# Patient Record
Sex: Female | Born: 1935
Health system: Southern US, Community
[De-identification: ages and names within clinical notes are randomized; demographics above are authoritative.]

## PROBLEM LIST (undated history)

## (undated) DIAGNOSIS — F419 Anxiety disorder, unspecified: Secondary | ICD-10-CM

## (undated) DIAGNOSIS — I10 Essential (primary) hypertension: Secondary | ICD-10-CM

## (undated) DIAGNOSIS — G8929 Other chronic pain: Secondary | ICD-10-CM

## (undated) DIAGNOSIS — Z8619 Personal history of other infectious and parasitic diseases: Secondary | ICD-10-CM

## (undated) DIAGNOSIS — E785 Hyperlipidemia, unspecified: Secondary | ICD-10-CM

## (undated) DIAGNOSIS — M549 Dorsalgia, unspecified: Secondary | ICD-10-CM

## (undated) DIAGNOSIS — Z9289 Personal history of other medical treatment: Secondary | ICD-10-CM

## (undated) DIAGNOSIS — Z8669 Personal history of other diseases of the nervous system and sense organs: Secondary | ICD-10-CM

## (undated) DIAGNOSIS — R351 Nocturia: Secondary | ICD-10-CM

## (undated) DIAGNOSIS — E039 Hypothyroidism, unspecified: Secondary | ICD-10-CM

## (undated) DIAGNOSIS — M1990 Unspecified osteoarthritis, unspecified site: Secondary | ICD-10-CM

## (undated) DIAGNOSIS — M2550 Pain in unspecified joint: Secondary | ICD-10-CM

## (undated) HISTORY — PX: ABDOMINAL HYSTERECTOMY: SHX81

## (undated) HISTORY — PX: BACK SURGERY: SHX140

## (undated) HISTORY — PX: OTHER SURGICAL HISTORY: SHX169

## (undated) HISTORY — PX: COLONOSCOPY: SHX174

---

## 1999-10-31 ENCOUNTER — Encounter: Payer: Self-pay | Admitting: Internal Medicine

## 1999-10-31 ENCOUNTER — Encounter: Admission: RE | Admit: 1999-10-31 | Discharge: 1999-10-31 | Payer: Self-pay | Admitting: Internal Medicine

## 2000-07-17 ENCOUNTER — Encounter: Admission: RE | Admit: 2000-07-17 | Discharge: 2000-09-26 | Payer: Self-pay | Admitting: Anesthesiology

## 2001-05-25 ENCOUNTER — Encounter: Payer: Self-pay | Admitting: Internal Medicine

## 2001-05-25 ENCOUNTER — Encounter: Admission: RE | Admit: 2001-05-25 | Discharge: 2001-05-25 | Payer: Self-pay | Admitting: Internal Medicine

## 2001-06-01 ENCOUNTER — Encounter: Payer: Self-pay | Admitting: Internal Medicine

## 2001-06-01 ENCOUNTER — Encounter: Admission: RE | Admit: 2001-06-01 | Discharge: 2001-06-01 | Payer: Self-pay | Admitting: Internal Medicine

## 2001-11-08 ENCOUNTER — Ambulatory Visit (HOSPITAL_COMMUNITY): Admission: RE | Admit: 2001-11-08 | Discharge: 2001-11-08 | Payer: Self-pay | Admitting: *Deleted

## 2002-06-23 ENCOUNTER — Encounter: Payer: Self-pay | Admitting: Internal Medicine

## 2002-06-23 ENCOUNTER — Encounter: Admission: RE | Admit: 2002-06-23 | Discharge: 2002-06-23 | Payer: Self-pay | Admitting: Internal Medicine

## 2002-10-07 ENCOUNTER — Encounter: Payer: Self-pay | Admitting: Internal Medicine

## 2002-10-07 ENCOUNTER — Encounter: Admission: RE | Admit: 2002-10-07 | Discharge: 2002-10-07 | Payer: Self-pay | Admitting: Internal Medicine

## 2003-01-09 ENCOUNTER — Encounter
Admission: RE | Admit: 2003-01-09 | Discharge: 2003-04-09 | Payer: Self-pay | Admitting: Physical Medicine & Rehabilitation

## 2003-10-11 ENCOUNTER — Encounter: Admission: RE | Admit: 2003-10-11 | Discharge: 2003-10-11 | Payer: Self-pay | Admitting: Internal Medicine

## 2004-10-07 ENCOUNTER — Encounter: Admission: RE | Admit: 2004-10-07 | Discharge: 2004-10-07 | Payer: Self-pay | Admitting: Internal Medicine

## 2004-10-11 ENCOUNTER — Encounter: Admission: RE | Admit: 2004-10-11 | Discharge: 2004-10-11 | Payer: Self-pay | Admitting: Internal Medicine

## 2004-11-05 ENCOUNTER — Encounter: Admission: RE | Admit: 2004-11-05 | Discharge: 2004-11-05 | Payer: Self-pay | Admitting: Obstetrics and Gynecology

## 2005-11-07 ENCOUNTER — Encounter: Admission: RE | Admit: 2005-11-07 | Discharge: 2005-11-07 | Payer: Self-pay | Admitting: Internal Medicine

## 2006-11-11 ENCOUNTER — Encounter: Admission: RE | Admit: 2006-11-11 | Discharge: 2006-11-11 | Payer: Self-pay | Admitting: Internal Medicine

## 2007-11-12 ENCOUNTER — Encounter: Admission: RE | Admit: 2007-11-12 | Discharge: 2007-11-12 | Payer: Self-pay | Admitting: Internal Medicine

## 2008-11-13 ENCOUNTER — Encounter: Admission: RE | Admit: 2008-11-13 | Discharge: 2008-11-13 | Payer: Self-pay | Admitting: Internal Medicine

## 2009-11-13 ENCOUNTER — Encounter: Admission: RE | Admit: 2009-11-13 | Discharge: 2009-11-13 | Payer: Self-pay | Admitting: Internal Medicine

## 2010-06-14 NOTE — Procedures (Signed)
[facility]  Patient:    Valerie Davenport, Valerie Davenport                        MRN: [id number] Proc. Date: 07/21/00 Adm. Date:  [id number] Attending:  Thyra Breed CC:         Tanya Nones. Jeral Fruit, M.D.  Antony Madura, M.D.   Procedure Report  PROCEDURE:  Lumbar epidural steroid injection.  DIAGNOSIS:  L5 radiculopathy with underlying epidural scarring.  ANESTHESIOLOGIST:  Thyra Breed, M.D.  INTERVAL HISTORY:  The patient was seen earlier this week and at that point was uncertain whether she wanted any interventions performed.  She spoke with her sister who is a former patient of mine, and she felt comfortable in returning for an epidural steroid injection today.  She had no questions with regard to potential risk, having had these reviewed at her last visit.  PHYSICAL EXAMINATION:  VITAL SIGNS:  Blood pressure 163/81, heart rate 87, respiratory rate 18, O2 saturation 96%, pain level 3/10.  NEUROLOGIC:  Exam is unchanged from two days ago.  DESCRIPTION OF PROCEDURE:  After informed consent was obtained, the patient was placed in the sitting position and monitored.  Her back was prepped with Betadine x 3.  A skin wheal was raised at the L3-4 interspace with 1% lidocaine.  A 20-gauge Tuohy needle was introduced in the lumbar epidural space to loss of resistance to preservative free normal saline.  There was no CSF nor blood.  The depth was 4.5 cm.  I injected 40 mg of Medrol and 8 ml preservative free normal saline.  The patient tolerated this well.  POSTPROCEDURE CONDITION:  Stable.  DISCHARGE INSTRUCTIONS: 1. Resume previous diet. 2. Limitation of activities per instruction sheet. 3. Continue on current medications. 4. Follow up with me in one to two weeks for repeat injection. DD:  07/21/00 TD:  07/21/00 Job: [id number]

## 2010-06-14 NOTE — Procedures (Signed)
[facility]  Patient:    Valerie Davenport, Valerie Davenport                        MRN: [id number] Proc. Date: 07/28/00 Adm. Date:  [id number] Attending:  Thyra Breed CC:         Antony Madura, M.D.  Stefani Dama, M.D.   Procedure Report  PROCEDURE:  Lumbar epidural steroid injection.  DIAGNOSIS:  L5 radiculopathy.  INTERVAL HISTORY:  The patient has noted overall improvement after her first epidural steroid injections.  She is pleased that she went ahead and proceeded with this intervention.  She is back for her second today.  She has minimal untoward effects to this.  PHYSICAL EXAMINATION:  Blood pressure 152/76, heart rate 79, respiratory rate 16, O2 saturations 97%.  Pain level is 0/10 this morning.  Her back shows good healing.  DESCRIPTION OF PROCEDURE:  After informed consent was obtained, the patient was placed in the sitting position and monitored.  Her back was prepped with Betadine x 3.  A skin wheal was raised at the L3-4 interspace with 1% lidocaine.  A 20 gauge Tuohy needle was introduced to the lumbar epidural space to loss of resistance to preservative-free normal saline.  There was no CSF nor blood.  The depth was 4.5 cm.  I injected 40 mg of Medrol with 8 mL of preservative-free normal saline.  The needle was flushed with preservative-free normal saline and removed intact.  Postprocedure condition - stable.  DISCHARGE INSTRUCTIONS: 1. Resume previous diet. 2. Limitations on activities per instruction sheet. 3. Continue on current medications. 4. Follow up with me in 1-2 weeks for a repeat injection.  She was given a    work excuse for today and tomorrow. DD:  07/28/00 TD:  07/28/00 Job: [id number]

## 2010-06-14 NOTE — H&P (Signed)
[facility]  Patient:    Valerie Davenport, Valerie Davenport                        MRN: [id number] Adm. Date:  [id number] Attending:  Thyra Breed CC:         Stefani Dama, M.D.  Antony Madura, M.D.   History and Physical  NEW PATIENT EVALUATION:  Valerie Davenport is a very pleasant 77 year old, who is sent to [place] by Dr. Barnett Abu for either epidural steroid injections or selective nerve root block for a L5 radiculopathy.  The patient states that she dates her current complaints from about six months ago when she developed left dorsal foot numbness and left hip discomfort.  She was initially seen by Dr. Priscille Kluver who injected her left heel and then her left hip with no sustained improvements about two to three months ago.  She was sent to Dr. Danielle Dess, who apparently had seen her back in 1994, with similar complaints.  He reassessed her MRI that was performed in April, which showed ongoing scarring around the left SI nerve root and some osteophytosis and chronic disk bulge at L5-S1 with some left foraminal stenosis.  He did not feel like he had any surgical options for her and sent her for medical pain management.  The patient complains of numbness and tingling to the left lower extremity, a foot drop which has been present for quite sometime, but intact bowel and bladder control.  She describes her pain as a sense that something is being stuck into her left hip and just numbness out to her lower extremity.  This is made worse by being up on her feet for long periods of time and improved by Tylenol.  She has been treated with Tylenol and the injection therapy.  In the past, when she was seeing Dr. Darrelyn Hillock, who apparently operated on her in 1987, a AFO was avocated, but she apparently has not been consistent about wearing these.  CURRENT MEDICATIONS:  Premarin, Pravachol, enalapril, Norvasc, Levoxyl, and Tylenol.  ALLERGIES:  No known drug allergies.  FAMILY  HISTORY:  Positive for back problems, hypertension, coronary artery disease, and kidney problems.  ACTIVE MEDICAL PROBLEMS:  Hypertension, hypothyroidism, hypercholesterolemia, and history of migraines.  SOCIAL HISTORY:  The patient is a nonsmoker and nondrinker.  She works in [employer].  PAST SURGICAL HISTORY:  Significant for her back surgery.  REVIEW OF SYSTEMS:  GENERAL: Negative.  HEAD: Significant for migraine headaches, with the last one occurring two months ago.  EYES: Significant for corrective lenses.  NOSE/MOUTH/THROAT: Negative.  EARS: Negative.  PULMONARY: Negative.  CARDIOVASCULAR: Significant for hypertension.  GI: Significant for diarrhea.  GU: Urinary frequency.  MUSCULOSKELETAL: See HPI.  NEUROLOGICAL: No history of seizure or stroke.  HEMATOLOGIC: Negative.  CUTANEOUS: Negative. ENDOCRINE: Positive for hypothyroidism, negative for diabetes mellitus. PSYCHIATRIC: Positive for situational stress, otherwise negative. ALLERGY/IMMUNOLOGIC: Positive for hayfever-like symptoms.  PHYSICAL EXAMINATION:  VITAL SIGNS:  Blood pressure 143/68, heart rate 77, respirations 18, O2 saturation 95%.  Pain level is 3/10.  Temperature 97.8.  GENERAL:  This is a pleasant anxious female in no acute distress.  HEENT:  HEAD: Normocephalic, atraumatic.  EYES: Extraocular movements intact with conjunctivae and sclerae clear.  NOSE: Patent.  Nares without discharge. OROPHARYNX: Free of lesions.  NECK:  Supple, without lymphadenopathy.  Carotids were 2+ and symmetric without bruits.  LUNGS:  Clear.  HEART:  Regular rate and rhythm.  BREASTS/ABDOMEN/PELVIC/RECTAL:  Exams  not performed.  BACK:  Examination revealed a well-healed surgical scar with negative straight leg raise signs.  EXTREMITIES:  No cyanosis, clubbing, or edema with radial pulses and dorsalis pedis pulses 2+ and symmetric.  She had bony enlargement of the PIPs, DIPs, and first carpal and metacarpal joints of the  hands, as well as the mid tarsal joints of the feet.  NEUROLOGICAL:  The patient was oriented to person, place, time, and reason for visit.  Cranial nerves 2-12 are grossly intact.  Deep tendon reflexes were symmetric in the upper extremities, symmetric at the knees, 2+ at the right ankle, absent at the left ankle.  Plantar reflexes were downgoing.  Motor was significant for intact bulk and tone and strength, except for the left EHL which was 1/5.  Sensory was significant for attenuated pinprick perception and vibratory sense over the dorsum of the left foot.  IMPRESSION: 1. Lumbar radiculopathy into the left lower extremity with previous history    of hemilaminectomy and underlying epidural scarring around the left S1    nerve root with multilevel degenerative disk disease. 2. Other medical problems per Dr. Su Hilt, which include hypertension,    hypothyroidism, hypercholesterolemia, history of migraine headaches, and    hayfever.  DISPOSITION:  I discussed the potential risks, benefits, and limitations of a lumbar epidural steroid injection, as well as selective nerve root block.  The patient had a great deal of hesitancy with regard to these.  She is looking for guarantees.  I discussed pain management with her briefly, but her pain level at present is about 3/10 and I am not sure that we can improve greatly on this.  She seems very responsive to Tylenol on as needed basis.  I offered to see her back in followup when she can make up her mind whether she is interested in proceeding with interventional or pain management modalities. She will give [place] a call. DD:  07/20/00 TD:  07/20/00 Job: [id number]

## 2010-06-14 NOTE — Procedures (Signed)
[facility]  Patient:    TWILA, RAPPA                        MRN: [id number] Proc. Date: 08/06/00 Adm. Date:  [id number] Attending:  Thyra Breed CC:         Stefani Dama, M.D.  Antony Madura, M.D.   Procedure Report  PREOPERATIVE DIAGNOSIS:  L5 radiculopathy with underlying lumbar degenerative disk disease.  POSTOPERATIVE DIAGNOSIS:  L5 radiculopathy with underlying lumbar degenerative disk disease.  PROCEDURE:  Lumbar epidural steroid injection.  SURGEON:  Thyra Breed, M.D.  INTERVAL HISTORY:  The patient has marked improvement with her back pain overall.  She continues to have some residual foot discomfort, but is very pleased with her progress.  She rates her pain at 0 out of 10.  MEDICATIONS:  Are unchanged.  PHYSICAL EXAMINATION:  Blood pressure 135/90, heart rate 72, respiratory rate is 18, and O2 saturations 97%.  She shows good healing of her back.  DESCRIPTION OF PROCEDURE:  After informed consent was obtained, the patient was placed in the sitting position and monitored.  Her back was prepped with Betadine x 3.  The skin level was raised at the L3-4 interspace with 1% lidocaine.  A 20-gauge Tuohy needle was introduced into the lumbar epidural space with loss of resistance to preservative-free normal saline.  There was no CSF nor blood.  Forty milligrams of Medrol and 8 ml of preservative-free normal saline was gently injected.  The needle was flushed with preservative-free normal saline and removed intact.  POSTPROCEDURE CONDITION:  Stable.  DISCHARGE INSTRUCTIONS: 1. Resume previous diet. 2. Limitation and activities per instruction sheet. 3. Continue on current medications. 4. I plan to see the patient back in follow up in three months for a follow up    evaluation. DD:  08/06/00 TD:  08/06/00 Job: [id number]

## 2010-10-09 ENCOUNTER — Other Ambulatory Visit: Payer: Self-pay | Admitting: Internal Medicine

## 2010-10-09 DIAGNOSIS — Z1231 Encounter for screening mammogram for malignant neoplasm of breast: Secondary | ICD-10-CM

## 2010-11-18 ENCOUNTER — Ambulatory Visit
Admission: RE | Admit: 2010-11-18 | Discharge: 2010-11-18 | Disposition: A | Discharge status: Home / Self Care | Payer: Medicare Other | Source: Ambulatory Visit | Attending: Internal Medicine | Admitting: Internal Medicine

## 2010-11-18 DIAGNOSIS — Z1231 Encounter for screening mammogram for malignant neoplasm of breast: Secondary | ICD-10-CM

## 2010-11-20 ENCOUNTER — Other Ambulatory Visit: Payer: Self-pay | Admitting: Internal Medicine

## 2010-11-20 DIAGNOSIS — R928 Other abnormal and inconclusive findings on diagnostic imaging of breast: Secondary | ICD-10-CM

## 2010-12-06 ENCOUNTER — Ambulatory Visit
Admission: RE | Admit: 2010-12-06 | Discharge: 2010-12-06 | Disposition: A | Discharge status: Home / Self Care | Payer: Medicare Other | Source: Ambulatory Visit | Attending: Internal Medicine | Admitting: Internal Medicine

## 2010-12-06 DIAGNOSIS — R928 Other abnormal and inconclusive findings on diagnostic imaging of breast: Secondary | ICD-10-CM

## 2011-05-02 ENCOUNTER — Other Ambulatory Visit: Payer: Self-pay | Admitting: Internal Medicine

## 2011-05-02 DIAGNOSIS — N6009 Solitary cyst of unspecified breast: Secondary | ICD-10-CM

## 2011-05-30 ENCOUNTER — Other Ambulatory Visit: Payer: Medicare Other

## 2011-06-27 ENCOUNTER — Ambulatory Visit
Admission: RE | Admit: 2011-06-27 | Discharge: 2011-06-27 | Disposition: A | Discharge status: Home / Self Care | Payer: Medicare Other | Source: Ambulatory Visit | Attending: Internal Medicine | Admitting: Internal Medicine

## 2011-06-27 DIAGNOSIS — N6009 Solitary cyst of unspecified breast: Secondary | ICD-10-CM

## 2011-10-20 ENCOUNTER — Other Ambulatory Visit: Payer: Self-pay | Admitting: Internal Medicine

## 2011-10-20 DIAGNOSIS — N6009 Solitary cyst of unspecified breast: Secondary | ICD-10-CM

## 2011-12-03 ENCOUNTER — Other Ambulatory Visit: Payer: Self-pay | Admitting: Internal Medicine

## 2011-12-03 ENCOUNTER — Ambulatory Visit
Admission: RE | Admit: 2011-12-03 | Discharge: 2011-12-03 | Disposition: A | Discharge status: Home / Self Care | Payer: Medicare Other | Source: Ambulatory Visit | Attending: Internal Medicine | Admitting: Internal Medicine

## 2011-12-03 DIAGNOSIS — N6009 Solitary cyst of unspecified breast: Secondary | ICD-10-CM

## 2012-02-11 ENCOUNTER — Other Ambulatory Visit (HOSPITAL_COMMUNITY): Payer: Self-pay | Admitting: Orthopaedic Surgery

## 2012-02-11 NOTE — H&P (Signed)
[facility]   A Division of [facility]   [address], [place], [place] [address] Telephone: [phone]  Fax: [phone]     PATIENT: Valerie Davenport, Valerie Davenport   MR#: [id number]  DOB: [birth year]        CHIEF COMPLAINT:  Back and right leg pain with increasing right leg weakness x4 months.   HISTORY OF PRESENT ILLNESS:   A 79 year old female who has been followed by Dr. Prince Rome for problems with ongoing back pain.  She had previous surgery at the L5-S1 level greater than 18 years ago.  She used to work in [employer], retired at around age 89.  She has had back pain, right leg pain with progressive increased weakness with giving way and falling.  She has fallen 3 times in the last month.  She has been using a cane.  She was treated conservatively.  Initially was on Ultram, then started taking Vicodin and now is using Percocet in order to control her pain.  Epidural steroids have been offered but the patient is deathly afraid and states that she is concerned that the epidural will not help her leg weakness.  She is normally followed by Dr. Burton Apley.    CURRENT MEDICATIONS:  Include Xanax 0.25 mg 1 a day, chlorthalidone 25 mg 1 a day, enalapril 20 mg daily, Percocet 3 tablets daily and p.r.n., levothyroxine 0.075 mg 1 p.o. daily.  She previously was on a Medrol Dosepak but is off that now.  Simvastatin 20 mg daily, 1 Bayer aspirin a day.     PAST MEDICAL/SURGICAL HISTORY:  Previous surgeries include her back surgery, hysterectomy in the distant past.    SOCIAL HISTORY:  The patient is married.  She is here with her husband and also her son.  She does not smoke, does not drink.     FAMILY HISTORY:  Positive for hypertension.   REVIEW OF SYSTEMS:  Fourteen-point review of systems positive for arthritis, hypertension, thyroid condition.    PHYSICAL EXAMINATION:  The patient is alert and oriented.  She is 5 feet, 8 inches, 140 pounds.  Extraocular movements  intact.  She is somewhat hard of hearing.  Pulse is 72 and regular.  Lungs are clear to auscultation.  Heart regular rate and rhythm without murmur or gallop.  Abdomen soft, nontender.  She has mild weakness in her quads.  Slow stance gait.  Adductor is strong.  The EHL, anterior tib, are normal on the right side.  She has residual weakness of EHL and anterior tib, 1/2 to 1 grade on the left, which has been present now for 18+ years, since her previous surgery.  Gastroc soleus is strong.  No weakness of the hip flexor.  The patient has a midline incision at 5-1.  No rash over her lumbar region.  Pelvis is level.  Good range of motion of her hips and knees without any effusion.     RADIOGRAPHS/TESTS:  MRI scan is reviewed, which shows HNP, L4-5, with cephalad migration and dural compression, which is on the right side.  She has multifactorial moderate stenosis at this level with a moderate to large sized HNP extruded fragment cephalad.     PLAN:  Due to her progressive weakness, failure of treatment, using cane, increasing narcotic pain medication, which is causing some side effects, failure to respond to Medrol prednisone and with increasing leg weakness and falling, she states she would like to proceed with operative intervention.  The plan would  be a right L4 hemilaminectomy and micro-diskectomy and removal of the free fragment.  She would be in the hospital overnight.  We discussed risks of surgery including spinal fluid leakage, dural tear, recurrent disk rupture.  Procedure discussed, all questions answered.  If the opposite left side appeared tighter, then we could do a complete laminectomy if needed.  All questions answered.     For additional information please see handwritten notes, reports, orders and prescriptions in this chart.      Mark C. Ophelia Charter, M.D.    Auto-Authenticated by Veverly Fells. Ophelia Charter, M.D.

## 2012-02-16 ENCOUNTER — Other Ambulatory Visit (HOSPITAL_COMMUNITY): Payer: Self-pay | Admitting: Orthopaedic Surgery

## 2012-02-18 ENCOUNTER — Encounter (HOSPITAL_COMMUNITY)
Admission: RE | Admit: 2012-02-18 | Discharge: 2012-02-18 | Disposition: A | Discharge status: Home / Self Care | Payer: Medicare Other | Source: Ambulatory Visit | Attending: Orthopaedic Surgery | Admitting: Orthopaedic Surgery

## 2012-02-18 ENCOUNTER — Encounter (HOSPITAL_COMMUNITY): Payer: Self-pay

## 2012-02-18 ENCOUNTER — Encounter (HOSPITAL_COMMUNITY): Payer: Self-pay | Admitting: Pharmacy Technician

## 2012-02-18 HISTORY — DX: Anxiety disorder, unspecified: F41.9

## 2012-02-18 HISTORY — DX: Essential (primary) hypertension: I10

## 2012-02-18 HISTORY — DX: Personal history of other diseases of the nervous system and sense organs: Z86.69

## 2012-02-18 HISTORY — DX: Personal history of other medical treatment: Z92.89

## 2012-02-18 HISTORY — DX: Pain in unspecified joint: M25.50

## 2012-02-18 HISTORY — DX: Nocturia: R35.1

## 2012-02-18 HISTORY — DX: Other chronic pain: G89.29

## 2012-02-18 HISTORY — DX: Hypothyroidism, unspecified: E03.9

## 2012-02-18 HISTORY — DX: Personal history of other infectious and parasitic diseases: Z86.19

## 2012-02-18 HISTORY — DX: Hyperlipidemia, unspecified: E78.5

## 2012-02-18 HISTORY — DX: Unspecified osteoarthritis, unspecified site: M19.90

## 2012-02-18 HISTORY — DX: Dorsalgia, unspecified: M54.9

## 2012-02-18 LAB — CBC
MCV: 91.2 fL (ref 78.0–100.0)
Platelets: 251 10*3/uL (ref 150–400)
RBC: 4.78 MIL/uL (ref 3.87–5.11)
WBC: 9.9 10*3/uL (ref 4.0–10.5)

## 2012-02-18 LAB — URINE MICROSCOPIC-ADD ON

## 2012-02-18 LAB — URINALYSIS, ROUTINE W REFLEX MICROSCOPIC
Glucose, UA: NEGATIVE mg/dL
Ketones, ur: NEGATIVE mg/dL
pH: 5.5 (ref 5.0–8.0)

## 2012-02-18 LAB — COMPREHENSIVE METABOLIC PANEL
ALT: 17 U/L (ref 0–35)
AST: 24 U/L (ref 0–37)
CO2: 27 mEq/L (ref 19–32)
Chloride: 98 mEq/L (ref 96–112)
GFR calc non Af Amer: 46 mL/min — ABNORMAL LOW (ref >90)
Sodium: 138 mEq/L (ref 135–145)
Total Bilirubin: 0.3 mg/dL (ref 0.3–1.2)

## 2012-02-18 NOTE — Pre-Procedure Instructions (Signed)
Valerie Davenport  02/18/2012   Your procedure is scheduled on:  Fri, Jan 24 @ 7:30 AM  Report to [facility] at 5:30 AM.  Call this number if you have problems the morning of surgery: [phone]   Remember:   Do not eat food or drink liquids after midnight.   Take these medicines the morning of surgery with A SIP OF WATER: Synthroid(Levothyroxine) and Pain Pill(if needed)  Do not wear jewelry, make-up or nail polish.  Do not wear lotions, powders, or perfumes. You may wear deodorant.  Do not shave 48 hours prior to surgery.   Do not bring valuables to the hospital.  Contacts, dentures or bridgework may not be worn into surgery.  Leave suitcase in the car. After surgery it may be brought to your room.  For patients admitted to the hospital, checkout time is 11:00 AM the day of  discharge.   Patients discharged the day of surgery will not be allowed to drive  home.    Special Instructions: Shower using CHG 2 nights before surgery and the night before surgery.  If you shower the day of surgery use CHG.  Use special wash - you have one bottle of CHG for all showers.  You should use approximately 1/3 of the bottle for each shower.   Please read over the following fact sheets that you were given: Pain Booklet, Coughing and Deep Breathing, MRSA Information and Surgical Site Infection Prevention

## 2012-02-18 NOTE — Progress Notes (Signed)
Pt doesn't have a cardiologist  Stress test done 2-66yrs ago at Dr.Ronald Roberts-to request report  Denies ever having an echo or heart cath  Dr.Ronald Su Hilt is Medical MD   Denies EKG or CXR being done within past yr

## 2012-02-19 MED ORDER — CEFAZOLIN SODIUM-DEXTROSE 2-3 GM-% IV SOLR
2.0000 g | INTRAVENOUS | Status: AC
  Administered 2012-02-20: 2 g via INTRAVENOUS
  Filled 2012-02-19: qty 50

## 2012-02-19 NOTE — Progress Notes (Signed)
0840   PCP  Office is faxing stress test & last office note to [phone] !!!   DA

## 2012-02-20 ENCOUNTER — Encounter (HOSPITAL_COMMUNITY): Payer: Self-pay | Admitting: General Practice

## 2012-02-20 ENCOUNTER — Observation Stay (HOSPITAL_COMMUNITY)
Admission: RE | Admit: 2012-02-20 | Discharge: 2012-02-21 | Disposition: A | Discharge status: Home / Self Care | Payer: Medicare Other | Source: Ambulatory Visit | Attending: Orthopaedic Surgery | Admitting: Orthopaedic Surgery

## 2012-02-20 ENCOUNTER — Encounter (HOSPITAL_COMMUNITY): Payer: Self-pay | Admitting: Anesthesiology

## 2012-02-20 ENCOUNTER — Inpatient Hospital Stay (HOSPITAL_COMMUNITY): Payer: Medicare Other | Admitting: Anesthesiology

## 2012-02-20 ENCOUNTER — Encounter (HOSPITAL_COMMUNITY)
Admission: RE | Disposition: A | Discharge status: Home / Self Care | Payer: Self-pay | Source: Ambulatory Visit | Attending: Orthopaedic Surgery

## 2012-02-20 ENCOUNTER — Encounter (HOSPITAL_COMMUNITY): Payer: Self-pay

## 2012-02-20 ENCOUNTER — Inpatient Hospital Stay (HOSPITAL_COMMUNITY): Payer: Medicare Other

## 2012-02-20 DIAGNOSIS — K219 Gastro-esophageal reflux disease without esophagitis: Secondary | ICD-10-CM | POA: Insufficient documentation

## 2012-02-20 DIAGNOSIS — F411 Generalized anxiety disorder: Secondary | ICD-10-CM | POA: Insufficient documentation

## 2012-02-20 DIAGNOSIS — Z01818 Encounter for other preprocedural examination: Secondary | ICD-10-CM | POA: Insufficient documentation

## 2012-02-20 DIAGNOSIS — Z01812 Encounter for preprocedural laboratory examination: Secondary | ICD-10-CM | POA: Insufficient documentation

## 2012-02-20 DIAGNOSIS — Z01810 Encounter for preprocedural cardiovascular examination: Secondary | ICD-10-CM | POA: Insufficient documentation

## 2012-02-20 DIAGNOSIS — I10 Essential (primary) hypertension: Secondary | ICD-10-CM | POA: Diagnosis not present

## 2012-02-20 DIAGNOSIS — M5126 Other intervertebral disc displacement, lumbar region: Secondary | ICD-10-CM | POA: Diagnosis not present

## 2012-02-20 DIAGNOSIS — E039 Hypothyroidism, unspecified: Secondary | ICD-10-CM | POA: Insufficient documentation

## 2012-02-20 HISTORY — PX: LUMBAR LAMINECTOMY: SHX95

## 2012-02-20 LAB — URINE CULTURE

## 2012-02-20 SURGERY — MICRODISCECTOMY LUMBAR LAMINECTOMY
Anesthesia: General | Site: Back | Wound class: Clean

## 2012-02-20 MED ORDER — FLEET ENEMA 7-19 GM/118ML RE ENEM: 1.0000 | ENEMA | Freq: Once | RECTAL | Status: AC | PRN

## 2012-02-20 MED ORDER — 0.9 % SODIUM CHLORIDE (POUR BTL) OPTIME
TOPICAL | Status: DC | PRN
  Administered 2012-02-20: 1000 mL

## 2012-02-20 MED ORDER — HYDROCODONE-ACETAMINOPHEN 5-325 MG PO TABS: 1.0000 | ORAL_TABLET | ORAL | Status: DC | PRN

## 2012-02-20 MED ORDER — SODIUM CHLORIDE 0.9 % IJ SOLN: 3.0000 mL | INTRAMUSCULAR | Status: DC | PRN

## 2012-02-20 MED ORDER — SODIUM CHLORIDE 0.9 % IV SOLN: 250.0000 mL | INTRAVENOUS | Status: DC

## 2012-02-20 MED ORDER — ACETAMINOPHEN 325 MG PO TABS: 650.0000 mg | ORAL_TABLET | ORAL | Status: DC | PRN

## 2012-02-20 MED ORDER — FENTANYL CITRATE 0.05 MG/ML IJ SOLN
INTRAMUSCULAR | Status: DC | PRN
  Administered 2012-02-20: 50 ug via INTRAVENOUS
  Administered 2012-02-20: 100 ug via INTRAVENOUS

## 2012-02-20 MED ORDER — ROCURONIUM BROMIDE 100 MG/10ML IV SOLN
INTRAVENOUS | Status: DC | PRN
  Administered 2012-02-20: 40 mg via INTRAVENOUS

## 2012-02-20 MED ORDER — ACETAMINOPHEN 650 MG RE SUPP: 650.0000 mg | RECTAL | Status: DC | PRN

## 2012-02-20 MED ORDER — LACTATED RINGERS IV SOLN
INTRAVENOUS | Status: DC | PRN
  Administered 2012-02-20 (×2): via INTRAVENOUS

## 2012-02-20 MED ORDER — PANTOPRAZOLE SODIUM 40 MG IV SOLR
40.0000 mg | Freq: Every day | INTRAVENOUS | Status: DC
  Filled 2012-02-20: qty 40

## 2012-02-20 MED ORDER — METHOCARBAMOL 100 MG/ML IJ SOLN
500.0000 mg | Freq: Four times a day (QID) | INTRAVENOUS | Status: DC | PRN
  Filled 2012-02-20: qty 5

## 2012-02-20 MED ORDER — BISACODYL 10 MG RE SUPP: 10.0000 mg | Freq: Every day | RECTAL | Status: DC | PRN

## 2012-02-20 MED ORDER — OXYCODONE-ACETAMINOPHEN 5-325 MG PO TABS
ORAL_TABLET | ORAL | Status: AC
  Filled 2012-02-20: qty 2

## 2012-02-20 MED ORDER — LEVOTHYROXINE SODIUM 75 MCG PO TABS
75.0000 ug | ORAL_TABLET | Freq: Every day | ORAL | Status: DC
  Administered 2012-02-21: 75 ug via ORAL
  Filled 2012-02-20 (×2): qty 1

## 2012-02-20 MED ORDER — ONDANSETRON HCL 4 MG/2ML IJ SOLN: 4.0000 mg | INTRAMUSCULAR | Status: DC | PRN

## 2012-02-20 MED ORDER — ONDANSETRON HCL 4 MG/2ML IJ SOLN
INTRAMUSCULAR | Status: DC | PRN
  Administered 2012-02-20: 4 mg via INTRAVENOUS

## 2012-02-20 MED ORDER — METHOCARBAMOL 500 MG PO TABS
ORAL_TABLET | ORAL | Status: AC
  Filled 2012-02-20: qty 1

## 2012-02-20 MED ORDER — CHLORTHALIDONE 25 MG PO TABS
25.0000 mg | ORAL_TABLET | Freq: Every day | ORAL | Status: DC
  Administered 2012-02-21: 25 mg via ORAL
  Filled 2012-02-20 (×2): qty 1

## 2012-02-20 MED ORDER — CEFAZOLIN SODIUM 1-5 GM-% IV SOLN
1.0000 g | Freq: Three times a day (TID) | INTRAVENOUS | Status: AC
  Administered 2012-02-20 (×2): 1 g via INTRAVENOUS
  Filled 2012-02-20 (×2): qty 50

## 2012-02-20 MED ORDER — DOCUSATE SODIUM 100 MG PO CAPS
100.0000 mg | ORAL_CAPSULE | Freq: Two times a day (BID) | ORAL | Status: DC
  Administered 2012-02-20 – 2012-02-21 (×2): 100 mg via ORAL
  Filled 2012-02-20 (×3): qty 1

## 2012-02-20 MED ORDER — KCL IN DEXTROSE-NACL 20-5-0.45 MEQ/L-%-% IV SOLN
INTRAVENOUS | Status: DC
  Administered 2012-02-20: 11:00:00 via INTRAVENOUS
  Filled 2012-02-20 (×3): qty 1000

## 2012-02-20 MED ORDER — PHENYLEPHRINE HCL 10 MG/ML IJ SOLN
INTRAMUSCULAR | Status: DC | PRN
  Administered 2012-02-20 (×4): 80 ug via INTRAVENOUS

## 2012-02-20 MED ORDER — LIDOCAINE HCL (CARDIAC) 20 MG/ML IV SOLN
INTRAVENOUS | Status: DC | PRN
  Administered 2012-02-20: 70 mg via INTRAVENOUS

## 2012-02-20 MED ORDER — HYDROCODONE-ACETAMINOPHEN 5-325 MG PO TABS
1.0000 | ORAL_TABLET | ORAL | Status: DC | PRN
  Administered 2012-02-20: 1 via ORAL
  Filled 2012-02-20: qty 1

## 2012-02-20 MED ORDER — SODIUM CHLORIDE 0.9 % IJ SOLN
3.0000 mL | Freq: Two times a day (BID) | INTRAMUSCULAR | Status: DC
  Administered 2012-02-21: 3 mL via INTRAVENOUS

## 2012-02-20 MED ORDER — ASPIRIN EC 81 MG PO TBEC
81.0000 mg | DELAYED_RELEASE_TABLET | Freq: Every day | ORAL | Status: DC
  Administered 2012-02-21: 81 mg via ORAL
  Filled 2012-02-20: qty 1

## 2012-02-20 MED ORDER — ENALAPRIL MALEATE 20 MG PO TABS
20.0000 mg | ORAL_TABLET | Freq: Every day | ORAL | Status: DC
  Administered 2012-02-21: 20 mg via ORAL
  Filled 2012-02-20 (×2): qty 1

## 2012-02-20 MED ORDER — OXYCODONE-ACETAMINOPHEN 5-325 MG PO TABS
1.0000 | ORAL_TABLET | ORAL | Status: DC | PRN
Start: 2012-02-20 — End: 2012-02-21
  Administered 2012-02-20 (×2): 2 via ORAL
  Administered 2012-02-21: 1 via ORAL
  Filled 2012-02-20: qty 2
  Filled 2012-02-20: qty 1

## 2012-02-20 MED ORDER — MORPHINE SULFATE 2 MG/ML IJ SOLN
INTRAMUSCULAR | Status: AC
  Filled 2012-02-20: qty 1

## 2012-02-20 MED ORDER — METHOCARBAMOL 500 MG PO TABS
500.0000 mg | ORAL_TABLET | Freq: Four times a day (QID) | ORAL | Status: DC | PRN
  Administered 2012-02-20 – 2012-02-21 (×2): 500 mg via ORAL
  Filled 2012-02-20: qty 1

## 2012-02-20 MED ORDER — MIDAZOLAM HCL 5 MG/5ML IJ SOLN
INTRAMUSCULAR | Status: DC | PRN
  Administered 2012-02-20: 1 mg via INTRAVENOUS

## 2012-02-20 MED ORDER — SIMVASTATIN 20 MG PO TABS
20.0000 mg | ORAL_TABLET | Freq: Every evening | ORAL | Status: DC
  Administered 2012-02-20: 20 mg via ORAL
  Filled 2012-02-20 (×2): qty 1

## 2012-02-20 MED ORDER — KETOROLAC TROMETHAMINE 30 MG/ML IJ SOLN: 30.0000 mg | Freq: Once | INTRAMUSCULAR | Status: DC

## 2012-02-20 MED ORDER — HYDROMORPHONE HCL PF 1 MG/ML IJ SOLN: 0.2500 mg | INTRAMUSCULAR | Status: DC | PRN

## 2012-02-20 MED ORDER — PANTOPRAZOLE SODIUM 40 MG PO TBEC
40.0000 mg | DELAYED_RELEASE_TABLET | Freq: Every day | ORAL | Status: DC
  Administered 2012-02-20 – 2012-02-21 (×2): 40 mg via ORAL
  Filled 2012-02-20 (×2): qty 1

## 2012-02-20 MED ORDER — PHENOL 1.4 % MT LIQD: 1.0000 | OROMUCOSAL | Status: DC | PRN

## 2012-02-20 MED ORDER — MORPHINE SULFATE 2 MG/ML IJ SOLN: 1.0000 mg | INTRAMUSCULAR | Status: DC | PRN

## 2012-02-20 MED ORDER — MENTHOL 3 MG MT LOZG: 1.0000 | LOZENGE | OROMUCOSAL | Status: DC | PRN

## 2012-02-20 MED ORDER — NEOSTIGMINE METHYLSULFATE 1 MG/ML IJ SOLN
INTRAMUSCULAR | Status: DC | PRN
  Administered 2012-02-20: 3 mg via INTRAVENOUS

## 2012-02-20 MED ORDER — PROPOFOL 10 MG/ML IV BOLUS
INTRAVENOUS | Status: DC | PRN
  Administered 2012-02-20: 170 mg via INTRAVENOUS

## 2012-02-20 MED ORDER — GLYCOPYRROLATE 0.2 MG/ML IJ SOLN
INTRAMUSCULAR | Status: DC | PRN
  Administered 2012-02-20: 0.4 mg via INTRAVENOUS

## 2012-02-20 MED ORDER — SENNOSIDES-DOCUSATE SODIUM 8.6-50 MG PO TABS: 1.0000 | ORAL_TABLET | Freq: Every evening | ORAL | Status: DC | PRN

## 2012-02-20 SURGICAL SUPPLY — 46 items
BENZOIN TINCTURE PRP APPL 2/3 (GAUZE/BANDAGES/DRESSINGS) ×2 IMPLANT
BUR ROUND FLUTED 4 SOFT TCH (BURR) IMPLANT
CLOTH BEACON ORANGE TIMEOUT ST (SAFETY) ×2 IMPLANT
CORDS BIPOLAR (ELECTRODE) ×2 IMPLANT
COVER SURGICAL LIGHT HANDLE (MISCELLANEOUS) ×2 IMPLANT
DERMABOND ADHESIVE PROPEN (GAUZE/BANDAGES/DRESSINGS) ×1
DERMABOND ADVANCED .7 DNX6 (GAUZE/BANDAGES/DRESSINGS) ×1 IMPLANT
DRAPE MICROSCOPE LEICA (MISCELLANEOUS) ×2 IMPLANT
DRAPE PROXIMA HALF (DRAPES) ×4 IMPLANT
DRSG EMULSION OIL 3X3 NADH (GAUZE/BANDAGES/DRESSINGS) ×2 IMPLANT
DRSG MEPILEX BORDER 4X8 (GAUZE/BANDAGES/DRESSINGS) ×2 IMPLANT
DURAPREP 26ML APPLICATOR (WOUND CARE) ×2 IMPLANT
ELECT REM PT RETURN 9FT ADLT (ELECTROSURGICAL) ×2
ELECTRODE REM PT RTRN 9FT ADLT (ELECTROSURGICAL) ×1 IMPLANT
GLOVE BIOGEL PI IND STRL 7.5 (GLOVE) ×1 IMPLANT
GLOVE BIOGEL PI IND STRL 8 (GLOVE) ×1 IMPLANT
GLOVE BIOGEL PI INDICATOR 7.5 (GLOVE) ×1
GLOVE BIOGEL PI INDICATOR 8 (GLOVE) ×1
GLOVE ECLIPSE 7.0 STRL STRAW (GLOVE) ×2 IMPLANT
GLOVE ORTHO TXT STRL SZ7.5 (GLOVE) ×2 IMPLANT
GOWN PREVENTION PLUS LG XLONG (DISPOSABLE) IMPLANT
GOWN STRL NON-REIN LRG LVL3 (GOWN DISPOSABLE) ×6 IMPLANT
KIT BASIN OR (CUSTOM PROCEDURE TRAY) ×2 IMPLANT
KIT ROOM TURNOVER OR (KITS) ×2 IMPLANT
MANIFOLD NEPTUNE II (INSTRUMENTS) ×2 IMPLANT
NDL SUT .5 MAYO 1.404X.05X (NEEDLE) ×1 IMPLANT
NEEDLE HYPO 25GX1X1/2 BEV (NEEDLE) ×2 IMPLANT
NEEDLE MAYO TAPER (NEEDLE) ×1
NEEDLE SPNL 18GX3.5 QUINCKE PK (NEEDLE) ×2 IMPLANT
NS IRRIG 1000ML POUR BTL (IV SOLUTION) ×2 IMPLANT
PACK LAMINECTOMY ORTHO (CUSTOM PROCEDURE TRAY) ×2 IMPLANT
PAD ARMBOARD 7.5X6 YLW CONV (MISCELLANEOUS) ×4 IMPLANT
PATTIES SURGICAL .5 X.5 (GAUZE/BANDAGES/DRESSINGS) ×2 IMPLANT
PATTIES SURGICAL .75X.75 (GAUZE/BANDAGES/DRESSINGS) IMPLANT
SPONGE GAUZE 4X4 12PLY (GAUZE/BANDAGES/DRESSINGS) ×2 IMPLANT
STRIP CLOSURE SKIN 1/2X4 (GAUZE/BANDAGES/DRESSINGS) IMPLANT
SUT VIC AB 2-0 CT1 27 (SUTURE) ×1
SUT VIC AB 2-0 CT1 TAPERPNT 27 (SUTURE) ×1 IMPLANT
SUT VICRYL 0 TIES 12 18 (SUTURE) ×2 IMPLANT
SUT VICRYL 4-0 PS2 18IN ABS (SUTURE) IMPLANT
SUT VICRYL AB 2 0 TIES (SUTURE) ×2 IMPLANT
SYR 20ML ECCENTRIC (SYRINGE) IMPLANT
SYR CONTROL 10ML LL (SYRINGE) ×2 IMPLANT
TOWEL OR 17X24 6PK STRL BLUE (TOWEL DISPOSABLE) ×2 IMPLANT
TOWEL OR 17X26 10 PK STRL BLUE (TOWEL DISPOSABLE) ×2 IMPLANT
WATER STERILE IRR 1000ML POUR (IV SOLUTION) ×2 IMPLANT

## 2012-02-20 NOTE — Interval H&P Note (Signed)
History and Physical Interval Note:  02/20/2012 7:11 AM  Valerie Davenport  has presented today for surgery, with the diagnosis of Right L4-5 HNP with free fragment  The various methods of treatment have been discussed with the patient and family. After consideration of risks, benefits and other options for treatment, the patient has consented to  Procedure(s) (LRB) with comments: MICRODISCECTOMY LUMBAR LAMINECTOMY (N/A) - Right L4 hemilaminectomy, microdiscectomy, removal of free fragment as a surgical intervention .  The patient's history has been reviewed, patient examined, no change in status, stable for surgery.  I have reviewed the patient's chart and labs.  Questions were answered to the patient's satisfaction.     Domique Clapper C

## 2012-02-20 NOTE — Transfer of Care (Signed)
Immediate Anesthesia Transfer of Care Note  Patient: Valerie Davenport  Procedure(s) Performed: Procedure(s) (LRB) with comments: MICRODISCECTOMY LUMBAR LAMINECTOMY (N/A) - Right L4 hemilaminectomy, microdiscectomy, removal of free fragment  Patient Location: PACU  Anesthesia Type:General  Level of Consciousness: awake, alert  and patient cooperative  Airway & Oxygen Therapy: Patient Spontanous Breathing and Patient connected to nasal cannula oxygen  Post-op Assessment: Report given to PACU RN, Post -op Vital signs reviewed and stable and Patient moving all extremities X 4  Post vital signs: Reviewed and stable  Complications: No apparent anesthesia complications

## 2012-02-20 NOTE — Anesthesia Preprocedure Evaluation (Addendum)
Anesthesia Evaluation  Patient identified by MRN, date of birth, ID band Patient awake    Reviewed: Allergy & Precautions, H&P , NPO status , Patient's Chart, lab work & pertinent test results  History of Anesthesia Complications Negative for: history of anesthetic complications  Airway Mallampati: II      Dental   Pulmonary  breath sounds clear to auscultation        Cardiovascular hypertension, Pt. on medications Rhythm:Regular Rate:Normal     Neuro/Psych Anxiety    GI/Hepatic GERD-  Controlled,  Endo/Other  Hypothyroidism   Renal/GU      Musculoskeletal   Abdominal   Peds  Hematology   Anesthesia Other Findings   Reproductive/Obstetrics                         Anesthesia Physical Anesthesia Plan  ASA: III  Anesthesia Plan: General   Post-op Pain Management:    Induction: Intravenous  Airway Management Planned: Oral ETT  Additional Equipment:   Intra-op Plan:   Post-operative Plan: Extubation in OR  Informed Consent: I have reviewed the patients History and Physical, chart, labs and discussed the procedure including the risks, benefits and alternatives for the proposed anesthesia with the patient or authorized representative who has indicated his/her understanding and acceptance.   Dental advisory given  Plan Discussed with: CRNA, Surgeon and Anesthesiologist  Anesthesia Plan Comments:       Anesthesia Quick Evaluation

## 2012-02-20 NOTE — Op Note (Signed)
NAMEAIKA, Valerie Davenport                 ACCOUNT NO.:  [id number]  MEDICAL RECORD NO.:  [id number]  LOCATION:  5N04C                        FACILITY:  [facility]  PHYSICIAN:  Aleesia Henney C. Ophelia Charter, M.D.    DATE OF BIRTH:  06/14/1931  DATE OF PROCEDURE:  02/20/2012 DATE OF DISCHARGE:                              OPERATIVE REPORT   PREOPERATIVE DIAGNOSIS:  Right L4-5 herniated nucleus pulposus with cephalad migration and free fragment.  POSTOPERATIVE DIAGNOSIS:  Right L4-5 herniated nucleus pulposus with cephalad migration and free fragment.  PROCEDURE:  Right L4 hemilaminectomy, right L4-5 microdiskectomy, and removal of free fragment.  SURGEON:  Yacoub Diltz C. Ophelia Charter, MD  ASSISTANT:  [name], PA-C, medically necessary and present for the entire procedure.  ANESTHESIA:  GOT plus Marcaine local.  COMPLICATIONS:  None.  INDICATIONS:  This patient has had persistent problems with back pain, failed conservative treatment.  MRI scan shows a large fragment that had ruptured and migrated cephalad into the lateral recess underneath the nerve root causing compression against the pedicle at L4.  The patient had quad weakness and giving way with the falling.  PROCEDURE IN DETAIL:  After induction of general anesthesia, the patient was placed prone, 2 g Ancef was given prophylactically.  Back was prepped with DuraPrep.  Careful padding to the ulnar nerve, shoulder region.  10-15 drape was placed over the sacrum transversely and back was prepped with DuraPrep, they were escorted with towels, sterile skin marker was used on her old incision at L5-S1 and Betadine Steri-Drape applied.  Laminectomy sheet was applied.  Needles were placed at the planned level or decompression, trying to place them just below the L4-5 disk space and deepened to the top portion of the pedicle of L4.  X-ray was taken.  Needles were adjusted.  Second x-ray was taken.  Skin was marked, and after time-out procedure, incision was  made.  Subperiosteal dissection out laterally with Taylor retractor placed and then Kocher clamps placed, adjusted, repeat x-ray taken.  Inferior Kocher clamp was just barely above the disk space at 4-5, that was lowered slightly more caudally and then a second x-ray was taken.  Lamina was __________ bur. Once the x-ray confirmed appropriate level for decompression.  Starting at the inferior aspect of L4 lamina, hemilaminectomy was performed using the bur to thin the lamina and then removing it with 2 and 3 mm Kerrison with operative microscope, draped and brought in.  Thick chunks of ligament were removed.  4-5 disk space was noted, annulus was incised. There was some ligament elevated cephalad and medially above this was a large free fragment causing significant compression.  About half of it was removed initially, being teased out with a ball-tipped nerve hook and removed with the micropituitary.  Additional chunks were removed until there was full decompression.  The posterior aspect of L4 vertebrae body was visualized, lateral wall pedicle was visualized, nerve root coming underneath the pedicle was visualized, and there was no compression anterior to the nerve root after removal of the fragment. Passes were made with micropituitary up and down.  There was minimal disk material left in the disk space that was loose.  Passes anterior  to the dura were made with the hockey stick, dural separator and there were no areas of compression either laterally in the lateral recess up to foramina.  After irrigation with saline solution, deep fascia was closed with #1 Vicryl, 2-0 Vicryl subcutaneous tissue, subcuticular skin closure, postop dressing, and then transferred to recovery room.  The patient tolerated the procedure well.  Wound was irrigated copiously at the end of the case prior to closure.     Marilea Gwynne C. Ophelia Charter, M.D.     MCY/MEDQ  D:  02/20/2012  T:  02/20/2012  Job:  [id number]

## 2012-02-20 NOTE — Progress Notes (Signed)
Pharmacy tech calling pharmacy to complete med rec.

## 2012-02-20 NOTE — Brief Op Note (Cosign Needed)
02/20/2012  8:57 AM  PATIENT:  Valerie Davenport  79 y.o. female  PRE-OPERATIVE DIAGNOSIS:  Right L4-5 HNP with free fragment  POST-OPERATIVE DIAGNOSIS:  Right L4-5 HNP with free fragment  PROCEDURE:  Procedure(s) (LRB) with comments: MICRODISCECTOMY LUMBAR LAMINECTOMY (N/A) - Right L4 hemilaminectomy, microdiscectomy, removal of free fragment  SURGEON:  Surgeon(s) and Role:    * Eldred Manges, MD - Primary  PHYSICIAN ASSISTANT: [name] PAC  ASSISTANTS: none   ANESTHESIA:   general  EBL:  Total I/O In: 1000 [I.V.:1000] Out: -   BLOOD ADMINISTERED:none  DRAINS: none   LOCAL MEDICATIONS USED:  NONE  SPECIMEN:  No Specimen  DISPOSITION OF SPECIMEN:  N/A  COUNTS:  YES  TOURNIQUET:  * No tourniquets in log *  DICTATION: .Note written in EPIC  PLAN OF CARE: Admit for overnight observation  PATIENT DISPOSITION:  PACU - hemodynamically stable.   Delay start of Pharmacological VTE agent (>24hrs) due to surgical blood loss or risk of bleeding: yes

## 2012-02-20 NOTE — Anesthesia Postprocedure Evaluation (Signed)
  Anesthesia Post-op Note  Patient: Valerie Davenport  Procedure(s) Performed: Procedure(s) (LRB) with comments: MICRODISCECTOMY LUMBAR LAMINECTOMY (N/A) - Right L4 hemilaminectomy, microdiscectomy, removal of free fragment  Patient Location: PACU  Anesthesia Type:General  Level of Consciousness: awake  Airway and Oxygen Therapy: Patient Spontanous Breathing  Post-op Pain: mild  Post-op Assessment: Post-op Vital signs reviewed  Post-op Vital Signs: Reviewed  Complications: No apparent anesthesia complications

## 2012-02-21 MED ORDER — WHITE PETROLATUM GEL
Status: AC
  Administered 2012-02-21: 09:00:00
  Filled 2012-02-21: qty 5

## 2012-02-21 NOTE — Discharge Summary (Signed)
Physician Discharge Summary  Patient ID: Valerie Davenport MRN: [id number] DOB/AGE: Sep 09, 1931 79 y.o.  Admit date: 02/20/2012 Discharge date: 02/21/2012  Admission Diagnoses:L4-5 HNP with free fragment and radiculopathy  Discharge Diagnoses: same Principal Problem:  *HNP (herniated nucleus pulposus), lumbar   Discharged Condition: good  Hospital Course: underwent right L4 hemilaminectomy , microdiscectomy and removal of HNP free fragment with complete relief of leg pain and weakness.   Consults: None  Significant Diagnostic Studies:   Treatments: surgery: above  Discharge Exam: Blood pressure 148/72, pulse 80, temperature 97.1 F (36.2 C), temperature source Oral, resp. rate 20, SpO2 100.00%. Incision/Wound: clean and dry  Disposition: Final discharge disposition not confirmed     Medication List     As of 02/21/2012 11:32 AM    TAKE these medications         aspirin EC 81 MG tablet   Take 81 mg by mouth daily.      chlorthalidone 25 MG tablet   Commonly known as: HYGROTON   Take 25 mg by mouth daily.      enalapril 20 MG tablet   Commonly known as: VASOTEC   Take 20 mg by mouth daily.      HYDROcodone-acetaminophen 5-325 MG per tablet   Commonly known as: NORCO/VICODIN   Take 1-2 tablets by mouth every 4 (four) hours as needed.      levothyroxine 75 MCG tablet   Commonly known as: SYNTHROID, LEVOTHROID   Take 75 mcg by mouth daily.      simvastatin 20 MG tablet   Commonly known as: ZOCOR   Take 20 mg by mouth every evening.           Follow-up Information    Follow up with Eldred Manges, MD. Schedule an appointment as soon as possible for a visit in 2 weeks.   Contact information:   [address] [place] [address] [phone]          Signed: Eldred Manges 02/21/2012, 11:32 AM

## 2012-02-21 NOTE — Care Management Utilization Note (Signed)
UR completed 

## 2012-02-21 NOTE — Progress Notes (Signed)
NCM spoke to pt and explained DME will be delivered to her room prior to discharge. States no other needs at this time. No HH at this time. Isidoro Donning RN CCM Case Mgmt phone [phone]

## 2012-02-23 ENCOUNTER — Encounter (HOSPITAL_COMMUNITY): Payer: Self-pay | Admitting: Orthopaedic Surgery

## 2012-12-06 ENCOUNTER — Other Ambulatory Visit: Payer: Self-pay

## 2012-12-06 DIAGNOSIS — Z1231 Encounter for screening mammogram for malignant neoplasm of breast: Secondary | ICD-10-CM

## 2013-01-06 ENCOUNTER — Ambulatory Visit: Payer: Medicare Other

## 2013-01-17 ENCOUNTER — Ambulatory Visit
Admission: RE | Admit: 2013-01-17 | Discharge: 2013-01-17 | Disposition: A | Discharge status: Home / Self Care | Payer: Medicare Other | Source: Ambulatory Visit

## 2013-01-17 DIAGNOSIS — Z1231 Encounter for screening mammogram for malignant neoplasm of breast: Secondary | ICD-10-CM

## 2013-10-19 ENCOUNTER — Other Ambulatory Visit: Payer: Self-pay | Admitting: Internal Medicine

## 2013-10-19 DIAGNOSIS — E2839 Other primary ovarian failure: Secondary | ICD-10-CM

## 2013-10-19 DIAGNOSIS — M810 Age-related osteoporosis without current pathological fracture: Secondary | ICD-10-CM

## 2013-11-03 ENCOUNTER — Ambulatory Visit
Admission: RE | Admit: 2013-11-03 | Discharge: 2013-11-03 | Disposition: A | Discharge status: Home / Self Care | Payer: Medicare HMO | Source: Ambulatory Visit | Attending: Internal Medicine | Admitting: Internal Medicine

## 2013-11-03 DIAGNOSIS — E2839 Other primary ovarian failure: Secondary | ICD-10-CM

## 2013-11-03 DIAGNOSIS — M810 Age-related osteoporosis without current pathological fracture: Secondary | ICD-10-CM

## 2013-12-07 ENCOUNTER — Emergency Department (HOSPITAL_COMMUNITY): Payer: Medicare HMO

## 2013-12-07 ENCOUNTER — Emergency Department (HOSPITAL_COMMUNITY)
Admission: EM | Admit: 2013-12-07 | Discharge: 2013-12-07 | Disposition: A | Discharge status: Home / Self Care | Payer: Medicare HMO | Attending: Emergency Medicine | Admitting: Emergency Medicine

## 2013-12-07 ENCOUNTER — Encounter (HOSPITAL_COMMUNITY): Payer: Self-pay | Admitting: Emergency Medicine

## 2013-12-07 DIAGNOSIS — Z8659 Personal history of other mental and behavioral disorders: Secondary | ICD-10-CM | POA: Insufficient documentation

## 2013-12-07 DIAGNOSIS — Y9289 Other specified places as the place of occurrence of the external cause: Secondary | ICD-10-CM | POA: Insufficient documentation

## 2013-12-07 DIAGNOSIS — G8929 Other chronic pain: Secondary | ICD-10-CM | POA: Insufficient documentation

## 2013-12-07 DIAGNOSIS — Z7982 Long term (current) use of aspirin: Secondary | ICD-10-CM | POA: Insufficient documentation

## 2013-12-07 DIAGNOSIS — Z8679 Personal history of other diseases of the circulatory system: Secondary | ICD-10-CM | POA: Insufficient documentation

## 2013-12-07 DIAGNOSIS — Y9389 Activity, other specified: Secondary | ICD-10-CM | POA: Diagnosis not present

## 2013-12-07 DIAGNOSIS — E039 Hypothyroidism, unspecified: Secondary | ICD-10-CM | POA: Insufficient documentation

## 2013-12-07 DIAGNOSIS — S0990XA Unspecified injury of head, initial encounter: Secondary | ICD-10-CM | POA: Diagnosis present

## 2013-12-07 DIAGNOSIS — I10 Essential (primary) hypertension: Secondary | ICD-10-CM | POA: Insufficient documentation

## 2013-12-07 DIAGNOSIS — W19XXXA Unspecified fall, initial encounter: Secondary | ICD-10-CM

## 2013-12-07 DIAGNOSIS — W01198A Fall on same level from slipping, tripping and stumbling with subsequent striking against other object, initial encounter: Secondary | ICD-10-CM | POA: Insufficient documentation

## 2013-12-07 DIAGNOSIS — Y998 Other external cause status: Secondary | ICD-10-CM | POA: Insufficient documentation

## 2013-12-07 DIAGNOSIS — S01111A Laceration without foreign body of right eyelid and periocular area, initial encounter: Secondary | ICD-10-CM | POA: Diagnosis not present

## 2013-12-07 DIAGNOSIS — IMO0002 Reserved for concepts with insufficient information to code with codable children: Secondary | ICD-10-CM

## 2013-12-07 DIAGNOSIS — Z23 Encounter for immunization: Secondary | ICD-10-CM | POA: Diagnosis not present

## 2013-12-07 DIAGNOSIS — E785 Hyperlipidemia, unspecified: Secondary | ICD-10-CM | POA: Diagnosis not present

## 2013-12-07 DIAGNOSIS — Z8619 Personal history of other infectious and parasitic diseases: Secondary | ICD-10-CM | POA: Diagnosis not present

## 2013-12-07 DIAGNOSIS — S6991XA Unspecified injury of right wrist, hand and finger(s), initial encounter: Secondary | ICD-10-CM | POA: Diagnosis not present

## 2013-12-07 DIAGNOSIS — Z79899 Other long term (current) drug therapy: Secondary | ICD-10-CM | POA: Diagnosis not present

## 2013-12-07 DIAGNOSIS — M1990 Unspecified osteoarthritis, unspecified site: Secondary | ICD-10-CM | POA: Insufficient documentation

## 2013-12-07 MED ORDER — HYDROCODONE-ACETAMINOPHEN 5-325 MG PO TABS
1.0000 | ORAL_TABLET | Freq: Once | ORAL | Status: AC
Start: 2013-12-07 — End: 2013-12-07
  Administered 2013-12-07: 1 via ORAL

## 2013-12-07 MED ORDER — LIDOCAINE HCL (PF) 2 % IJ SOLN
INTRAMUSCULAR | Status: DC
Start: 2013-12-07 — End: 2013-12-07
  Filled 2013-12-07: qty 10

## 2013-12-07 MED ORDER — HYDROCODONE-ACETAMINOPHEN 5-325 MG PO TABS
ORAL_TABLET | ORAL | Status: AC
  Filled 2013-12-07: qty 1

## 2013-12-07 MED ORDER — TETANUS-DIPHTH-ACELL PERTUSSIS 5-2.5-18.5 LF-MCG/0.5 IM SUSP
0.5000 mL | Freq: Once | INTRAMUSCULAR | Status: AC
  Administered 2013-12-07: 0.5 mL via INTRAMUSCULAR
  Filled 2013-12-07: qty 0.5

## 2013-12-07 NOTE — Discharge Instructions (Signed)
Have sutures removed in one week.   Follow up as needed

## 2013-12-07 NOTE — ED Notes (Signed)
Pt c/o R wrist pain. Ice pack placed. Awaiting EDP.

## 2013-12-07 NOTE — ED Provider Notes (Addendum)
CSN: [id number]     Arrival date & time 12/07/13  1738 History  This chart was scribe for Benny LennertJoseph L Jamita Mckelvin, MD by Angelene GiovanniEmmanuella Mensah, ED Scribe. The patient was seen in room [facility]2/[facility] and the patient's care was started at 5:55 PM.    Chief Complaint  Patient presents with  . Head Injury   Patient is a 80 y.o. female presenting with head injury. The history is provided by the patient (Pt status post head injury). No language interpreter was used.  Head Injury Location:  Frontal Mechanism of injury: fall   Chronicity:  New Relieved by:  None tried Worsened by:  Nothing tried Ineffective treatments:  None tried Associated symptoms: no headaches and no seizures   Risk factors: aspirin    HPI Comments: Valerie Dickereggy A Mckinnie is a 80 y.o. female who presents to the Emergency Department complaining of a laceration on her head and right wrist pain. She states that she fell on the sidewalk. She reports that she is currently taking aspirin. She denies taking any blood thinner. She denies pain when moving her knee.   Past Medical History  Diagnosis Date  . Hypertension     takes Enalapril and Chlorthalidone daily  . Hyperlipidemia     takes Simvastatin daily  . History of migraine     last one about 5761yrs ago  . Joint pain   . Arthritis   . Chronic back pain     HNP-takes Tramadol and Pain Pill(at night)  . History of shingles   . Nocturia   . History of blood transfusion     no abnormal reaction noted  . Hypothyroidism     takes Levothyroxine daily  . Anxiety     but doesn't require meds   Past Surgical History  Procedure Laterality Date  . Abdominal hysterectomy    . Back surgery    . Bilateral cataract surgery    . Colonoscopy    . Lumbar laminectomy  02/20/2012  . Lumbar laminectomy  02/20/2012    Procedure: MICRODISCECTOMY LUMBAR LAMINECTOMY;  Surgeon: Eldred MangesMark C Yates, MD;  Location: [facility] OR;  Service: Orthopedics;  Laterality: N/A;  Right L4 hemilaminectomy, microdiscectomy, removal of  free fragment   Family History  Problem Relation Age of Onset  . Hypertension Other    History  Substance Use Topics  . Smoking status: Never Smoker   . Smokeless tobacco: Never Used  . Alcohol Use: No   OB History    Gravida Para Term Preterm AB TAB SAB Ectopic Multiple Living   6 5 5  1  1         Review of Systems  Constitutional: Negative for appetite change and fatigue.  HENT: Negative for congestion, ear discharge and sinus pressure.   Eyes: Negative for discharge.  Respiratory: Negative for cough.   Cardiovascular: Negative for chest pain.  Gastrointestinal: Negative for abdominal pain and diarrhea.  Genitourinary: Negative for frequency and hematuria.  Musculoskeletal: Negative for back pain.  Skin: Negative for rash.  Neurological: Negative for seizures and headaches.  Psychiatric/Behavioral: Negative for hallucinations.      Allergies  Review of patient's allergies indicates no known allergies.  Home Medications   Prior to Admission medications   Medication Sig Start Date End Date Taking? Authorizing Provider  aspirin EC 81 MG tablet Take 81 mg by mouth daily.    Historical Provider, MD  chlorthalidone (HYGROTON) 25 MG tablet Take 25 mg by mouth daily.    Historical Provider, MD  enalapril (VASOTEC) 20 MG tablet Take 20 mg by mouth daily.    Historical Provider, MD  HYDROcodone-acetaminophen (NORCO/VICODIN) 5-325 MG per tablet Take 1-2 tablets by mouth every 4 (four) hours as needed. 02/20/12   Wende NeighborsSheila M Vernon, PA-C  levothyroxine (SYNTHROID, LEVOTHROID) 75 MCG tablet Take 75 mcg by mouth daily.    Historical Provider, MD  simvastatin (ZOCOR) 20 MG tablet Take 20 mg by mouth every evening.    Historical Provider, MD   BP 145/87 mmHg  Pulse 103  Temp(Src) 97.8 F (36.6 C) (Oral)  Resp 20  Ht 5\' 3"  (1.6 m)  Wt 145 lb (65.772 kg)  BMI 25.69 kg/m2  SpO2 100% Physical Exam  Constitutional: She is oriented to person, place, and time. She appears  well-developed.  HENT:  Head: Normocephalic.  Eyes: Conjunctivae and EOM are normal. No scleral icterus.  Neck: Neck supple. No thyromegaly present.  Cardiovascular: Normal rate and regular rhythm.  Exam reveals no gallop and no friction rub.   No murmur heard. Pulmonary/Chest: No stridor. She has no wheezes. She has no rales. She exhibits no tenderness.  Abdominal: She exhibits no distension. There is no tenderness. There is no rebound.  Musculoskeletal: Normal range of motion. She exhibits tenderness. She exhibits no edema.  Mild tenderness to right hand and wrist  Lymphadenopathy:    She has no cervical adenopathy.  Neurological: She is oriented to person, place, and time. She exhibits normal muscle tone. Coordination normal.  Skin: No rash noted. There is erythema.  Large hematoma to the right eyebrow with a 2cm laceration  Psychiatric: She has a normal mood and affect. Her behavior is normal.  Nursing note and vitals reviewed.   ED Course  LACERATION REPAIR Date/Time: 12/07/2013 9:54 PM Performed by: Torrence Hammack L Authorized by: Bethann BerkshireZAMMIT, Jaquin Coy L Comments: 3 cm lac right eyebrow.   Cleaned with betadine.  No lidocaine.   3 staples used to close lac.  Pt tolerated procedure well   (including critical care time) DIAGNOSTIC STUDIES: Oxygen Saturation is 100% on RA, normal by my interpretation.    COORDINATION OF CARE: 5:57 PM- Pt advised of plan for treatment and pt agrees.  Labs Review Labs Reviewed - No data to display  Imaging Review No results found.   EKG Interpretation None      MDM   Final diagnoses:  None      I personally performed the services described in this documentation, which was scribed in my presence. The recorded information has been reviewed and is accurate.    Benny LennertJoseph L Abdelaziz Westenberger, MD 12/07/13 [id number]  Benny LennertJoseph L Isebella Upshur, MD 12/07/13 [phone]

## 2013-12-07 NOTE — ED Notes (Signed)
Pt fell and struck her R eye on a sidewalk. Laceration to R forehead and eyebrow.

## 2013-12-07 NOTE — ED Notes (Signed)
Suture cart to bedside. 

## 2013-12-09 ENCOUNTER — Encounter (HOSPITAL_COMMUNITY): Payer: Self-pay | Admitting: Emergency Medicine

## 2013-12-09 ENCOUNTER — Emergency Department (HOSPITAL_COMMUNITY)
Admission: EM | Admit: 2013-12-09 | Discharge: 2013-12-09 | Disposition: A | Discharge status: Home / Self Care | Payer: Medicare HMO | Attending: Emergency Medicine | Admitting: Emergency Medicine

## 2013-12-09 ENCOUNTER — Emergency Department (HOSPITAL_COMMUNITY): Payer: Medicare HMO

## 2013-12-09 DIAGNOSIS — Z79899 Other long term (current) drug therapy: Secondary | ICD-10-CM | POA: Insufficient documentation

## 2013-12-09 DIAGNOSIS — Z7982 Long term (current) use of aspirin: Secondary | ICD-10-CM | POA: Diagnosis not present

## 2013-12-09 DIAGNOSIS — I10 Essential (primary) hypertension: Secondary | ICD-10-CM | POA: Diagnosis not present

## 2013-12-09 DIAGNOSIS — Z8619 Personal history of other infectious and parasitic diseases: Secondary | ICD-10-CM | POA: Diagnosis not present

## 2013-12-09 DIAGNOSIS — E039 Hypothyroidism, unspecified: Secondary | ICD-10-CM | POA: Insufficient documentation

## 2013-12-09 DIAGNOSIS — R0789 Other chest pain: Secondary | ICD-10-CM | POA: Insufficient documentation

## 2013-12-09 DIAGNOSIS — F419 Anxiety disorder, unspecified: Secondary | ICD-10-CM | POA: Diagnosis not present

## 2013-12-09 DIAGNOSIS — G8929 Other chronic pain: Secondary | ICD-10-CM | POA: Insufficient documentation

## 2013-12-09 DIAGNOSIS — W19XXXA Unspecified fall, initial encounter: Secondary | ICD-10-CM

## 2013-12-09 DIAGNOSIS — R110 Nausea: Secondary | ICD-10-CM | POA: Insufficient documentation

## 2013-12-09 DIAGNOSIS — M1990 Unspecified osteoarthritis, unspecified site: Secondary | ICD-10-CM | POA: Diagnosis not present

## 2013-12-09 DIAGNOSIS — E785 Hyperlipidemia, unspecified: Secondary | ICD-10-CM | POA: Insufficient documentation

## 2013-12-09 DIAGNOSIS — R42 Dizziness and giddiness: Secondary | ICD-10-CM | POA: Diagnosis present

## 2013-12-09 MED ORDER — SODIUM CHLORIDE 0.9 % IV BOLUS (SEPSIS)
1000.0000 mL | Freq: Once | INTRAVENOUS | Status: AC
  Administered 2013-12-09: 1000 mL via INTRAVENOUS

## 2013-12-09 MED ORDER — ONDANSETRON 8 MG PO TBDP: 8.0000 mg | ORAL_TABLET | Freq: Three times a day (TID) | ORAL | Status: DC | PRN

## 2013-12-09 MED ORDER — BACITRACIN-POLYMYXIN B 500-10000 UNIT/GM OP OINT: 1.0000 "application " | TOPICAL_OINTMENT | Freq: Two times a day (BID) | OPHTHALMIC | Status: DC

## 2013-12-09 MED ORDER — ONDANSETRON HCL 4 MG/2ML IJ SOLN
4.0000 mg | Freq: Once | INTRAMUSCULAR | Status: AC
  Administered 2013-12-09: 4 mg via INTRAVENOUS
  Filled 2013-12-09: qty 2

## 2013-12-09 NOTE — ED Notes (Signed)
MD at bedside. 

## 2013-12-09 NOTE — Discharge Instructions (Signed)

## 2013-12-09 NOTE — ED Notes (Signed)
Per Family member in waiting room. Pt was taken back by another RN. Pt not in waiting room when called for triage.

## 2013-12-09 NOTE — ED Notes (Signed)
Pt reports dizziness which has made her unable to walk. Pt states she has a severe headache. Pt seen here 11/11 for fall. Pt unable to sleep last night. C/O nausea but no emesis.

## 2013-12-09 NOTE — ED Provider Notes (Signed)
CSN: [id number]     Arrival date & time 12/09/13  1303 History  This chart was scribed for Lyanne CoKevin M Miqueas Whilden, MD by Bronson CurbJacqueline Melvin, ED Scribe. This patient was seen in room APA05/APA05 and the patient's care was started at 3:17 PM.   Chief Complaint  Patient presents with  . Dizziness    The history is provided by the patient and the spouse. No language interpreter was used.     HPI Comments: Valerie Dickereggy A Davenport is a 80 y.o. female who presents to the Emergency Department complaining of dizziness with onset PTA. Patie patient was seen here 2 days ago after a fall which resulted in facial bruising and right wrist pain. Today, patient reports feeling light-headed with associated HA and nausea. She denies vomiting. Husband reports patient has not had anything to eat or drink today. Patient is not on any anticoagulants.   Past Medical History  Diagnosis Date  . Hypertension     takes Enalapril and Chlorthalidone daily  . Hyperlipidemia     takes Simvastatin daily  . History of migraine     last one about 4259yrs ago  . Joint pain   . Arthritis   . Chronic back pain     HNP-takes Tramadol and Pain Pill(at night)  . History of shingles   . Nocturia   . History of blood transfusion     no abnormal reaction noted  . Hypothyroidism     takes Levothyroxine daily  . Anxiety     but doesn't require meds   Past Surgical History  Procedure Laterality Date  . Abdominal hysterectomy    . Back surgery    . Bilateral cataract surgery    . Colonoscopy    . Lumbar laminectomy  02/20/2012  . Lumbar laminectomy  02/20/2012    Procedure: MICRODISCECTOMY LUMBAR LAMINECTOMY;  Surgeon: Eldred MangesMark C Yates, MD;  [facility];  Service: Orthopedics;  Laterality: N/A;  Right L4 hemilaminectomy, microdiscectomy, removal of free fragment   Family History  Problem Relation Age of Onset  . Hypertension Other    History  Substance Use Topics  . Smoking status: Never Smoker   . Smokeless tobacco: Never Used  .  Alcohol Use: No   OB History    Gravida Para Term Preterm AB TAB SAB Ectopic Multiple Living   6 5 5  1  1         Review of Systems A complete 10 system review of systems was obtained and all systems are negative except as noted in the HPI and PMH.     Allergies  Review of patient's allergies indicates no known allergies.  Home Medications   Prior to Admission medications   Medication Sig Start Date End Date Taking? Authorizing Provider  ALPRAZolam Prudy Feeler(XANAX) 0.25 MG tablet Take 1 tablet by mouth daily as needed for anxiety or sleep.  11/18/13   Historical Provider, MD  aspirin EC 81 MG tablet Take 81 mg by mouth daily.    Historical Provider, MD  Calcium Carbonate-Vitamin D (CALCIUM + D PO) Take 1 tablet by mouth 2 (two) times daily.    Historical Provider, MD  chlorthalidone (HYGROTON) 25 MG tablet Take 25 mg by mouth daily.    Historical Provider, MD  enalapril (VASOTEC) 20 MG tablet Take 20 mg by mouth daily.    Historical Provider, MD  HYDROcodone-acetaminophen (NORCO/VICODIN) 5-325 MG per tablet Take 1-2 tablets by mouth every 4 (four) hours as needed. 02/20/12   Wende NeighborsSheila M Vernon,  PA-C  levothyroxine (SYNTHROID, LEVOTHROID) 50 MCG tablet Take 50 mcg by mouth daily.  10/27/13   Historical Provider, MD  levothyroxine (SYNTHROID, LEVOTHROID) 75 MCG tablet Take 75 mcg by mouth daily.    Historical Provider, MD  simvastatin (ZOCOR) 20 MG tablet Take 20 mg by mouth every evening.    Historical Provider, MD   Triage Vitals: BP 134/81 mmHg  Pulse 80  Temp(Src) 97.6 F (36.4 C) (Oral)  Resp 20  Ht 5\' 7"  (1.702 m)  Wt 145 lb (65.772 kg)  BMI 22.71 kg/m2  SpO2 99%  Physical Exam  Constitutional: She is oriented to person, place, and time. She appears well-developed and well-nourished. No distress.  HENT:  Head: Normocephalic.  Bilateral periorbital ecchymosis; right greater than left. Staples in right eyebrow intact without secondary infection.   Eyes: EOM are normal. Pupils are  equal, round, and reactive to light.  Right subconjunctival hemorrhage. Mild green discharge from right conjunctiva  Neck: Neck supple. No tracheal deviation present.  C-spine nontender.  Cardiovascular: Normal rate and regular rhythm.   Pulmonary/Chest: Effort normal. No respiratory distress. She exhibits tenderness.  Mild right lateral chest wall tenderness.  Abdominal: She exhibits no distension.  Musculoskeletal: Normal range of motion. She exhibits tenderness.  Mild pain with ROM in right wrist. Normal right radial pulse.  Neurological: She is alert and oriented to person, place, and time.  Skin: Skin is warm and dry.  Psychiatric: She has a normal mood and affect. Her behavior is normal.  Nursing note and vitals reviewed.   ED Course  Procedures (including critical care time)  DIAGNOSTIC STUDIES: Oxygen Saturation is 98% on room air, normal by my interpretation.    COORDINATION OF CARE: At 1528 Discussed treatment plan with patient which includes IV fluids, antiemetic, and CXR. Patient agrees.   Labs Review Labs Reviewed - No data to display  Imaging Review Dg Wrist Complete Right  12/07/2013   CLINICAL DATA:  Generalized wrist and hand pain. Fall, landing on right hand. Initial encounter.  EXAM: RIGHT WRIST - COMPLETE 3+ VIEW  COMPARISON:  None.  FINDINGS: Degenerative changes in the right wrist and at the first carpometacarpal joint. No acute bony abnormality. No fracture, subluxation or dislocation. Chondrocalcinosis noted in the triangular fibrocartilage.  IMPRESSION: Degenerative changes within the right wrist. No acute bony abnormality.   Electronically Signed   By: Charlett Nose M.D.   On: 12/07/2013 18:36   Ct Head Wo Contrast  12/07/2013   CLINICAL DATA:  Fall with right head trauma.  EXAM: CT HEAD WITHOUT CONTRAST  CT MAXILLOFACIAL WITHOUT CONTRAST  CT CERVICAL SPINE WITHOUT CONTRAST  TECHNIQUE: Multidetector CT imaging of the head, cervical spine, and maxillofacial  structures were performed using the standard protocol without intravenous contrast. Multiplanar CT image reconstructions of the cervical spine and maxillofacial structures were also generated.  COMPARISON:  None currently available  FINDINGS: CT HEAD FINDINGS  Skull and Sinuses:Large hematoma above the right eye. No calvarial fracture.  Orbits: No traumatic findings.  Bilateral cataract resection.  Brain: No evidence of acute abnormality, such as acute infarction, hemorrhage, hydrocephalus, or mass lesion/mass effect. There is patchy low-attenuation the bilateral deep cerebral white matter, likely chronic small vessel disease. Generalized cerebral volume loss, typical for age.  CT MAXILLOFACIAL FINDINGS  There is a large hematoma above the right eye. No evidence of globe injury or postseptal hematoma. No periorbital fracture. There is minimal irregularity of the left nasal arch. This is likely chronic given lack  of overlying soft tissue swelling and acute appearing fracture line. Edentulous. There is chondrocalcinosis at the temporomandibular joints. No sinus effusion.  CT CERVICAL SPINE FINDINGS  No evidence of acute fracture or traumatic malalignment. No gross cervical canal hematoma or prevertebral edema.  There is extensive disc and ligamentous ossification. Associated erosive changes to the anterior dens favors calcium pyrophosphate deposition disease. There is also diffuse facet osteoarthritis and degenerative disc disease, with foraminal narrowing most notable on the left at C4-5 and C5-6 secondary to uncovertebral disease. Facet ankylosis has occurred at C2-3 on the right.  IMPRESSION: 1. No acute intracranial or cervical spine findings. 2. Large right periorbital hematoma without postseptal hemorrhage or fracture. 3. Chronic findings are noted above.   Electronically Signed   By: Tiburcio PeaJonathan  Watts M.D.   On: 12/07/2013 18:33   Ct Cervical Spine Wo Contrast  12/07/2013   CLINICAL DATA:  Fall with right  head trauma.  EXAM: CT HEAD WITHOUT CONTRAST  CT MAXILLOFACIAL WITHOUT CONTRAST  CT CERVICAL SPINE WITHOUT CONTRAST  TECHNIQUE: Multidetector CT imaging of the head, cervical spine, and maxillofacial structures were performed using the standard protocol without intravenous contrast. Multiplanar CT image reconstructions of the cervical spine and maxillofacial structures were also generated.  COMPARISON:  None currently available  FINDINGS: CT HEAD FINDINGS  Skull and Sinuses:Large hematoma above the right eye. No calvarial fracture.  Orbits: No traumatic findings.  Bilateral cataract resection.  Brain: No evidence of acute abnormality, such as acute infarction, hemorrhage, hydrocephalus, or mass lesion/mass effect. There is patchy low-attenuation the bilateral deep cerebral white matter, likely chronic small vessel disease. Generalized cerebral volume loss, typical for age.  CT MAXILLOFACIAL FINDINGS  There is a large hematoma above the right eye. No evidence of globe injury or postseptal hematoma. No periorbital fracture. There is minimal irregularity of the left nasal arch. This is likely chronic given lack of overlying soft tissue swelling and acute appearing fracture line. Edentulous. There is chondrocalcinosis at the temporomandibular joints. No sinus effusion.  CT CERVICAL SPINE FINDINGS  No evidence of acute fracture or traumatic malalignment. No gross cervical canal hematoma or prevertebral edema.  There is extensive disc and ligamentous ossification. Associated erosive changes to the anterior dens favors calcium pyrophosphate deposition disease. There is also diffuse facet osteoarthritis and degenerative disc disease, with foraminal narrowing most notable on the left at C4-5 and C5-6 secondary to uncovertebral disease. Facet ankylosis has occurred at C2-3 on the right.  IMPRESSION: 1. No acute intracranial or cervical spine findings. 2. Large right periorbital hematoma without postseptal hemorrhage or  fracture. 3. Chronic findings are noted above.   Electronically Signed   By: Tiburcio PeaJonathan  Watts M.D.   On: 12/07/2013 18:33   Dg Hand Complete Right  12/07/2013   CLINICAL DATA:  Generalized right wrist and hand pain. Fall today, landing on outstretched hand.  EXAM: RIGHT HAND - COMPLETE 3+ VIEW  COMPARISON:  Right wrist performed today.  FINDINGS: Chondrocalcinosis in the triangular fibrocartilage. Degenerative changes in the wrist, most pronounced at the first carpometacarpal joint. Degenerative changes in the IP joints. No acute bony abnormality. Specifically, no fracture, subluxation, or dislocation. Soft tissues are intact.  IMPRESSION: No acute bony abnormality.   Electronically Signed   By: Charlett NoseKevin  Dover M.D.   On: 12/07/2013 18:50   Ct Maxillofacial Wo Cm  12/07/2013   CLINICAL DATA:  Fall with right head trauma.  EXAM: CT HEAD WITHOUT CONTRAST  CT MAXILLOFACIAL WITHOUT CONTRAST  CT CERVICAL SPINE WITHOUT  CONTRAST  TECHNIQUE: Multidetector CT imaging of the head, cervical spine, and maxillofacial structures were performed using the standard protocol without intravenous contrast. Multiplanar CT image reconstructions of the cervical spine and maxillofacial structures were also generated.  COMPARISON:  None currently available  FINDINGS: CT HEAD FINDINGS  Skull and Sinuses:Large hematoma above the right eye. No calvarial fracture.  Orbits: No traumatic findings.  Bilateral cataract resection.  Brain: No evidence of acute abnormality, such as acute infarction, hemorrhage, hydrocephalus, or mass lesion/mass effect. There is patchy low-attenuation the bilateral deep cerebral white matter, likely chronic small vessel disease. Generalized cerebral volume loss, typical for age.  CT MAXILLOFACIAL FINDINGS  There is a large hematoma above the right eye. No evidence of globe injury or postseptal hematoma. No periorbital fracture. There is minimal irregularity of the left nasal arch. This is likely chronic given lack  of overlying soft tissue swelling and acute appearing fracture line. Edentulous. There is chondrocalcinosis at the temporomandibular joints. No sinus effusion.  CT CERVICAL SPINE FINDINGS  No evidence of acute fracture or traumatic malalignment. No gross cervical canal hematoma or prevertebral edema.  There is extensive disc and ligamentous ossification. Associated erosive changes to the anterior dens favors calcium pyrophosphate deposition disease. There is also diffuse facet osteoarthritis and degenerative disc disease, with foraminal narrowing most notable on the left at C4-5 and C5-6 secondary to uncovertebral disease. Facet ankylosis has occurred at C2-3 on the right.  IMPRESSION: 1. No acute intracranial or cervical spine findings. 2. Large right periorbital hematoma without postseptal hemorrhage or fracture. 3. Chronic findings are noted above.   Electronically Signed   By: Tiburcio Pea M.D.   On: 12/07/2013 18:33  I personally reviewed the imaging tests through PACS system I reviewed available ER/hospitalization records through the EMR    EKG Interpretation None      MDM   Final diagnoses:  None    Patient with ongoing pain of her right wrist likely secondary to arthritis.  She is in a cockup wrist splint.  She will continue this.  No indication for repeat imaging.  Mild right lateral chest tenderness likely chest wall contusion.  No ecchymosis.  X-ray negative for rib fracture pneumothorax or pulmonary contusion.  Vital signs normal.  Questionable green discharge coming from her right eye likely representing mild conjunctivitis.  Patient be placed on short course of ophthalmic antibiotics.  Overall vision in that right eye is normal.  Mild nausea.  Decreased oral intake today.  No vomiting.  Nonfocal neuro exam.  Head CT reviewed from yesterday and without bleed.  Not on anticoagulants except for an aspirin.  No indication for repeat CT imaging of her head today.  She feels much better  after IV fluids.family understands to return the patient to the ER for new or worsening symptoms.  Patient ambulatory in the ER.  I personally performed the services described in this documentation, which was scribed in my presence. The recorded information has been reviewed and is accurate.      Lyanne Co, MD 12/09/13 [phone]

## 2013-12-09 NOTE — ED Notes (Signed)
PT c/o continuously right wrist pain from fall on 12/09/13 and some dizziness at times since her fall. PT alert and oriented with bilateral eye bruseing and swelling noted. Right wrist swollen and painful with rom.

## 2014-04-07 ENCOUNTER — Other Ambulatory Visit: Payer: Self-pay

## 2014-04-07 DIAGNOSIS — Z1231 Encounter for screening mammogram for malignant neoplasm of breast: Secondary | ICD-10-CM

## 2014-11-23 ENCOUNTER — Other Ambulatory Visit: Payer: Self-pay

## 2014-11-23 DIAGNOSIS — Z1231 Encounter for screening mammogram for malignant neoplasm of breast: Secondary | ICD-10-CM

## 2014-12-13 ENCOUNTER — Ambulatory Visit: Payer: Medicare HMO

## 2015-08-16 ENCOUNTER — Ambulatory Visit (HOSPITAL_COMMUNITY): Payer: Medicare HMO | Attending: Orthopaedic Surgery | Admitting: Physical Therapy

## 2015-08-16 DIAGNOSIS — M5412 Radiculopathy, cervical region: Secondary | ICD-10-CM | POA: Insufficient documentation

## 2015-08-16 DIAGNOSIS — R293 Abnormal posture: Secondary | ICD-10-CM | POA: Diagnosis present

## 2015-08-16 DIAGNOSIS — M62838 Other muscle spasm: Secondary | ICD-10-CM | POA: Diagnosis present

## 2015-08-16 NOTE — Patient Instructions (Signed)
Scapular Retraction (Standing)    With arms at sides, pinch shoulder blades together. Repeat _10___ times per set. Do __1__ sets per session. Do __4__ sessions per day.  http://orth.exer.us/944   Copyright  VHI. All rights reserved.
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
## 2015-08-21 ENCOUNTER — Ambulatory Visit (HOSPITAL_COMMUNITY): Payer: Medicare HMO | Admitting: Physical Therapy

## 2015-08-21 DIAGNOSIS — M62838 Other muscle spasm: Secondary | ICD-10-CM

## 2015-08-21 DIAGNOSIS — R293 Abnormal posture: Secondary | ICD-10-CM

## 2015-08-21 DIAGNOSIS — M5412 Radiculopathy, cervical region: Secondary | ICD-10-CM

## 2015-08-21 NOTE — Therapy (Signed)
[facility] [address] [place], [place], [address] Phone: [phone]   Fax:  [phone]  Physical Therapy Treatment  Patient Details  Name: Valerie Davenport MRN: [id number] Date of Birth: 1931/10/17 Referring Provider: Annell Greening  Encounter Date: 08/21/2015      PT End of Session - 08/21/15 1521    Visit Number 2   Number of Visits 8   Date for PT Re-Evaluation 09/15/15   Authorization Type [employer]   Authorization - Visit Number 2   Authorization - Number of Visits 8   PT Start Time 1346   PT Stop Time 1429   PT Time Calculation (min) 43 min   Activity Tolerance Patient tolerated treatment well   Behavior During Therapy [facility] for tasks assessed/performed      Past Medical History:  Diagnosis Date  . Anxiety    but doesn't require meds  . Arthritis   . Chronic back pain    HNP-takes Tramadol and Pain Pill(at night)  . History of blood transfusion    no abnormal reaction noted  . History of migraine    last one about 52yrs ago  . History of shingles   . Hyperlipidemia    takes Simvastatin daily  . Hypertension    takes Enalapril and Chlorthalidone daily  . Hypothyroidism    takes Levothyroxine daily  . Joint pain   . Nocturia     Past Surgical History:  Procedure Laterality Date  . ABDOMINAL HYSTERECTOMY    . BACK SURGERY    . bilateral cataract surgery    . COLONOSCOPY    . LUMBAR LAMINECTOMY  02/20/2012  . LUMBAR LAMINECTOMY  02/20/2012   Procedure: MICRODISCECTOMY LUMBAR LAMINECTOMY;  Surgeon: Eldred Manges, MD;  Location: [facility] OR;  Service: Orthopedics;  Laterality: N/A;  Right L4 hemilaminectomy, microdiscectomy, removal of free fragment    There were no vitals filed for this visit.      Subjective Assessment - 08/21/15 1347    Subjective Pt reports she is doing good. She has been doing her exercises, and thinks things are going a little better.   Pertinent History back surgery, HTN,    Patient  Stated Goals less pain    Currently in Pain? Yes   Pain Location Neck   Pain Orientation Right   Pain Descriptors / Indicators Aching   Pain Type Chronic pain   Pain Onset More than a month ago   Pain Frequency Intermittent   Aggravating Factors  cervical rotation to the Rt and cervical flexion            [facility] PT Assessment - 08/21/15 0001      Special Tests    Special Tests Cervical   Cervical Tests other     other    Findings Positive   Comment B (+) ULTT 1 at ~30 deg elbow ext                      [facility] Adult PT Treatment/Exercise - 08/21/15 0001      Neck Exercises: Seated   Neck Retraction 5 reps   Neck Retraction Limitations unable to sequence    Other Seated Exercise scapular retraction x15 reps      Neck Exercises: Supine   Neck Retraction 10 reps   Neck Retraction Limitations max verbal/tactile cues for correct technique     Manual Therapy   Manual Therapy Myofascial release;Passive ROM   Manual therapy comments  separate from all other interventions    Myofascial Release Sub occipital release; STM/TrP release Rt upper trap/levator/cervical paraspinals   Passive ROM cervical rotation/lateral flexion x5 reps each      Neck Exercises: Stretches   Upper Trapezius Stretch 3 reps;20 seconds  increased verbal cues for technique   Chest Stretch 2 reps;60 seconds  supine over towel roll                PT Education - 08/21/15 1521    Education provided Yes   Education Details technique with therex; discussed HEP   Person(s) Educated Patient   Methods Explanation;Demonstration   Comprehension Verbalized understanding;Verbal cues required;Tactile cues required;Need further instruction          PT Short Term Goals - 08/16/15 1443      PT SHORT TERM GOAL #1   Title Pt to be independent in HEP to allow pain to be no greater than a 4/10 to allow pt to vacuum without increased pain    Time 2   Period Weeks   Status New     PT SHORT  TERM GOAL #2   Title Pt grip strength in B UE to be increased by 10# to allow pt open bottles with increased ease.    Time 2   Period Weeks   Status New     PT SHORT TERM GOAL #3   Title Pt to have at least 55 degrees of cervical rotation bilaterally to allow pt to drive safer by being able to see in her blind spot.    Time 2   Period Weeks   Status New           PT Long Term Goals - 08/16/15 1447      PT LONG TERM GOAL #1   Title Pt to be able to pick up her pots and pans when cooking instead of relying on her husband   Time 4   Period Weeks   Status New     PT LONG TERM GOAL #2   Title Pt to be able to carry her groceries into the house withot difficulty   Time 4   Period Weeks   Status New     PT LONG TERM GOAL #3   Title Pt to neck and hand pain to be no greater than a 2/10 to allow her to be free of having to take pain medication due to her neck or her hand    Time 4   Period Weeks   Status New               Plan - 08/21/15 1522    Clinical Impression Statement Today's session focused on therex and manual techniques to improve cervical ROM and strength. Pt with limited cervical AROM>PROM and tenderness to palpation along her sub occipitals, Rt cervical paraspinals and levator scap. Instructed in stretches and cervical retraction for her to perform at home, however she needs extensive cues for correct technique. This also made it difficult to perform passive ROM and stretches throughout the manual treatment portion of the session. I reviewed HEP with pt and adjusted technique as needed. Pt reporting some improvement in stiffness by the end of today's session.   Rehab Potential Good   PT Frequency 2x / week   PT Duration 4 weeks   PT Treatment/Interventions ADLs/Self Care Home Management;Electrical Stimulation;Moist Heat;Traction;Ultrasound;Therapeutic activities;Therapeutic exercise;Manual techniques;Passive range of motion   PT Next Visit Plan cervical  retraction in supine; thoracic mobility,  postural stretches/strengthening; manual techniques to decrease spasm; give pt putty to increase both Rt and Lt hand grip.    PT Home Exercise Plan no updates this visit    Consulted and Agree with Plan of Care Patient      Patient will benefit from skilled therapeutic intervention in order to improve the following deficits and impairments:  Decreased range of motion, Decreased strength, Increased muscle spasms, Postural dysfunction, Pain  Visit Diagnosis: Radiculopathy, cervical region  Abnormal posture  Other muscle spasm     Problem List Patient Active Problem List   Diagnosis Date Noted  . HNP (herniated nucleus pulposus), lumbar 02/20/2012    Class: Diagnosis of   3:38 PM,08/21/15 Marylyn Ishihara PT, DPT [facility] [phone]  [facility] [address] [place], [place], [address] Phone: [phone]   Fax:  [phone]  Name: Valerie Davenport MRN: [id number] Date of Birth: [birth year]

## 2015-08-28 ENCOUNTER — Ambulatory Visit (HOSPITAL_COMMUNITY): Payer: Medicare HMO | Attending: Orthopaedic Surgery

## 2015-08-28 DIAGNOSIS — R293 Abnormal posture: Secondary | ICD-10-CM | POA: Insufficient documentation

## 2015-08-28 DIAGNOSIS — M5412 Radiculopathy, cervical region: Secondary | ICD-10-CM | POA: Insufficient documentation

## 2015-08-28 DIAGNOSIS — M62838 Other muscle spasm: Secondary | ICD-10-CM | POA: Insufficient documentation

## 2015-08-28 NOTE — Patient Instructions (Signed)
Home Exercises Program Theraputty Exercises  Do the following exercises 2 times a day using your affected hand.  1. Roll putty into a ball.  2. Make into a pancake.  3. Roll putty into a roll.  4. Pinch along log with first finger and thumb.   5. Make into a ball.  6. Roll it back into a log.   7. Pinch using thumb and side of first finger.  8. Roll into a ball, then flatten into a pancake.  9. Using your fingers, make putty into a mountain.   

## 2015-08-28 NOTE — Therapy (Signed)
[facility] [address] [place], [place], [address] Phone: [phone]   Fax:  [phone]  Physical Therapy Treatment  Patient Details  Name: Valerie Davenport MRN: [id number] Date of Birth: 1931/08/16 Referring Provider: Annell Greening  Encounter Date: 08/28/2015      PT End of Session - 08/28/15 1448    Visit Number 3   Number of Visits 8   Date for PT Re-Evaluation 09/15/15   Authorization Type [employer]   Authorization - Visit Number 3   Authorization - Number of Visits 8   PT Start Time 1435   PT Stop Time 1517   PT Time Calculation (min) 42 min   Activity Tolerance Patient tolerated treatment well   Behavior During Therapy [facility] for tasks assessed/performed      Past Medical History:  Diagnosis Date  . Anxiety    but doesn't require meds  . Arthritis   . Chronic back pain    HNP-takes Tramadol and Pain Pill(at night)  . History of blood transfusion    no abnormal reaction noted  . History of migraine    last one about 67yrs ago  . History of shingles   . Hyperlipidemia    takes Simvastatin daily  . Hypertension    takes Enalapril and Chlorthalidone daily  . Hypothyroidism    takes Levothyroxine daily  . Joint pain   . Nocturia     Past Surgical History:  Procedure Laterality Date  . ABDOMINAL HYSTERECTOMY    . BACK SURGERY    . bilateral cataract surgery    . COLONOSCOPY    . LUMBAR LAMINECTOMY  02/20/2012  . LUMBAR LAMINECTOMY  02/20/2012   Procedure: MICRODISCECTOMY LUMBAR LAMINECTOMY;  Surgeon: Eldred Manges, MD;  Location: [facility] OR;  Service: Orthopedics;  Laterality: N/A;  Right L4 hemilaminectomy, microdiscectomy, removal of free fragment    There were no vitals filed for this visit.      Subjective Assessment - 08/28/15 1440    Subjective Pt stated she is feeling good today, no reports of neck pain today.  Reports compliance with HEP daily.  Pt c/o kidney/ baladder pain constant burning pain began  around 3 days ago   Pertinent History back surgery, HTN,    Patient Stated Goals less pain    Currently in Pain? No/denies   Pain Score --  0/10 cervical pain, Unable to rate kidney pain   Pain Location Bladder   Pain Orientation Right;Left   Pain Descriptors / Indicators Burning  "Hurts really bad"   Pain Type Chronic pain   Pain Onset More than a month ago   Pain Frequency Constant                         [facility] Adult PT Treatment/Exercise - 08/28/15 0001      Neck Exercises: Seated   Neck Retraction Limitations unable to sequence    Cervical Rotation 5 reps   Cervical Rotation Limitations 3D cervical excursion cueing for posture   Lateral Flexion 5 reps   Lateral Flexion Limitations 3D cervical excursion cueing for posture   W Back Limitations 10x with tactile and verbal cueing   Other Seated Exercise scapular retraction x15 reps    Other Seated Exercise hand putty exercises; given yellow putty and HEP printout     Neck Exercises: Supine   Neck Retraction 10 reps   Neck Retraction Limitations max verbal/tactile cues for correct technique  Manual Therapy   Manual Therapy Myofascial release;Passive ROM   Manual therapy comments separate from all other interventions    Myofascial Release Sub occipital release; STM/TrP release Rt upper trap/levator/cervical paraspinals   Passive ROM cervical rotation/lateral flexion x5 reps each                   PT Short Term Goals - 08/16/15 1443      PT SHORT TERM GOAL #1   Title Pt to be independent in HEP to allow pain to be no greater than a 4/10 to allow pt to vacuum without increased pain    Time 2   Period Weeks   Status New     PT SHORT TERM GOAL #2   Title Pt grip strength in B UE to be increased by 10# to allow pt open bottles with increased ease.    Time 2   Period Weeks   Status New     PT SHORT TERM GOAL #3   Title Pt to have at least 55 degrees of cervical rotation bilaterally to allow  pt to drive safer by being able to see in her blind spot.    Time 2   Period Weeks   Status New           PT Long Term Goals - 08/16/15 1447      PT LONG TERM GOAL #1   Title Pt to be able to pick up her pots and pans when cooking instead of relying on her husband   Time 4   Period Weeks   Status New     PT LONG TERM GOAL #2   Title Pt to be able to carry her groceries into the house withot difficulty   Time 4   Period Weeks   Status New     PT LONG TERM GOAL #3   Title Pt to neck and hand pain to be no greater than a 2/10 to allow her to be free of having to take pain medication due to her neck or her hand    Time 4   Period Weeks   Status New               Plan - 08/28/15 1618    Clinical Impression Statement Session focus on improving cervical ROM, posture education, awareness and strengthening of postural musculature and began hand grip strengthening exercises.  Pt required max verbal, tacile and demonstration cueing to improve form with cervical movements to improve posture and reduce compensation.  Manual techniques complete to reduce tightness cervical region and improve ROM.  Pt instructed technqiues to assist with grip strengthening Bil hands, pt given printout and yellow putty to add to HEP.  No reports of pain through session.     Rehab Potential Good   PT Frequency 2x / week   PT Duration 4 weeks   PT Treatment/Interventions ADLs/Self Care Home Management;Electrical Stimulation;Moist Heat;Traction;Ultrasound;Therapeutic activities;Therapeutic exercise;Manual techniques;Passive range of motion   PT Next Visit Plan cervical retraction in supine; thoracic mobility, postural stretches/strengthening; manual techniques to decrease spasm; give pt putty to increase both Rt and Lt hand grip.    PT Home Exercise Plan Given yellow putty and printout for grip strengthening.        Patient will benefit from skilled therapeutic intervention in order to improve the  following deficits and impairments:  Decreased range of motion, Decreased strength, Increased muscle spasms, Postural dysfunction, Pain  Visit Diagnosis: Radiculopathy, cervical region  Abnormal  posture  Other muscle spasm     Problem List Patient Active Problem List   Diagnosis Date Noted  . HNP (herniated nucleus pulposus), lumbar 02/20/2012    Class: Diagnosis of   Becky Sax, LPTA; CBIS [id number]  Juel Burrow 08/28/2015, 4:24 PM  [facility] [address] [place], [place], [address] Phone: [phone]   Fax:  [phone]  Name: STEPHANA HADAD MRN: [id number] Date of Birth: Feb 23, 1931

## 2015-08-30 ENCOUNTER — Encounter (HOSPITAL_COMMUNITY): Payer: Medicare HMO | Admitting: Physical Therapy

## 2015-08-30 ENCOUNTER — Ambulatory Visit (HOSPITAL_COMMUNITY): Payer: Medicare HMO

## 2015-08-30 DIAGNOSIS — M5412 Radiculopathy, cervical region: Secondary | ICD-10-CM | POA: Diagnosis not present

## 2015-08-30 DIAGNOSIS — R293 Abnormal posture: Secondary | ICD-10-CM

## 2015-08-30 DIAGNOSIS — M62838 Other muscle spasm: Secondary | ICD-10-CM

## 2015-08-30 NOTE — Therapy (Addendum)
[place], [place], [address] Phone: [phone]   Fax:  [phone]  Physical Therapy Treatment  Patient Details  Name: Valerie Davenport MRN: [id number] Date of Birth: 09-Aug-1931 Referring Provider: Rodell Perna  Encounter Date: 08/30/2015      PT End of Session - 08/30/15 0853    Visit Number 4   Number of Visits 8   Date for PT Re-Evaluation 09/15/15   Authorization Type [employer]   Authorization - Visit Number 4   Authorization - Number of Visits 8   PT Start Time [phone]   PT Stop Time 0854   PT Time Calculation (min) 38 min   Activity Tolerance Patient tolerated treatment well   Behavior During Therapy [facility] for tasks assessed/performed      Past Medical History:  Diagnosis Date  . Anxiety    but doesn't require meds  . Arthritis   . Chronic back pain    HNP-takes Tramadol and Pain Pill(at night)  . History of blood transfusion    no abnormal reaction noted  . History of migraine    last one about 61yr ago  . History of shingles   . Hyperlipidemia    takes Simvastatin daily  . Hypertension    takes Enalapril and Chlorthalidone daily  . Hypothyroidism    takes Levothyroxine daily  . Joint pain   . Nocturia     Past Surgical History:  Procedure Laterality Date  . ABDOMINAL HYSTERECTOMY    . BACK SURGERY    . bilateral cataract surgery    . COLONOSCOPY    . LUMBAR LAMINECTOMY  02/20/2012  . LUMBAR LAMINECTOMY  02/20/2012   Procedure: MICRODISCECTOMY LUMBAR LAMINECTOMY;  Surgeon: MMarybelle Killings MD;  [facility]  Service: Orthopedics;  Laterality: N/A;  Right L4 hemilaminectomy, microdiscectomy, removal of free fragment    There were no vitals filed for this visit.      Subjective Assessment - 08/30/15 0815    Subjective Pt stated she went to doctor with reports of medication for bladder pain.  Pt stated she is pain free currently, feels like she slept wrong last night.  Reports  compliance with putty and HEP daily.   Pertinent History back surgery, HTN,    Patient Stated Goals less pain    Currently in Pain? No/denies            [facility] - [facility] 0001      Neck Exercises: Theraband   Shoulder Extension 10 reps;Red   Rows 15 reps;Red     Neck Exercises: Seated   Cervical Rotation 10 reps   Cervical Rotation Limitations 3D cervical excursion cueing for posture   Lateral Flexion 10 reps   Lateral Flexion Limitations 3D cervical excursion cueing for posture   X to V 10 reps   X to V Weights (lbs) demonstration and verbal cueing   W Back Limitations 10x with demonstration, tactile and verbal cueing   Other Seated Exercise scapular retraction x15 reps      Neck Exercises: Supine   Neck Retraction 10 reps   Neck Retraction Limitations max verbal/tactile cues for correct technique     Manual Therapy   Manual Therapy Myofascial release;Passive ROM   Manual therapy comments separate from all other interventions    Myofascial Release Sub occipital release; STM/TrP release Rt upper trap/levator/cervical paraspinals   Passive ROM cervical rotation/lateral flexion x5 reps each  PT Short Term Goals - 08/16/15 1443      PT SHORT TERM GOAL #1   Title Pt to be independent in HEP to allow pain to be no greater than a 4/10 to allow pt to vacuum without increased pain    Time 2   Period Weeks   Status met     PT SHORT TERM GOAL #2   Title Pt grip strength in B UE to be increased by 10# to allow pt open bottles with increased ease.    Time 2   Period Weeks   Status Ongoing      PT SHORT TERM GOAL #3   Title Pt to have at least 55 degrees of cervical rotation bilaterally to allow pt to drive safer by being able to see in her blind spot.    Time 2   Period Weeks   Status ongoing           PT Long Term Goals - 08/16/15 1447      PT LONG TERM GOAL #1   Title Pt to be able to pick up her pots and  pans when cooking instead of relying on her husband   Time 4   Period Weeks   Status ongoing     PT LONG TERM GOAL #2   Title Pt to be able to carry her groceries into the house withot difficulty   Time 4   Period Weeks   Status ongoing     PT LONG TERM GOAL #3   Title Pt to neck and hand pain to be no greater than a 2/10 to allow her to be free of having to take pain medication due to her neck or her hand    Time 4   Period Weeks   Status Ongoing                Plan - 08/30/15 0853    Clinical Impression Statement Session focus on improving cervical ROM and progressed postural strengthening.  Therapist facilitation through session to improve form with all exercises and cueing to reduce UT activation during cervical movements.  Ended session with manual technqiues to reduce tightness cervical region and improve ROM.  No reports of pain through session.   Rehab Potential Good   PT Frequency 2x / week   PT Duration 4 weeks   PT Treatment/Interventions ADLs/Self Care Home Management;Electrical Stimulation;Moist Heat;Traction;Ultrasound;Therapeutic activities;Therapeutic exercise;Manual techniques;Passive range of motion   PT Next Visit Plan cervical retraction in supine; thoracic mobility, postural stretches/strengthening; manual techniques to decrease spasm; give pt putty to increase both Rt and Lt hand grip.       Patient will benefit from skilled therapeutic intervention in order to improve the following deficits and impairments:  Decreased range of motion, Decreased strength, Increased muscle spasms, Postural dysfunction, Pain  Visit Diagnosis: Radiculopathy, cervical region  Abnormal posture  Other muscle spasm     Problem List Patient Active Problem List   Diagnosis Date Noted  . HNP (herniated nucleus pulposus), lumbar 02/20/2012    Class: Diagnosis of   Ihor Austin, LPTA; [place]  Rayetta Humphrey, PT CLT [phone] 08/30/2015, 8:58  AM  [place], [place], [address] Phone: [phone]   Fax:  [phone]  Name: Valerie Davenport MRN: [id number] Date of Birth: Jun 08, 1931  PHYSICAL THERAPY DISCHARGE SUMMARY  Visits from Start of Care: 4  Current functional level related to goals / functional outcomes: PT still  has some pain and decreased ROM but has an acute bladder infection and requests to be Discharge to HEP.   Remaining deficits: See above    Education / Equipment: HEP Plan: Patient agrees to discharge.  Patient goals were partially met. Patient is being discharged due to a change in medical status.  ?????       Rayetta Humphrey, [place] CLT [phone]

## 2015-08-31 ENCOUNTER — Telehealth (HOSPITAL_COMMUNITY): Payer: Self-pay | Admitting: Physical Therapy

## 2015-08-31 NOTE — Telephone Encounter (Signed)
MD ordered MRI and she has a bladder infection she requested to be discharged and will get another referral when she gets better.

## 2015-09-04 ENCOUNTER — Encounter (HOSPITAL_COMMUNITY): Payer: Medicare HMO

## 2015-09-06 ENCOUNTER — Encounter (HOSPITAL_COMMUNITY): Payer: Medicare HMO | Admitting: Physical Therapy

## 2015-09-10 ENCOUNTER — Encounter (HOSPITAL_COMMUNITY): Payer: Medicare HMO

## 2015-09-13 ENCOUNTER — Encounter (HOSPITAL_COMMUNITY): Payer: Medicare HMO | Admitting: Physical Therapy

## 2016-10-26 ENCOUNTER — Emergency Department (HOSPITAL_COMMUNITY): Payer: Medicare HMO

## 2016-10-26 ENCOUNTER — Emergency Department (HOSPITAL_COMMUNITY)
Admission: EM | Admit: 2016-10-26 | Discharge: 2016-10-26 | Disposition: A | Discharge status: Home / Self Care | Payer: Medicare HMO | Attending: Emergency Medicine | Admitting: Emergency Medicine

## 2016-10-26 ENCOUNTER — Encounter (HOSPITAL_COMMUNITY): Payer: Self-pay | Admitting: *Deleted

## 2016-10-26 DIAGNOSIS — E039 Hypothyroidism, unspecified: Secondary | ICD-10-CM | POA: Diagnosis not present

## 2016-10-26 DIAGNOSIS — I10 Essential (primary) hypertension: Secondary | ICD-10-CM | POA: Diagnosis not present

## 2016-10-26 DIAGNOSIS — Z7982 Long term (current) use of aspirin: Secondary | ICD-10-CM | POA: Insufficient documentation

## 2016-10-26 DIAGNOSIS — R51 Headache: Secondary | ICD-10-CM | POA: Diagnosis present

## 2016-10-26 DIAGNOSIS — Z79899 Other long term (current) drug therapy: Secondary | ICD-10-CM | POA: Diagnosis not present

## 2016-10-26 LAB — CBC WITH DIFFERENTIAL/PLATELET
Basophils Absolute: 0 10*3/uL (ref 0.0–0.1)
Basophils Relative: 0 %
Eosinophils Absolute: 0.2 10*3/uL (ref 0.0–0.7)
Eosinophils Relative: 2 %
HEMATOCRIT: 43.4 % (ref 36.0–46.0)
HEMOGLOBIN: 14.1 g/dL (ref 12.0–15.0)
LYMPHS PCT: 24 %
Lymphs Abs: 2 10*3/uL (ref 0.7–4.0)
MCH: 29.8 pg (ref 26.0–34.0)
MCHC: 32.5 g/dL (ref 30.0–36.0)
MCV: 91.8 fL (ref 78.0–100.0)
Monocytes Absolute: 0.6 10*3/uL (ref 0.1–1.0)
Monocytes Relative: 7 %
NEUTROS ABS: 5.4 10*3/uL (ref 1.7–7.7)
NEUTROS PCT: 67 %
Platelets: 216 10*3/uL (ref 150–400)
RBC: 4.73 MIL/uL (ref 3.87–5.11)
RDW: 13.4 % (ref 11.5–15.5)
WBC: 8.2 10*3/uL (ref 4.0–10.5)

## 2016-10-26 LAB — BASIC METABOLIC PANEL
Anion gap: 10 (ref 5–15)
BUN: 29 mg/dL — AB (ref 6–20)
CHLORIDE: 108 mmol/L (ref 101–111)
CO2: 24 mmol/L (ref 22–32)
CREATININE: 0.97 mg/dL (ref 0.44–1.00)
Calcium: 9.6 mg/dL (ref 8.9–10.3)
GFR calc non Af Amer: 53 mL/min — ABNORMAL LOW (ref >60)
GLUCOSE: 117 mg/dL — AB (ref 65–99)
Potassium: 3.6 mmol/L (ref 3.5–5.1)
Sodium: 142 mmol/L (ref 135–145)

## 2016-10-26 LAB — URINALYSIS, ROUTINE W REFLEX MICROSCOPIC
Bilirubin Urine: NEGATIVE
Glucose, UA: NEGATIVE mg/dL
Hgb urine dipstick: NEGATIVE
Ketones, ur: NEGATIVE mg/dL
Leukocytes, UA: NEGATIVE
Nitrite: NEGATIVE
PH: 6 (ref 5.0–8.0)
Protein, ur: NEGATIVE mg/dL
SPECIFIC GRAVITY, URINE: 1.009 (ref 1.005–1.030)

## 2016-10-26 LAB — TROPONIN I
Troponin I: <0.03 ng/mL (ref <0.03)
Troponin I: <0.03 ng/mL (ref <0.03)

## 2016-10-26 MED ORDER — CHLORTHALIDONE 25 MG PO TABS
25.0000 mg | ORAL_TABLET | Freq: Every day | ORAL | Status: DC
  Administered 2016-10-26: 25 mg via ORAL
  Filled 2016-10-26 (×3): qty 1

## 2016-10-26 MED ORDER — AMLODIPINE BESYLATE 5 MG PO TABS
5.0000 mg | ORAL_TABLET | Freq: Once | ORAL | Status: AC
  Administered 2016-10-26: 5 mg via ORAL
  Filled 2016-10-26: qty 1

## 2016-10-26 MED ORDER — ENALAPRIL MALEATE 20 MG PO TABS
20.0000 mg | ORAL_TABLET | Freq: Every day | ORAL | Status: DC
  Administered 2016-10-26: 20 mg via ORAL
  Filled 2016-10-26: qty 4
  Filled 2016-10-26 (×2): qty 1

## 2016-10-26 MED ORDER — AMLODIPINE BESYLATE 5 MG PO TABS: 5.0000 mg | ORAL_TABLET | Freq: Every day | ORAL | 0 refills | Status: DC

## 2016-10-26 MED ORDER — CLONIDINE HCL 0.1 MG PO TABS
0.1000 mg | ORAL_TABLET | Freq: Once | ORAL | Status: AC
  Administered 2016-10-26: 0.1 mg via ORAL
  Filled 2016-10-26: qty 1

## 2016-10-26 NOTE — ED Provider Notes (Signed)
Patient remains well. She is asymptomatic and headache has resolved. She was observed in the ED for a little bit longer than originally planned because her blood pressure had dropped to about 118 systolic. However she was asymptomatic from this. Likely from multiple meds being given at once. However now she is slowly creeping back up. She appears stable for discharge to follow-up with her primary care physician.   Pricilla Loveless, MD 10/26/16 [phone]

## 2016-10-26 NOTE — ED Notes (Signed)
EKG given to Dr. Goldston  

## 2016-10-26 NOTE — ED Provider Notes (Signed)
[facility] DEPT Provider Note   CSN: [id number] Arrival date & time: 10/26/16  0334     History   Chief Complaint Chief Complaint  Patient presents with  . Hypertension    HPI Valerie Davenport is a 83 y.o. female.  Level V caveat for hearing impairment. Patient brought in by family with headache that provided her from going to sleep tonight. States she's had intermittent headaches for the past week associated with elevated blood pressure. Patient states she was recently restarted on her blood pressure medications 2 weeks ago after having been discontinued for unclear reasons. BLood pressure tonight was 200/100 per family.  Patient denies any focal weakness, numbness or tingling. No vision changes. No chest pain or shortness of breath. Patient has not followed up with her PCP. She was seen at urgent care on September 9 and was given clonidine but did not follow up with her PCP after this.She is unclear what medications she is taking.chart review shows that she was last prescribed enalapril chlorthalidone. She is a mild headache currently. Denies any neck pain or back pain.   The history is provided by the patient and a relative.  Hypertension  Associated symptoms include headaches. Pertinent negatives include no chest pain, no abdominal pain and no shortness of breath.    Past Medical History:  Diagnosis Date  . Anxiety    but doesn't require meds  . Arthritis   . Chronic back pain    HNP-takes Tramadol and Pain Pill(at night)  . History of blood transfusion    no abnormal reaction noted  . History of migraine    last one about 48yrs ago  . History of shingles   . Hyperlipidemia    takes Simvastatin daily  . Hypertension    takes Enalapril and Chlorthalidone daily  . Hypothyroidism    takes Levothyroxine daily  . Joint pain   . Nocturia     Patient Active Problem List   Diagnosis Date Noted  . HNP (herniated nucleus pulposus), lumbar 02/20/2012    Class: Diagnosis of     Past Surgical History:  Procedure Laterality Date  . ABDOMINAL HYSTERECTOMY    . BACK SURGERY    . bilateral cataract surgery    . COLONOSCOPY    . LUMBAR LAMINECTOMY  02/20/2012  . LUMBAR LAMINECTOMY  02/20/2012   Procedure: MICRODISCECTOMY LUMBAR LAMINECTOMY;  Surgeon: Eldred Manges, MD;  Location: [facility] OR;  Service: Orthopedics;  Laterality: N/A;  Right L4 hemilaminectomy, microdiscectomy, removal of free fragment    OB History    Gravida Para Term Preterm AB Living   SAB TAB Ectopic Multiple Live Births   1               Home Medications    Prior to Admission medications   Medication Sig Start Date End Date Taking? Authorizing Provider  ALPRAZolam Prudy Feeler) 0.25 MG tablet Take 1 tablet by mouth daily as needed for anxiety or sleep. Reported on 08/16/2015 11/18/13   [provider]  aspirin EC 81 MG tablet Take 81 mg by mouth daily.    [provider]  bacitracin-polymyxin b (POLYSPORIN) ophthalmic ointment Place 1 application into the right eye 2 (two) times daily. apply to eye every 12 hours while awake x 5 days Patient not taking: Reported on 08/16/2015 12/09/13   [name], MD  Calcium Carbonate-Vitamin D (CALCIUM + D PO) Take 1 tablet by mouth 2 (  two) times daily. Reported on 08/16/2015    [provider]  chlorthalidone (HYGROTON) 25 MG tablet Take 25 mg by mouth daily.    [provider]  enalapril (VASOTEC) 20 MG tablet Take 20 mg by mouth daily.    [provider]  HYDROcodone-acetaminophen (NORCO/VICODIN) 5-325 MG per tablet Take 1-2 tablets by mouth every 4 (four) hours as needed. Patient not taking: Reported on 08/16/2015 02/20/12   [name], PA-C  levothyroxine (SYNTHROID, LEVOTHROID) 50 MCG tablet Take 50 mcg by mouth daily.  10/27/13   [provider]  levothyroxine (SYNTHROID, LEVOTHROID) 75 MCG tablet Take 75 mcg by mouth daily.    [provider]  ondansetron (ZOFRAN ODT) 8 MG  disintegrating tablet Take 1 tablet (8 mg total) by mouth every 8 (eight) hours as needed for nausea or vomiting. 12/09/13   [name], MD  simvastatin (ZOCOR) 20 MG tablet Take 20 mg by mouth every evening.    [provider]    Family History Family History  Problem Relation Age of Onset  . Hypertension Other     Social History Social History  Substance Use Topics  . Smoking status: Never Smoker  . Smokeless tobacco: Never Used  . Alcohol use No     Allergies   Patient has no known allergies.   Review of Systems Review of Systems  Constitutional: Negative for activity change, appetite change and fever.  HENT: Negative for congestion and rhinorrhea.   Eyes: Negative for photophobia and visual disturbance.  Respiratory: Negative for cough, chest tightness and shortness of breath.   Cardiovascular: Negative for chest pain.  Gastrointestinal: Negative for abdominal pain, nausea and vomiting.  Genitourinary: Negative for vaginal bleeding and vaginal discharge.  Musculoskeletal: Negative for arthralgias and myalgias.  Skin: Negative for rash and wound.  Neurological: Positive for dizziness and headaches. Negative for speech difficulty.   all other systems are negative except as noted in the HPI and PMH.     Physical Exam Updated Vital Signs BP (!) 176/103   Pulse 81   Temp 98 F (36.7 C) (Oral)   Resp 14   Ht  (1.626 m)   Wt 61.3 kg (135 lb 2 oz)   SpO2 98%   BMI 23.19 kg/m   Physical Exam  Constitutional: She is oriented to person, place, and time. She appears well-developed and well-nourished. No distress.  HENT:  Head: Normocephalic and atraumatic.  Mouth/Throat: Oropharynx is clear and moist. No oropharyngeal exudate.  Eyes: Pupils are equal, round, and reactive to light. Conjunctivae and EOM are normal.  Neck: Normal range of motion. Neck supple.  No meningismus.  Cardiovascular: Normal rate, regular rhythm, normal heart sounds and intact  distal pulses.   No murmur heard. Pulmonary/Chest: Effort normal and breath sounds normal. No respiratory distress.  Abdominal: Soft. There is no tenderness. There is no rebound and no guarding.  Musculoskeletal: Normal range of motion. She exhibits no edema or tenderness.  Neurological: She is alert and oriented to person, place, and time. No cranial nerve deficit. She exhibits normal muscle tone. Coordination normal.  CN 2-12 intact, no ataxia on finger to nose, no nystagmus, 5/5 strength throughout, no pronator drift, Romberg negative, normal gait.   Skin: Skin is warm.  Psychiatric: She has a normal mood and affect. Her behavior is normal.  Nursing note and vitals reviewed.    ED Treatments / Results  Labs (all labs ordered are listed, but only abnormal results are displayed) Labs  Reviewed  BASIC METABOLIC PANEL - Abnormal; Notable for the following:       Result Value   Glucose, Bld 117 (*)    BUN 29 (*)    GFR calc non Af Amer 53 (*)    All other components within normal limits  URINALYSIS, ROUTINE W REFLEX MICROSCOPIC - Abnormal; Notable for the following:    Color, Urine STRAW (*)    All other components within normal limits  CBC WITH DIFFERENTIAL/PLATELET  TROPONIN I  TROPONIN I    EKG  EKG Interpretation  Date/Time:  Sunday October 26 2016 03:46:20 EDT Ventricular Rate:  86 PR Interval:    QRS Duration: 87 QT Interval:  384 QTC Calculation: 460 R Axis:   84 Text Interpretation:  Sinus rhythm Atrial premature complex Borderline right axis deviation Low voltage, precordial leads slight inferior ST depression Confirmed by Glynn Octave [phone]) on 10/26/2016 3:51:25 AM       Radiology Ct Head Wo Contrast  Result Date: 10/26/2016 CLINICAL DATA:  Headache. EXAM: CT HEAD WITHOUT CONTRAST TECHNIQUE: Contiguous axial images were obtained from the base of the skull through the vertex without intravenous contrast. COMPARISON:  Head CT 12/07/2013 FINDINGS: Brain:  Stable degree of atrophy from prior exam. Stable chronic small vessel ischemia with remote ischemia in the anterior limb of the right internal capsule. No intracranial hemorrhage, mass effect, or midline shift. No hydrocephalus. The basilar cisterns are patent. Basal gangliar calcifications are unchanged. No evidence of territorial infarct or acute ischemia. No extra-axial or intracranial fluid collection. Vascular: Atherosclerosis of skullbase vasculature without hyperdense vessel or abnormal calcification. Skull: No fracture or focal lesion. Sinuses/Orbits: Paranasal sinuses and mastoid air cells are clear. The visualized orbits are unremarkable. Bilateral cataract resection. Other: None. IMPRESSION: Atrophy and chronic small vessel ischemia without acute intracranial abnormality. Electronically Signed   By: Rubye Oaks M.D.   On: 10/26/2016 05:28    Procedures Procedures (including critical care time)  Medications Ordered in ED Medications - No data to display   Initial Impression / Assessment and Plan / ED Course  I have reviewed the triage vital signs and the nursing notes.  Pertinent labs & imaging results that were available during my care of the patient were reviewed by me and considered in my medical decision making (see chart for details).     Patient with 1 week of intermittent headaches and elevated blood pressure. Pressure 176/103 and arrival. Her neurological exam is nonfocal.  Hypertension without evidence of end organ damage.  Labs reassuring.  CT head negative.  Home BP meds given.  Remains hypertensive. Will add amlodipine.  No CP or SOB.  Headache improved without treatment.  Low suspicion for SAH, meningitis, temporal arteritis.   Second troponin pending. Denies chest pain. Advised to keep BP log and follow up with PCP.  Return to the ED with chest pain, shortness of breath or any other concerns.   Final Clinical Impressions(s) / ED Diagnoses   Final diagnoses:   Essential hypertension    New Prescriptions New Prescriptions   No medications on file     Glynn Octave, MD 10/26/16 0900

## 2016-10-26 NOTE — Discharge Instructions (Signed)
Take your blood pressure medications as prescribed. Add amlodipine. Keep a record of your blood pressure taken at the same time every day. Follow up with your doctor for further medication adjustments. Return to the ED if you develop chest pain, shortness of breath, or any other concerns.

## 2016-10-26 NOTE — ED Triage Notes (Signed)
Pt states that she was just recently placed back on some of her medications after she was taken off of some of them due to urinary retention, was seen at urgent care on 10/05/2016 with bp of 190/110 and was given 0.1 mg clonidine in the office for her bp, pt states that she has continued to have a headache since then, has not followed up with pcp,

## 2016-10-26 NOTE — ED Notes (Signed)
Assisted pt the bathroom.

## 2017-06-02 ENCOUNTER — Ambulatory Visit: Payer: Medicare HMO | Admitting: Urology

## 2017-06-02 DIAGNOSIS — N8110 Cystocele, unspecified: Secondary | ICD-10-CM

## 2017-07-21 ENCOUNTER — Ambulatory Visit: Payer: Medicare HMO | Admitting: Urology

## 2017-07-21 DIAGNOSIS — N8111 Cystocele, midline: Secondary | ICD-10-CM

## 2017-11-13 ENCOUNTER — Ambulatory Visit (INDEPENDENT_AMBULATORY_CARE_PROVIDER_SITE_OTHER): Payer: Medicare HMO | Admitting: Family Medicine

## 2017-11-13 ENCOUNTER — Encounter (INDEPENDENT_AMBULATORY_CARE_PROVIDER_SITE_OTHER): Payer: Self-pay | Admitting: Family Medicine

## 2017-11-13 VITALS — Wt 135.0 lb

## 2017-11-13 DIAGNOSIS — M79642 Pain in left hand: Secondary | ICD-10-CM

## 2017-11-13 DIAGNOSIS — M79641 Pain in right hand: Secondary | ICD-10-CM

## 2017-11-13 MED ORDER — PREDNISONE 10 MG PO TABS: ORAL_TABLET | ORAL | 0 refills | Status: DC

## 2017-11-13 NOTE — Progress Notes (Signed)
   Office Visit Note   Patient: Valerie Davenport           Date of Birth: [birth year]           MRN: [id number] Visit Date: 11/13/2017 Requested by: Burton Apley, MD [address] [place], [place] [address] PCP: Burton Apley, MD  Subjective: Chief Complaint  Patient presents with  . Right Hand - Numbness    R>L, numbness, pain, wakes her up at night, finger pops with opening things.  Right middle finger is bent. Onset x 1 month and has gotten worse  . Left Hand - Numbness    HPI: She is here with bilateral hand pain.  Symptoms started several months ago.  Numbness and tingling in her fingers with pain from the volar wrist into the fingers.  She has a history of trigger finger release in the past by Dr. Metro Kung.              ROS: Otherwise noncontributory  Objective: Vital Signs: Wt 135 lb (61.2 kg)   BMI 23.17 kg/m   Physical Exam:  Hands: Positive Tinel's at the carpal tunnel of both wrists.  Positive Phalen's test bilaterally.  No thenar atrophy.  She has what appears to be Dupuytren's contractures of the left hand.  Flexion contracture of her right third finger PIP joint.  Imaging: None today.  Assessment & Plan: 1.  Bilateral hand pain and numbness, suspicious for carpal tunnel syndrome -Night splints for the next 6 to 8 weeks.  Prednisone taper.  Return in 3 to 4 weeks if not improving, possibly x-rays and injection at that point.   Follow-Up Instructions: No follow-ups on file.       Procedures: None today.   PMFS History: Patient Active Problem List   Diagnosis Date Noted  . HNP (herniated nucleus pulposus), lumbar 02/20/2012    Class: Diagnosis of   Past Medical History:  Diagnosis Date  . Anxiety    but doesn't require meds  . Arthritis   . Chronic back pain    HNP-takes Tramadol and Pain Pill(at night)  . History of blood transfusion    no abnormal reaction noted  . History of migraine    last one about 73yrs ago  . History of shingles     . Hyperlipidemia    takes Simvastatin daily  . Hypertension    takes Enalapril and Chlorthalidone daily  . Hypothyroidism    takes Levothyroxine daily  . Joint pain   . Nocturia     Family History  Problem Relation Age of Onset  . Hypertension Other     Past Surgical History:  Procedure Laterality Date  . ABDOMINAL HYSTERECTOMY    . BACK SURGERY    . bilateral cataract surgery    . COLONOSCOPY    . LUMBAR LAMINECTOMY  02/20/2012  . LUMBAR LAMINECTOMY  02/20/2012   Procedure: MICRODISCECTOMY LUMBAR LAMINECTOMY;  Surgeon: Eldred Manges, MD;  Location: [facility] OR;  Service: Orthopedics;  Laterality: N/A;  Right L4 hemilaminectomy, microdiscectomy, removal of free fragment   Social History   Occupational History  . Not on file  Tobacco Use  . Smoking status: Never Smoker  . Smokeless tobacco: Never Used  Substance and Sexual Activity  . Alcohol use: No  . Drug use: No  . Sexual activity: Not Currently    Birth control/protection: Surgical

## 2017-12-08 ENCOUNTER — Ambulatory Visit: Payer: Medicare HMO | Admitting: Urology

## 2017-12-08 DIAGNOSIS — N8111 Cystocele, midline: Secondary | ICD-10-CM

## 2017-12-14 ENCOUNTER — Ambulatory Visit (INDEPENDENT_AMBULATORY_CARE_PROVIDER_SITE_OTHER): Payer: Medicare HMO | Admitting: Family Medicine

## 2017-12-14 ENCOUNTER — Encounter (INDEPENDENT_AMBULATORY_CARE_PROVIDER_SITE_OTHER): Payer: Self-pay | Admitting: Family Medicine

## 2017-12-14 VITALS — BP 98/59 | HR 90 | Temp 97.9°F | Resp 18 | Ht 64.0 in | Wt 128.2 lb

## 2017-12-14 DIAGNOSIS — R0981 Nasal congestion: Secondary | ICD-10-CM

## 2017-12-14 MED ORDER — AZITHROMYCIN 250 MG PO TABS: ORAL_TABLET | ORAL | 0 refills | Status: DC

## 2017-12-14 NOTE — Progress Notes (Signed)
Office Visit Note   Patient: Valerie Davenport           Date of Birth: May 18, 1931           MRN: [id number] Visit Date: 12/14/2017 Requested by: Burton Apley, MD [address] [place], [place] [address] PCP: Burton Apley, MD  Subjective: Chief Complaint  Patient presents with  . Sore Throat  . Cough  . right ear pain    HPI: She is an 84 year old with head congestion.  Symptoms started 3 or 4 days ago.  Head congestion with dizziness when standing.  Subjective fever, minimal cough.  Her husband recently got over a pneumonia.  Denies any vomiting or diarrhea.              ROS: Otherwise noncontributory  Objective: Vital Signs: BP (!) 98/59 (BP Location: Left Arm, Patient Position: Sitting, Cuff Size: Normal)   Pulse 90   Temp 97.9 F (36.6 C)   Resp 18   Ht 5\' 4"  (1.626 m)   Wt 128 lb 4 oz (58.2 kg)   SpO2 98%   BMI 22.01 kg/m   Physical Exam:  HEENT:  [place]/AT, PERRLA, EOM Full, no nystagmus.  Funduscopic examination within normal limits.  No conjunctival erythema.  Tympanic membranes are pearly gray with normal landmarks.  External ear canals are normal.  Nasal passages are congested.  Oropharynx is clear.  No significant lymphadenopathy.  No thyromegaly or nodules.  2+ carotid pulses without bruits. Lungs: Good air movement throughout with some inspiratory crackles in both lung bases.  Imaging: None today.  Assessment & Plan: 1.  Head congestion, suspect sinusitis.  Cannot rule out early pneumonia. -Drink plenty of fluids to maintain hydration.  Zithromax.  Chest x-ray if symptoms worsen.   Follow-Up Instructions: No follow-ups on file.      Procedures: No procedures performed  No notes on file    PMFS History: Patient Active Problem List   Diagnosis Date Noted  . HNP (herniated nucleus pulposus), lumbar 02/20/2012    Class: Diagnosis of   Past Medical History:  Diagnosis Date  . Anxiety    but doesn't require meds  . Arthritis   . Chronic back  pain    HNP-takes Tramadol and Pain Pill(at night)  . History of blood transfusion    no abnormal reaction noted  . History of migraine    last one about 25yrs ago  . History of shingles   . Hyperlipidemia    takes Simvastatin daily  . Hypertension    takes Enalapril and Chlorthalidone daily  . Hypothyroidism    takes Levothyroxine daily  . Joint pain   . Nocturia     Family History  Problem Relation Age of Onset  . Hypertension Other     Past Surgical History:  Procedure Laterality Date  . ABDOMINAL HYSTERECTOMY    . BACK SURGERY    . bilateral cataract surgery    . COLONOSCOPY    . LUMBAR LAMINECTOMY  02/20/2012  . LUMBAR LAMINECTOMY  02/20/2012   Procedure: MICRODISCECTOMY LUMBAR LAMINECTOMY;  Surgeon: Eldred Manges, MD;  Location: [facility] OR;  Service: Orthopedics;  Laterality: N/A;  Right L4 hemilaminectomy, microdiscectomy, removal of free fragment   Social History   Occupational History  . Not on file  Tobacco Use  . Smoking status: Never Smoker  . Smokeless tobacco: Never Used  Substance and Sexual Activity  . Alcohol use: No  . Drug use: No  . Sexual activity:  Not Currently    Birth control/protection: Surgical

## 2018-01-25 ENCOUNTER — Other Ambulatory Visit: Payer: Self-pay | Admitting: Internal Medicine

## 2018-01-25 DIAGNOSIS — Z1231 Encounter for screening mammogram for malignant neoplasm of breast: Secondary | ICD-10-CM

## 2018-02-02 ENCOUNTER — Ambulatory Visit: Payer: Medicare HMO | Admitting: Urology

## 2018-02-02 DIAGNOSIS — N8111 Cystocele, midline: Secondary | ICD-10-CM

## 2018-02-12 ENCOUNTER — Ambulatory Visit: Payer: Medicare HMO | Admitting: Obstetrics & Gynecology

## 2018-02-12 ENCOUNTER — Encounter: Payer: Self-pay | Admitting: Obstetrics & Gynecology

## 2018-02-12 VITALS — BP 117/70 | HR 84 | Ht 64.0 in | Wt 129.0 lb

## 2018-02-12 DIAGNOSIS — N819 Female genital prolapse, unspecified: Secondary | ICD-10-CM | POA: Diagnosis not present

## 2018-02-21 ENCOUNTER — Encounter: Payer: Self-pay | Admitting: Obstetrics & Gynecology

## 2018-02-21 NOTE — Progress Notes (Signed)
Chief Complaint  Patient presents with  . Bladder Prolapse    pessary falling out      85 y.o. [id number] No LMP recorded. Patient has had a hysterectomy. The current method of family planning is status post hysterectomy.  Outpatient Encounter Medications as of 02/12/2018  Medication Sig Note  . ALPRAZolam (XANAX) 0.25 MG tablet Take 1 tablet by mouth daily as needed for anxiety or sleep. Reported on 08/16/2015 12/07/2013: Received from: External Pharmacy Received Sig:   . amLODipine (NORVASC) 5 MG tablet Take 1 tablet (5 mg total) by mouth daily.   . enalapril (VASOTEC) 20 MG tablet Take 20 mg by mouth daily.   Marland Kitchen. HYDROcodone-acetaminophen (NORCO/VICODIN) 5-325 MG per tablet Take 1-2 tablets by mouth every 4 (four) hours as needed.   Marland Kitchen. levothyroxine (SYNTHROID, LEVOTHROID) 50 MCG tablet Take 50 mcg by mouth daily.  12/07/2013: Received from: External Pharmacy  . simvastatin (ZOCOR) 20 MG tablet Take 20 mg by mouth every evening.   . [DISCONTINUED] aspirin EC 81 MG tablet Take 81 mg by mouth daily.   . [DISCONTINUED] azithromycin (ZITHROMAX Z-PAK) 250 MG tablet Take as directed.   . [DISCONTINUED] bacitracin-polymyxin b (POLYSPORIN) ophthalmic ointment Place 1 application into the right eye 2 (two) times daily. apply to eye every 12 hours while awake x 5 days (Patient not taking: Reported on 08/16/2015)   . [DISCONTINUED] Calcium Carbonate-Vitamin D (CALCIUM + D PO) Take 1 tablet by mouth 2 (two) times daily. Reported on 08/16/2015   . [DISCONTINUED] chlorthalidone (HYGROTON) 25 MG tablet Take 25 mg by mouth daily.   . [DISCONTINUED] levothyroxine (SYNTHROID, LEVOTHROID) 75 MCG tablet Take 75 mcg by mouth daily.   . [DISCONTINUED] ondansetron (ZOFRAN ODT) 8 MG disintegrating tablet Take 1 tablet (8 mg total) by mouth every 8 (eight) hours as needed for nausea or vomiting. (Patient not taking: Reported on 11/13/2017)   . [DISCONTINUED] predniSONE (DELTASONE) 10 MG tablet Take as directed  for 12 days.  Daily dose 6,6,5,5,4,4,3,3,2,2,1,1. (Patient not taking: Reported on 12/14/2017)    No facility-administered encounter medications on file as of 02/12/2018.     Subjective Pt is seen as referral from [facility] for POP, specifically cystocoele Notes reviewed Past Medical History:  Diagnosis Date  . Anxiety    but doesn't require meds  . Arthritis   . Chronic back pain    HNP-takes Tramadol and Pain Pill(at night)  . History of blood transfusion    no abnormal reaction noted  . History of migraine    last one about 5724yrs ago  . History of shingles   . Hyperlipidemia    takes Simvastatin daily  . Hypertension    takes Enalapril and Chlorthalidone daily  . Hypothyroidism    takes Levothyroxine daily  . Joint pain   . Nocturia     Past Surgical History:  Procedure Laterality Date  . ABDOMINAL HYSTERECTOMY    . BACK SURGERY    . bilateral cataract surgery    . COLONOSCOPY    . LUMBAR LAMINECTOMY  02/20/2012  . LUMBAR LAMINECTOMY  02/20/2012   Procedure: MICRODISCECTOMY LUMBAR LAMINECTOMY;  Surgeon: Eldred MangesMark C Yates, MD;  [facility];  Service: Orthopedics;  Laterality: N/A;  Right L4 hemilaminectomy, microdiscectomy, removal of free fragment    OB History    Gravida  6   Para  5   Term  5   Preterm      AB  1   Living  SAB  1   TAB      Ectopic      Multiple      Live Births              No Known Allergies  Social History   Socioeconomic History  . Marital status: Married    Spouse name: Not on file  . Number of children: Not on file  . Years of education: Not on file  . Highest education level: Not on file  Occupational History  . Not on file  Social Needs  . Financial resource strain: Not on file  . Food insecurity:    Worry: Not on file    Inability: Not on file  . Transportation needs:    Medical: Not on file    Non-medical: Not on file  Tobacco Use  . Smoking status: Never Smoker  . Smokeless tobacco:  Never Used  Substance and Sexual Activity  . Alcohol use: No  . Drug use: No  . Sexual activity: Not Currently    Birth control/protection: Surgical    Comment: hyst  Lifestyle  . Physical activity:    Days per week: Not on file    Minutes per session: Not on file  . Stress: Not on file  Relationships  . Social connections:    Talks on phone: Not on file    Gets together: Not on file    Attends religious service: Not on file    Active member of club or organization: Not on file    Attends meetings of clubs or organizations: Not on file    Relationship status: Not on file  Other Topics Concern  . Not on file  Social History Narrative  . Not on file    Family History  Problem Relation Age of Onset  . Hypertension Other   . Liver cancer Daughter   . Heart disease Daughter   . Other Son        drowning  . COPD Son     Medications:       Current Outpatient Medications:  .  ALPRAZolam (XANAX) 0.25 MG tablet, Take 1 tablet by mouth daily as needed for anxiety or sleep. Reported on 08/16/2015, Disp: , Rfl: 5 .  amLODipine (NORVASC) 5 MG tablet, Take 1 tablet (5 mg total) by mouth daily., Disp: 30 tablet, Rfl: 0 .  enalapril (VASOTEC) 20 MG tablet, Take 20 mg by mouth daily., Disp: , Rfl:  .  HYDROcodone-acetaminophen (NORCO/VICODIN) 5-325 MG per tablet, Take 1-2 tablets by mouth every 4 (four) hours as needed., Disp: 60 tablet, Rfl: 0 .  levothyroxine (SYNTHROID, LEVOTHROID) 50 MCG tablet, Take 50 mcg by mouth daily. , Disp: , Rfl: 4 .  simvastatin (ZOCOR) 20 MG tablet, Take 20 mg by mouth every evening., Disp: , Rfl:   Objective Blood pressure 117/70, pulse 84, height 5\' 4"  (1.626 m), weight 129 lb (58.5 kg).  Grade IV vaginal apex prolapse with secondary cystocoele Milex ring with support #5v fit for patient with acceptable fit nd comfort One in office stock given to patient as well  Pertinent ROS No burning with urination, frequency or urgency No nausea, vomiting or  diarrhea Nor fever chills or other constitutional symptoms   Labs or studies     Impression Diagnoses this Encounter::   ICD-10-CM   1. Vaginal vault prolapse N81.9     Established relevant diagnosis(es):   Plan/Recommendations: No orders of the defined types were placed in this encounter.  Labs or Scans Ordered: No orders of the defined types were placed in this encounter.   Management:: >Milex ring with support #5 fit for patient >instructions given  >follow up 1 months  Follow up Return in about 1 month (around 03/15/2018) for Follow up, with Dr Despina Hidden.      All questions were answered.

## 2018-03-01 ENCOUNTER — Ambulatory Visit: Payer: Medicare HMO

## 2018-03-19 ENCOUNTER — Ambulatory Visit: Payer: Medicare HMO | Admitting: Obstetrics & Gynecology

## 2018-03-19 ENCOUNTER — Encounter: Payer: Self-pay | Admitting: Obstetrics & Gynecology

## 2018-03-19 VITALS — BP 136/80 | HR 85 | Ht 64.0 in | Wt 128.5 lb

## 2018-03-19 DIAGNOSIS — Z4689 Encounter for fitting and adjustment of other specified devices: Secondary | ICD-10-CM

## 2018-03-19 DIAGNOSIS — N819 Female genital prolapse, unspecified: Secondary | ICD-10-CM | POA: Diagnosis not present

## 2018-03-19 NOTE — Progress Notes (Signed)
Chief Complaint  Patient presents with  . Follow-up    on pessary    Blood pressure 136/80, pulse 85, height 5\' 4"  (1.626 m), weight 128 lb 8 oz (58.3 kg).  Valerie Davenport presents today for routine follow up related to her pessary.   She uses a Milex ring with support #5 She reports no vaginal discharge or vaginal bleeding.  Exam reveals no undue vaginal mucosal pressure of breakdown, no discharge and no vaginal bleeding.  The pessary is removed, cleaned and replaced without difficulty.    Valerie Davenport will be sen back in 3 months for continued follow up.  Lazaro Arms, MD  03/19/2018 10:57 AM

## 2018-03-29 ENCOUNTER — Ambulatory Visit
Admission: RE | Admit: 2018-03-29 | Discharge: 2018-03-29 | Disposition: A | Discharge status: Home / Self Care | Payer: Medicare HMO | Source: Ambulatory Visit | Attending: Internal Medicine | Admitting: Internal Medicine

## 2018-03-29 DIAGNOSIS — Z1231 Encounter for screening mammogram for malignant neoplasm of breast: Secondary | ICD-10-CM

## 2018-06-17 ENCOUNTER — Ambulatory Visit: Payer: Self-pay | Admitting: Obstetrics & Gynecology

## 2018-08-09 ENCOUNTER — Telehealth: Payer: Self-pay | Admitting: Obstetrics & Gynecology

## 2018-08-09 NOTE — Telephone Encounter (Signed)
Tried to reach patient with restrictions, no voice mail and patient did not answer.

## 2018-08-10 ENCOUNTER — Ambulatory Visit (INDEPENDENT_AMBULATORY_CARE_PROVIDER_SITE_OTHER): Payer: Medicare HMO | Admitting: Obstetrics & Gynecology

## 2018-08-10 ENCOUNTER — Encounter: Payer: Self-pay | Admitting: Obstetrics & Gynecology

## 2018-08-10 ENCOUNTER — Other Ambulatory Visit: Payer: Self-pay

## 2018-08-10 VITALS — BP 110/69 | HR 80 | Ht 64.0 in | Wt 123.0 lb

## 2018-08-10 DIAGNOSIS — N819 Female genital prolapse, unspecified: Secondary | ICD-10-CM | POA: Diagnosis not present

## 2018-08-10 DIAGNOSIS — Z4689 Encounter for fitting and adjustment of other specified devices: Secondary | ICD-10-CM | POA: Diagnosis not present

## 2018-08-10 NOTE — Progress Notes (Signed)
Chief Complaint  Patient presents with  . Pessary Check    Blood pressure 110/69, pulse 80, height 5\' 4"  (1.626 m), weight 123 lb (55.8 kg).  Valerie Davenport presents today for routine follow up related to her pessary.   She uses a Milex ring with support #5 She reports no vaginal discharge or vaginal bleeding.  Exam reveals no undue vaginal mucosal pressure of breakdown, no discharge and no vaginal bleeding.  The pessary is removed, cleaned and replaced without difficulty.    Valerie Davenport will be sen back in 4 months for continued follow up.  Florian Buff, MD  08/10/2018 10:07 AM

## 2018-11-10 ENCOUNTER — Ambulatory Visit (INDEPENDENT_AMBULATORY_CARE_PROVIDER_SITE_OTHER): Payer: Medicare HMO | Admitting: Family Medicine

## 2018-11-10 ENCOUNTER — Other Ambulatory Visit: Payer: Self-pay

## 2018-11-10 ENCOUNTER — Encounter: Payer: Self-pay | Admitting: Family Medicine

## 2018-11-10 VITALS — BP 160/90 | HR 75

## 2018-11-10 DIAGNOSIS — R519 Headache, unspecified: Secondary | ICD-10-CM | POA: Diagnosis not present

## 2018-11-10 DIAGNOSIS — R42 Dizziness and giddiness: Secondary | ICD-10-CM

## 2018-11-10 DIAGNOSIS — I10 Essential (primary) hypertension: Secondary | ICD-10-CM | POA: Diagnosis not present

## 2018-11-10 NOTE — Progress Notes (Signed)
Office Visit Note   Patient: Valerie Davenport           Date of Birth: 04/05/1931           MRN: [id number] Visit Date: 11/10/2018 Requested by: Burton Apley, MD [address] [place],  [place] [address] PCP: Burton Apley, MD  Subjective: Chief Complaint  Patient presents with  . Blood Pressure Check  . dizziness after BP meds were stopped by PCP    HPI: She is here with headache and dizziness.  Recently, probably a month ago, she went to her PCP for a visit.  It was done in the parking lot, and the nurse checked her blood pressure and reported it to Dr. Su Hilt.  Apparently he was concerned about the reading, and recommended stopping her medications.  Since then her blood pressure has been climbing and now she is feeling dizzy when she stands, and has frequent headaches.  Denies any chest pain or palpitations.              ROS: No fevers or chills.  All other systems were reviewed and are negative.  Objective: Vital Signs: BP (!) 160/90 (BP Location: Left Arm, Patient Position: Sitting, Cuff Size: Normal)   Pulse 75   Physical Exam:  General:  Alert and oriented, in no acute distress. Pulm:  Breathing unlabored. Psy:  Normal mood, congruent affect. Skin: No visible rash. Neck: Carotid pulses are 1+ with no audible bruits. CV: Regular rate and rhythm with occasional ectopic beats, without murmurs, rubs, or gallops.  No peripheral edema.  2+ radial and posterior tibial pulses. Lungs: Clear to auscultation throughout with no wheezing or areas of consolidation.    Imaging: None today.  Assessment & Plan: 1.  Hypertension, now poorly controlled.  Headaches and dizziness could be related to this. -She will resume her medications at previous dosages.  She has an appointment in about 2 weeks with her PCP for a wellness examination so she will have her blood pressure reevaluated then.  She will start checking it at home as well.     Procedures: No procedures performed   No notes on file     PMFS History: Patient Active Problem List   Diagnosis Date Noted  . Essential hypertension 11/10/2018  . HNP (herniated nucleus pulposus), lumbar 02/20/2012    Class: Diagnosis of   Past Medical History:  Diagnosis Date  . Anxiety    but doesn't require meds  . Arthritis   . Chronic back pain    HNP-takes Tramadol and Pain Pill(at night)  . History of blood transfusion    no abnormal reaction noted  . History of migraine    last one about 45yrs ago  . History of shingles   . Hyperlipidemia    takes Simvastatin daily  . Hypertension    takes Enalapril and Chlorthalidone daily  . Hypothyroidism    takes Levothyroxine daily  . Joint pain   . Nocturia     Family History  Problem Relation Age of Onset  . Hypertension Other   . Liver cancer Daughter   . Heart disease Daughter   . Other Son        drowning  . COPD Son   . Breast cancer Neg Hx     Past Surgical History:  Procedure Laterality Date  . ABDOMINAL HYSTERECTOMY    . BACK SURGERY    . bilateral cataract surgery    . COLONOSCOPY    . LUMBAR  LAMINECTOMY  02/20/2012  . LUMBAR LAMINECTOMY  02/20/2012   Procedure: MICRODISCECTOMY LUMBAR LAMINECTOMY;  Surgeon: Marybelle Killings, MD;  Location: [facility];  Service: Orthopedics;  Laterality: N/A;  Right L4 hemilaminectomy, microdiscectomy, removal of free fragment   Social History   Occupational History  . Not on file  Tobacco Use  . Smoking status: Never Smoker  . Smokeless tobacco: Never Used  Substance and Sexual Activity  . Alcohol use: No  . Drug use: No  . Sexual activity: Not Currently    Birth control/protection: Surgical    Comment: hyst

## 2018-12-13 ENCOUNTER — Ambulatory Visit (INDEPENDENT_AMBULATORY_CARE_PROVIDER_SITE_OTHER): Payer: Medicare HMO | Admitting: Obstetrics & Gynecology

## 2018-12-13 ENCOUNTER — Other Ambulatory Visit: Payer: Self-pay

## 2018-12-13 ENCOUNTER — Encounter: Payer: Self-pay | Admitting: Obstetrics & Gynecology

## 2018-12-13 VITALS — BP 128/73 | HR 79 | Ht 64.0 in | Wt 120.0 lb

## 2018-12-13 DIAGNOSIS — Z4689 Encounter for fitting and adjustment of other specified devices: Secondary | ICD-10-CM | POA: Diagnosis not present

## 2018-12-13 DIAGNOSIS — N819 Female genital prolapse, unspecified: Secondary | ICD-10-CM | POA: Diagnosis not present

## 2018-12-13 NOTE — Progress Notes (Signed)
Chief Complaint  Patient presents with  . Pessary Check    Blood pressure 128/73, pulse 79, height 5\' 4"  (1.626 m), weight 120 lb (54.4 kg).  Valerie Davenport presents today for routine follow up related to her pessary.   She uses a Milex ring with support #5 She reports no vaginal discharge or vaginal bleeding.  Exam reveals no undue vaginal mucosal pressure of breakdown, no discharge and no vaginal bleeding.  The pessary is removed, cleaned and replaced without difficulty.    [name] will be sen back in 4 months for continued follow up.  Florian Buff, MD  12/13/2018 9:30 AM

## 2019-01-27 ENCOUNTER — Emergency Department (HOSPITAL_COMMUNITY)
Admission: EM | Admit: 2019-01-27 | Discharge: 2019-01-27 | Disposition: A | Discharge status: Home / Self Care | Payer: Medicare HMO | Attending: Emergency Medicine | Admitting: Emergency Medicine

## 2019-01-27 ENCOUNTER — Encounter (HOSPITAL_COMMUNITY): Payer: Self-pay | Admitting: Emergency Medicine

## 2019-01-27 ENCOUNTER — Telehealth: Payer: Self-pay | Admitting: Family Medicine

## 2019-01-27 ENCOUNTER — Emergency Department (HOSPITAL_BASED_OUTPATIENT_CLINIC_OR_DEPARTMENT_OTHER): Payer: Medicare HMO

## 2019-01-27 ENCOUNTER — Ambulatory Visit (INDEPENDENT_AMBULATORY_CARE_PROVIDER_SITE_OTHER)
Admission: EM | Admit: 2019-01-27 | Discharge: 2019-01-27 | Disposition: A | Discharge status: Home / Self Care | Payer: Medicare HMO | Source: Home / Self Care

## 2019-01-27 DIAGNOSIS — Z79899 Other long term (current) drug therapy: Secondary | ICD-10-CM | POA: Diagnosis not present

## 2019-01-27 DIAGNOSIS — M79662 Pain in left lower leg: Secondary | ICD-10-CM

## 2019-01-27 DIAGNOSIS — E039 Hypothyroidism, unspecified: Secondary | ICD-10-CM | POA: Diagnosis not present

## 2019-01-27 DIAGNOSIS — I10 Essential (primary) hypertension: Secondary | ICD-10-CM | POA: Diagnosis not present

## 2019-01-27 DIAGNOSIS — R52 Pain, unspecified: Secondary | ICD-10-CM

## 2019-01-27 DIAGNOSIS — M79605 Pain in left leg: Secondary | ICD-10-CM

## 2019-01-27 NOTE — Discharge Instructions (Signed)
You were sent from urgent care today for an ultrasound to rule out a blood clot in your left calf causing your pain.  The ultrasound was negative and there is no blood clot.  We suspect you have musculoskeletal pain.  Please use over-the-counter medications and follow-up with your primary team for further management of your pain.  If any symptoms change or worsen, please return to the nearest emergency department.

## 2019-01-27 NOTE — ED Triage Notes (Signed)
Pt here for elevated BP and left leg pain

## 2019-01-27 NOTE — Telephone Encounter (Signed)
Valerie Davenport pt daughter in law is calling in due to the pt blood pressure being extremely high and she is wondering if she needs to come in, the medication prescribed by hilts isnt helping her at this time. Please give her a call   [phone]

## 2019-01-27 NOTE — ED Provider Notes (Signed)
[facility] EMERGENCY DEPARTMENT Provider Note   CSN: [id number] Arrival date & time: 01/27/19  1649     History Chief Complaint  Patient presents with  . Leg Pain    Valerie Davenport is a 85 y.o. female.  The history is provided by the patient and medical records. No language interpreter was used.  Leg Pain Location:  Leg Time since incident:  1 week Injury: no   Leg location:  L lower leg Pain details:    Quality:  Aching and cramping   Radiates to:  Does not radiate   Severity:  Moderate   Onset quality:  Gradual   Timing:  Constant   Progression:  Unchanged Chronicity:  New Dislocation: no   Prior injury to area:  No Relieved by:  Nothing Worsened by:  Nothing Ineffective treatments:  None tried Associated symptoms: no back pain, no fatigue, no fever and no neck pain        Past Medical History:  Diagnosis Date  . Anxiety    but doesn't require meds  . Arthritis   . Chronic back pain    HNP-takes Tramadol and Pain Pill(at night)  . History of blood transfusion    no abnormal reaction noted  . History of migraine    last one about 80yrs ago  . History of shingles   . Hyperlipidemia    takes Simvastatin daily  . Hypertension    takes Enalapril and Chlorthalidone daily  . Hypothyroidism    takes Levothyroxine daily  . Joint pain   . Nocturia     Patient Active Problem List   Diagnosis Date Noted  . Essential hypertension 11/10/2018  . HNP (herniated nucleus pulposus), lumbar 02/20/2012    Class: Diagnosis of    Past Surgical History:  Procedure Laterality Date  . ABDOMINAL HYSTERECTOMY    . BACK SURGERY    . bilateral cataract surgery    . COLONOSCOPY    . LUMBAR LAMINECTOMY  02/20/2012  . LUMBAR LAMINECTOMY  02/20/2012   Procedure: MICRODISCECTOMY LUMBAR LAMINECTOMY;  Surgeon: Marybelle Killings, MD;  Location: [facility];  Service: Orthopedics;  Laterality: N/A;  Right L4 hemilaminectomy, microdiscectomy, removal of free fragment      OB History    Gravida  6   Para  5   Term  5   Preterm      AB  1   Living        SAB  1   TAB      Ectopic      Multiple      Live Births              Family History  Problem Relation Age of Onset  . Hypertension Other   . Liver cancer Daughter   . Heart disease Daughter   . Other Son        drowning  . COPD Son   . Breast cancer Neg Hx     Social History   Tobacco Use  . Smoking status: Never Smoker  . Smokeless tobacco: Never Used  Substance Use Topics  . Alcohol use: No  . Drug use: No    Home Medications Prior to Admission medications   Medication Sig Start Date End Date Taking? Authorizing Provider  enalapril (VASOTEC) 20 MG tablet Take 20 mg by mouth daily.    [provider]  HYDROcodone-acetaminophen (NORCO/VICODIN) 5-325 MG per tablet Take 1-2 tablets by mouth every 4 (four) hours  as needed. 02/20/12   [name], PA-C  levothyroxine (SYNTHROID, LEVOTHROID) 50 MCG tablet Take 50 mcg by mouth daily.  10/27/13   [provider]  simvastatin (ZOCOR) 20 MG tablet Take 20 mg by mouth every evening.    [provider]  amLODipine (NORVASC) 5 MG tablet Take 1 tablet (5 mg total) by mouth daily. Patient not taking: Reported on 11/10/2018 10/26/16 01/27/19  Glynn Octave, MD    Allergies    Patient has no known allergies.  Review of Systems   Review of Systems  Constitutional: Negative for chills, diaphoresis, fatigue and fever.  Respiratory: Negative for cough and shortness of breath.   Cardiovascular: Negative for chest pain.  Gastrointestinal: Negative for abdominal pain.  Musculoskeletal: Negative for back pain and neck pain.  Skin: Negative for rash and wound.  Neurological: Negative for headaches.  Psychiatric/Behavioral: Negative for agitation.  All other systems reviewed and are negative.   Physical Exam Updated Vital Signs BP (!) 156/79 (BP Location: Right Arm)   Pulse 91   Temp 97.7 F  (36.5 C) (Oral)   Resp 16   SpO2 97%   Physical Exam Vitals and nursing note reviewed.  Constitutional:      General: She is not in acute distress.    Appearance: She is normal weight. She is not ill-appearing, toxic-appearing or diaphoretic.  HENT:     Head: Normocephalic.  Eyes:     Conjunctiva/sclera: Conjunctivae normal.  Cardiovascular:     Pulses: Normal pulses.     Heart sounds: No murmur.  Pulmonary:     Effort: Pulmonary effort is normal.     Breath sounds: No wheezing or rhonchi.  Chest:     Chest wall: No tenderness.  Abdominal:     General: Abdomen is flat.     Tenderness: There is no abdominal tenderness.  Musculoskeletal:        General: Tenderness present.     Right lower leg: No edema.     Left lower leg: No edema.       Legs:     Comments: Normal sensation, strength, and pulse in legs.  Mild tenderness in left calf.  No tenderness of knee or hip.  Normal appearance.  Skin:    Capillary Refill: Capillary refill takes less than 2 seconds.     Findings: No erythema or rash.  Neurological:     General: No focal deficit present.     Mental Status: She is alert.  Psychiatric:        Mood and Affect: Mood normal.     ED Results / Procedures / Treatments   Labs (all labs ordered are listed, but only abnormal results are displayed) Labs Reviewed - No data to display  EKG None  Radiology VAS [place] LOWER EXTREMITY VENOUS (DVT) (ONLY [facility] & [facility])  Result Date: 01/27/2019  Lower Venous Study Indications: Pain, and suboptimal patient cooperation secondary to patient being hard of hearing.  Comparison Study: No prior study Performing Technologist: Gertie Fey MHA, RDMS, RVT, RDCS  Examination Guidelines: A complete evaluation includes B-mode imaging, spectral Doppler, color Doppler, and power Doppler as needed of all accessible portions of each vessel. Bilateral testing is considered an integral part of a complete examination. Limited examinations for  reoccurring indications may be performed as noted.  +-----+---------------+---------+-----------+----------+--------------+ RIGHTCompressibilityPhasicitySpontaneityPropertiesThrombus Aging +-----+---------------+---------+-----------+----------+--------------+ CFV  Not visualized +-----+---------------+---------+-----------+----------+--------------+   +---------+---------------+---------+-----------+----------+--------------+ LEFT     CompressibilityPhasicitySpontaneityPropertiesThrombus Aging +---------+---------------+---------+-----------+----------+--------------+ CFV      Full           Yes      Yes                                 +---------+---------------+---------+-----------+----------+--------------+ SFJ      Full                                                        +---------+---------------+---------+-----------+----------+--------------+ FV Prox  Full                                                        +---------+---------------+---------+-----------+----------+--------------+ FV Mid   Full                                                        +---------+---------------+---------+-----------+----------+--------------+ FV DistalFull                                                        +---------+---------------+---------+-----------+----------+--------------+ PFV      Full                                                        +---------+---------------+---------+-----------+----------+--------------+ POP      Full           Yes      Yes                                 +---------+---------------+---------+-----------+----------+--------------+ PTV      Full                                                        +---------+---------------+---------+-----------+----------+--------------+ PERO     Full                                                         +---------+---------------+---------+-----------+----------+--------------+     Summary: Left: There is no evidence of deep vein thrombosis in the lower extremity. No cystic structure found in the popliteal fossa.  *See table(s) above for measurements and observations.    Preliminary  Procedures Procedures (including critical care time)  Medications Ordered in ED Medications - No data to display  ED Course  I have reviewed the triage vital signs and the nursing notes.  Pertinent labs & imaging results that were available during my care of the patient were reviewed by me and considered in my medical decision making (see chart for details).    MDM Rules/Calculators/A&P                      Valerie Davenport is a 85 y.o. female with a past medical history significant for hypertension, hyperlipidemia, chronic back pain, joint pain, and shingles who presents from urgent care for evaluation of left calf pain.  Patient was sent to this emergency department for ultrasound of the leg to rule out DVT for left calf pain.  She has had pain for around 1 to 2 weeks in the left calf and denies injury.  She reports the pain is aching and cramping.  She has not had this before.  She denies any pain in the foot knee or hip.  It is all in the calf.  She denies any other complaints including no fevers, chills, chest pain, shortness of breath, or other symptoms.  No history of DVT or PE.  On exam, patient does have some tenderness in her left calf.  Is not significantly swollen.  No rashes.  No fluctuance palpated.  Good sensation and strength in foot.  Good pulses.  Exam otherwise unremarkable with clear lungs and nontender chest or abdomen.  Ultrasound was completed and was negative for DVT or other abnormality.  Patient was relieved with the negative ultrasound and will be discharged home to use conservative outpatient management with over-the-counter medications and follow-up with her PCP for the likely  muscular calf pain.  She agreed with plan of care and was discharged in good condition after reassuring ultrasound.    Final Clinical Impression(s) / ED Diagnoses Final diagnoses:  Left leg pain    Rx / DC Orders ED Discharge Orders    None     Clinical Impression: 1. Left leg pain     Disposition: Discharge  Condition: Good  I have discussed the results, Dx and Tx plan with the pt(& family if present). He/she/they expressed understanding and agree(s) with the plan. Discharge instructions discussed at great length. Strict return precautions discussed and pt &/or family have verbalized understanding of the instructions. No further questions at time of discharge.    New Prescriptions   No medications on file    Follow Up: Burton ApleyRoberts, Ronald, MD [address], [place] [address] [place] [id number] [phone]     [facility] [address] [id number] [address] [place] [id number] [phone]       Verneal Wiers, Canary Brimhristopher J, MD 01/27/19 2111

## 2019-01-27 NOTE — Telephone Encounter (Signed)
I called and spoke with Valerie Davenport: the patient called her son (Therese's husband) this morning, saying she did not feel well and her BP by the readings on her home machine was 158/127. Valerie Davenport and Valerie Davenport were under the impression Valerie Davenport had prescribed her blood pressure medications. He only advised her to go back on her medications when she came in this office with headaches and dizziness. Valerie Davenport, the patient's PCP, was the one that had stopped those medications prior to that visit here. She saw her PCP again shortly after that visit here, for a wellness check - we do not have those notes in her chart here. I advised Valerie Davenport to call Valerie Davenport' office to see if they can see her today or get her to an urgent care to have her evaluated (Valerie Davenport is only her for 2 more hours today and we are not open tomorrow, due to the holiday). She said she will call her husband and he or she will get in touch with Valerie Davenport.

## 2019-01-27 NOTE — ED Triage Notes (Signed)
Pt. Stated, left leg pain for 1-2 weks and sometimes hard to walk

## 2019-01-27 NOTE — Discharge Instructions (Signed)
Recommending further evaluation and management in the ED.  Cannot rule out blood clot in urgent care setting.  Patient family made aware.  Will go by private vehicle to ED.

## 2019-01-27 NOTE — Progress Notes (Signed)
Left lower extremity venous duplex completed. Refer to "CV Proc" under chart review to view preliminary results.  01/27/2019 7:07 PM Kelby Aline., MHA, RVT, RDCS, RDMS

## 2019-01-27 NOTE — ED Provider Notes (Addendum)
[facility]   [id number] 01/27/19 Arrival Time: 4098  CC: LLE  SUBJECTIVE: Patient is HOH and poor historian  History from: patient. Valerie Davenport is a 85 y.o. female complains of LLE pain that began 1 day ago.  Denies a precipitating event or specific injury.  Localizes the pain to the calf.  Describes the pain as intermittent and achy in character.  Has tried OTC ibuprofen with minimal relief.  Symptoms are made worse with walking.  Denies similar symptoms in the past.  Complains of associated LLE swelling.  Denies fever, chills, CP, SOB, erythema, ecchymosis, weakness.  Hx of blood clots, or cancer.    ROS: As per HPI.  All other pertinent ROS negative.     Past Medical History:  Diagnosis Date  . Anxiety    but doesn't require meds  . Arthritis   . Chronic back pain    HNP-takes Tramadol and Pain Pill(at night)  . History of blood transfusion    no abnormal reaction noted  . History of migraine    last one about 41yrs ago  . History of shingles   . Hyperlipidemia    takes Simvastatin daily  . Hypertension    takes Enalapril and Chlorthalidone daily  . Hypothyroidism    takes Levothyroxine daily  . Joint pain   . Nocturia    Past Surgical History:  Procedure Laterality Date  . ABDOMINAL HYSTERECTOMY    . BACK SURGERY    . bilateral cataract surgery    . COLONOSCOPY    . LUMBAR LAMINECTOMY  02/20/2012  . LUMBAR LAMINECTOMY  02/20/2012   Procedure: MICRODISCECTOMY LUMBAR LAMINECTOMY;  Surgeon: Marybelle Killings, MD;  Location: [facility];  Service: Orthopedics;  Laterality: N/A;  Right L4 hemilaminectomy, microdiscectomy, removal of free fragment   No Known Allergies No current facility-administered medications on file prior to encounter.   Current Outpatient Medications on File Prior to Encounter  Medication Sig Dispense Refill  . enalapril (VASOTEC) 20 MG tablet Take 20 mg by mouth daily.    Marland Kitchen HYDROcodone-acetaminophen (NORCO/VICODIN) 5-325 MG per tablet Take 1-2  tablets by mouth every 4 (four) hours as needed. 60 tablet 0  . levothyroxine (SYNTHROID, LEVOTHROID) 50 MCG tablet Take 50 mcg by mouth daily.   4  . simvastatin (ZOCOR) 20 MG tablet Take 20 mg by mouth every evening.    . [DISCONTINUED] amLODipine (NORVASC) 5 MG tablet Take 1 tablet (5 mg total) by mouth daily. (Patient not taking: Reported on 11/10/2018) 30 tablet 0   Social History   Socioeconomic History  . Marital status: Widowed    Spouse name: Not on file  . Number of children: Not on file  . Years of education: Not on file  . Highest education level: Not on file  Occupational History  . Not on file  Tobacco Use  . Smoking status: Never Smoker  . Smokeless tobacco: Never Used  Substance and Sexual Activity  . Alcohol use: No  . Drug use: No  . Sexual activity: Not Currently    Birth control/protection: Surgical    Comment: hyst  Other Topics Concern  . Not on file  Social History Narrative  . Not on file   Social Determinants of Health   Financial Resource Strain:   . Difficulty of Paying Living Expenses: Not on file  Food Insecurity:   . Worried About Charity fundraiser in the Last Year: Not on file  . Ran Out of Food in  the Last Year: Not on file  Transportation Needs:   . Lack of Transportation (Medical): Not on file  . Lack of Transportation (Non-Medical): Not on file  Physical Activity:   . Days of Exercise per Week: Not on file  . Minutes of Exercise per Session: Not on file  Stress:   . Feeling of Stress : Not on file  Social Connections:   . Frequency of Communication with Friends and Family: Not on file  . Frequency of Social Gatherings with Friends and Family: Not on file  . Attends Religious Services: Not on file  . Active Member of Clubs or Organizations: Not on file  . Attends Banker Meetings: Not on file  . Marital Status: Not on file  Intimate Partner Violence:   . Fear of Current or Ex-Partner: Not on file  . Emotionally  Abused: Not on file  . Physically Abused: Not on file  . Sexually Abused: Not on file   Family History  Problem Relation Age of Onset  . Hypertension Other   . Liver cancer Daughter   . Heart disease Daughter   . Other Son        drowning  . COPD Son   . Breast cancer Neg Hx     OBJECTIVE:  Vitals:   01/27/19 1451  BP: (!) 157/95  Pulse: (!) 102  Resp: 17  Temp: 98.7 F (37.1 C)  TempSrc: Oral  SpO2: 97%    General appearance: ALERT; in no acute distress.  Head: NCAT ENT: PERRL, EOMI grossly; oropharynx clear Lungs: Normal respiratory effort; CTAB CV: RRR Musculoskeletal: LLE Inspection: Skin warm, dry, clear and intact without obvious erythema, effusion, or ecchymosis.  Palpation: diffusely TTP over posterior calf, and posterior knee ROM: FROM active and passive Skin: warm and dry Neurologic: Ambulates with antalgic gait Psychological: alert and cooperative; normal mood and affect   ASSESSMENT & PLAN:  1. Pain of left calf    Recommending further evaluation and management in the ED.  Cannot rule out blood clot in urgent care setting.  Patient family made aware.  Will go by private vehicle to ED.      Rennis Harding, PA-C 01/27/19 1614    Alvino Chapel [place], PA-C 01/27/19 1616

## 2019-02-03 ENCOUNTER — Other Ambulatory Visit: Payer: Self-pay

## 2019-02-03 ENCOUNTER — Encounter: Payer: Self-pay | Admitting: Family Medicine

## 2019-02-03 ENCOUNTER — Ambulatory Visit (INDEPENDENT_AMBULATORY_CARE_PROVIDER_SITE_OTHER): Payer: Medicare HMO | Admitting: Family Medicine

## 2019-02-03 DIAGNOSIS — R202 Paresthesia of skin: Secondary | ICD-10-CM

## 2019-02-03 DIAGNOSIS — R42 Dizziness and giddiness: Secondary | ICD-10-CM | POA: Diagnosis not present

## 2019-02-03 DIAGNOSIS — M25562 Pain in left knee: Secondary | ICD-10-CM

## 2019-02-03 DIAGNOSIS — I10 Essential (primary) hypertension: Secondary | ICD-10-CM

## 2019-02-03 DIAGNOSIS — H9203 Otalgia, bilateral: Secondary | ICD-10-CM

## 2019-02-03 DIAGNOSIS — R200 Anesthesia of skin: Secondary | ICD-10-CM | POA: Diagnosis not present

## 2019-02-03 MED ORDER — DICLOFENAC SODIUM 1 % EX GEL: 4.0000 g | Freq: Four times a day (QID) | CUTANEOUS | 6 refills | Status: DC | PRN

## 2019-02-03 NOTE — Progress Notes (Signed)
Office Visit Note   Patient: Valerie Davenport           Date of Birth: 08-Jan-1932           MRN: [id number] Visit Date: 02/03/2019 Requested by: Burton Apley, MD [address] [place],  [place] [address] PCP: Burton Apley, MD  Subjective: Chief Complaint  Patient presents with  . Left Knee - Pain    Pain in the posterior knee, swelling.    HPI: She is here with multiple concerns today.  Her left calf has been hurting for the past week or 2, no injury.  She went to the ER where they did Doppler studies which were negative for Baker's cyst or DVT.  No medications were given.  Pain with weightbearing, feels like a muscle cramp.  She has chronic low back pain but no radicular pain.  Left hand has been numb for quite a while now.  She is sleeping with a wrist brace on but it does not seem to be working.  Numbness is constant.  Her ears have been hurting and she has had dizziness.  She has not been able to wear her hearing aids since her PCP tried to look in her ears.  From a blood pressure standpoint her readings have been elevated at home.  Her PCP asked her to stop taking enalapril and amlodipine, but she feels like she should be back on them again.               ROS: No fevers or chills, no head congestion.  All other systems were reviewed and are negative.  Objective: Vital Signs: There were no vitals taken for this visit.  Physical Exam:  General:  Alert and oriented, in no acute distress. Pulm:  Breathing unlabored. Psy:  Normal mood, congruent affect.  HEENT: Ear canals are very narrow and I am unable to adequately visualize her tympanic membranes. Left hand: Positive Tinel's at the carpal tunnel, positive Phalen's test.  Slight thenar atrophy. Left leg: She is tender at the medial head gastrocnemius.  No palpable popliteal cyst, mild medial joint line tenderness with no joint effusion.  Imaging: None today  Assessment & Plan: 1.  Left calf pain, possibly  muscular strain -Home stretches given, Voltaren gel.  Could contemplate physical therapy or possibly intra-articular knee injection if symptoms persist.  2.  Hypertension -She will resume medication and monitor blood pressure readings at home. -She plans to sign a release to have records transferred to [place] for ongoing primary care.  3.  Bilateral ear pain with dizziness -Referral to ENT for further evaluation.  4.  Left hand numbness with possible severe carpal tunnel syndrome -Nerve studies, surgical consult if indicated.     Procedures: No procedures performed  No notes on file     PMFS History: Patient Active Problem List   Diagnosis Date Noted  . Essential hypertension 11/10/2018  . HNP (herniated nucleus pulposus), lumbar 02/20/2012    Class: Diagnosis of   Past Medical History:  Diagnosis Date  . Anxiety    but doesn't require meds  . Arthritis   . Chronic back pain    HNP-takes Tramadol and Pain Pill(at night)  . History of blood transfusion    no abnormal reaction noted  . History of migraine    last one about 76yrs ago  . History of shingles   . Hyperlipidemia    takes Simvastatin daily  . Hypertension    takes Enalapril and  Chlorthalidone daily  . Hypothyroidism    takes Levothyroxine daily  . Joint pain   . Nocturia     Family History  Problem Relation Age of Onset  . Hypertension Other   . Liver cancer Daughter   . Heart disease Daughter   . Other Son        drowning  . COPD Son   . Breast cancer Neg Hx     Past Surgical History:  Procedure Laterality Date  . ABDOMINAL HYSTERECTOMY    . BACK SURGERY    . bilateral cataract surgery    . COLONOSCOPY    . LUMBAR LAMINECTOMY  02/20/2012  . LUMBAR LAMINECTOMY  02/20/2012   Procedure: MICRODISCECTOMY LUMBAR LAMINECTOMY;  Surgeon: Marybelle Killings, MD;  Location: [facility];  Service: Orthopedics;  Laterality: N/A;  Right L4 hemilaminectomy, microdiscectomy, removal of free fragment   Social History    Occupational History  . Not on file  Tobacco Use  . Smoking status: Never Smoker  . Smokeless tobacco: Never Used  Substance and Sexual Activity  . Alcohol use: No  . Drug use: No  . Sexual activity: Not Currently    Birth control/protection: Surgical    Comment: hyst

## 2019-03-11 ENCOUNTER — Other Ambulatory Visit: Payer: Self-pay

## 2019-03-11 ENCOUNTER — Encounter (HOSPITAL_COMMUNITY): Payer: Self-pay

## 2019-03-11 ENCOUNTER — Emergency Department (HOSPITAL_COMMUNITY): Payer: Medicare HMO

## 2019-03-11 ENCOUNTER — Observation Stay (HOSPITAL_COMMUNITY)
Admission: EM | Admit: 2019-03-11 | Discharge: 2019-03-12 | Disposition: A | Discharge status: Home / Self Care | Payer: Medicare HMO | Attending: Internal Medicine | Admitting: Internal Medicine

## 2019-03-11 DIAGNOSIS — Z79899 Other long term (current) drug therapy: Secondary | ICD-10-CM | POA: Diagnosis not present

## 2019-03-11 DIAGNOSIS — I6782 Cerebral ischemia: Secondary | ICD-10-CM | POA: Insufficient documentation

## 2019-03-11 DIAGNOSIS — G459 Transient cerebral ischemic attack, unspecified: Secondary | ICD-10-CM | POA: Diagnosis not present

## 2019-03-11 DIAGNOSIS — R4781 Slurred speech: Secondary | ICD-10-CM | POA: Diagnosis present

## 2019-03-11 DIAGNOSIS — R2681 Unsteadiness on feet: Secondary | ICD-10-CM | POA: Diagnosis not present

## 2019-03-11 DIAGNOSIS — R519 Headache, unspecified: Secondary | ICD-10-CM | POA: Diagnosis not present

## 2019-03-11 DIAGNOSIS — R531 Weakness: Secondary | ICD-10-CM | POA: Insufficient documentation

## 2019-03-11 DIAGNOSIS — E039 Hypothyroidism, unspecified: Secondary | ICD-10-CM | POA: Diagnosis not present

## 2019-03-11 DIAGNOSIS — R471 Dysarthria and anarthria: Secondary | ICD-10-CM

## 2019-03-11 DIAGNOSIS — I10 Essential (primary) hypertension: Secondary | ICD-10-CM | POA: Insufficient documentation

## 2019-03-11 DIAGNOSIS — E785 Hyperlipidemia, unspecified: Secondary | ICD-10-CM | POA: Diagnosis not present

## 2019-03-11 DIAGNOSIS — Z20822 Contact with and (suspected) exposure to covid-19: Secondary | ICD-10-CM | POA: Insufficient documentation

## 2019-03-11 LAB — COMPREHENSIVE METABOLIC PANEL
ALT: 16 U/L (ref 0–44)
AST: 21 U/L (ref 15–41)
Albumin: 4 g/dL (ref 3.5–5.0)
Alkaline Phosphatase: 51 U/L (ref 38–126)
Anion gap: 12 (ref 5–15)
BUN: 30 mg/dL — ABNORMAL HIGH (ref 8–23)
CO2: 26 mmol/L (ref 22–32)
Calcium: 9.5 mg/dL (ref 8.9–10.3)
Chloride: 102 mmol/L (ref 98–111)
Creatinine, Ser: 0.91 mg/dL (ref 0.44–1.00)
GFR calc Af Amer: >60 mL/min (ref >60)
GFR calc non Af Amer: 58 mL/min — ABNORMAL LOW (ref >60)
Glucose, Bld: 137 mg/dL — ABNORMAL HIGH (ref 70–99)
Potassium: 3.7 mmol/L (ref 3.5–5.1)
Sodium: 140 mmol/L (ref 135–145)
Total Bilirubin: 0.5 mg/dL (ref 0.3–1.2)
Total Protein: 7.1 g/dL (ref 6.5–8.1)

## 2019-03-11 LAB — CBC
HCT: 45.7 % (ref 36.0–46.0)
Hemoglobin: 14.3 g/dL (ref 12.0–15.0)
MCH: 29.9 pg (ref 26.0–34.0)
MCHC: 31.3 g/dL (ref 30.0–36.0)
MCV: 95.4 fL (ref 80.0–100.0)
Platelets: 247 10*3/uL (ref 150–400)
RBC: 4.79 MIL/uL (ref 3.87–5.11)
RDW: 13.1 % (ref 11.5–15.5)
WBC: 8.3 10*3/uL (ref 4.0–10.5)
nRBC: 0 % (ref 0.0–0.2)

## 2019-03-11 LAB — DIFFERENTIAL
Abs Immature Granulocytes: 0.02 10*3/uL (ref 0.00–0.07)
Basophils Absolute: 0.1 10*3/uL (ref 0.0–0.1)
Basophils Relative: 1 %
Eosinophils Absolute: 0.2 10*3/uL (ref 0.0–0.5)
Eosinophils Relative: 2 %
Immature Granulocytes: 0 %
Lymphocytes Relative: 30 %
Lymphs Abs: 2.5 10*3/uL (ref 0.7–4.0)
Monocytes Absolute: 0.7 10*3/uL (ref 0.1–1.0)
Monocytes Relative: 9 %
Neutro Abs: 4.8 10*3/uL (ref 1.7–7.7)
Neutrophils Relative %: 58 %

## 2019-03-11 LAB — PROTIME-INR
INR: 1 (ref 0.8–1.2)
Prothrombin Time: 12.9 seconds (ref 11.4–15.2)

## 2019-03-11 LAB — APTT: aPTT: 26 seconds (ref 24–36)

## 2019-03-11 LAB — ETHANOL: Alcohol, Ethyl (B): <10 mg/dL (ref <10)

## 2019-03-11 MED ORDER — ALTEPLASE 100 MG IV SOLR
INTRAVENOUS | Status: AC
  Filled 2019-03-11: qty 100

## 2019-03-11 MED ORDER — ROSUVASTATIN CALCIUM 20 MG PO TABS
20.0000 mg | ORAL_TABLET | Freq: Every day | ORAL | Status: DC
  Administered 2019-03-12: 20 mg via ORAL
  Filled 2019-03-11 (×3): qty 1

## 2019-03-11 MED ORDER — ASPIRIN 81 MG PO CHEW
324.0000 mg | CHEWABLE_TABLET | Freq: Once | ORAL | Status: AC
  Administered 2019-03-11: 22:00:00 324 mg via ORAL
  Filled 2019-03-11: qty 4

## 2019-03-11 NOTE — ED Notes (Signed)
Pt son at bedside- [facility] performed while son was at bedside. This nurse asked son if her speech was slurred in his opinion, son says "maybe a little, but earlier it was way worse, I couldn't even make out what she was saying." Neurologist aware of this.

## 2019-03-11 NOTE — ED Notes (Signed)
Pt placed on bedpan- pt says "feels like she needs to have BM"

## 2019-03-11 NOTE — Consult Note (Signed)
[facility] TeleNeurology Consult Services   Date of Service:   03/11/2019 20:56:48  Impression:     .  I63.9 - Cerebrovascular accident (CVA), unspecified mechanism (HCC)  Comments/Sign-Out: her NIH stroke scale score is 1 for only some very mild dysarthria at this point so given the low score and nondisabling symptoms she was not given alteplase. Should benefit from starting on an aspirin and inpatient evaluation for causes of stroke  Metrics: Last Known Well: 03/11/2019 18:00:00 [facility] Notification Time: 03/11/2019 20:56:48 Arrival Time: 03/11/2019 20:36:00 Stamp Time: 03/11/2019 20:56:48 Time First Login Attempt: 03/11/2019 20:58:50 Symptoms: slurred speech NIHSS Start Assessment Time: 03/11/2019 21:05:08 Patient is not a candidate for Alteplase/Activase. Alteplase Medical Decision: 03/11/2019 21:07:17 Patient was not deemed candidate for Alteplase/Activase thrombolytics because of Resolved symptoms (no residual disabling symptoms).  CT head showed no acute hemorrhage or acute core infarct.  Clinical Presentation is not Suggestive of Large Vessel Occlusive Disease  ED Physician notified of diagnostic impression and management plan on 03/11/2019 21:12:14  Our recommendations are outlined below.  Recommendations:     .  Activate Stroke Protocol Admission/Order Set     .  Stroke/Telemetry Floor     .  Neuro Checks     .  Bedside Swallow Eval     .  DVT Prophylaxis     .  IV Fluids, Normal Saline     .  Head of Bed 30 Degrees     .  Euglycemia and Avoid Hyperthermia (PRN Acetaminophen)     .  Initiate Aspirin 81 MG Daily  Routine Consultation with Inhouse Neurology for Follow up Care  Sign Out:     .  Discussed with Emergency Department Provider    ------------------------------------------------------------------------------  History of Present Illness: Patient is a 86 year old Female.  Patient was brought by private transportation with symptoms of  slurred speech  this is an 86 year old female who was in her usual state of health today when she developed slurred speech. At this time she feels like she is doing well without complaint and she does feel much better. Her son is also available and he indicates that her speech has significantly improved compared to when it was at her worst.   Past Medical History:     . Hypertension     . Hyperlipidemia  Anticoagulant use:  No  Antiplatelet use: No    Examination: BP(173/88), Pulse(85), Blood Glucose(-) 1A: Level of Consciousness - Alert; keenly responsive + 0 1B: Ask Month and Age - Both Questions Right + 0 1C: Blink Eyes & Squeeze Hands - Performs Both Tasks + 0 2: Test Horizontal Extraocular Movements - Normal + 0 3: Test Visual Fields - No Visual Loss + 0 4: Test Facial Palsy (Use Grimace if Obtunded) - Normal symmetry + 0 5A: Test Left Arm Motor Drift - No Drift for 10 Seconds + 0 5B: Test Right Arm Motor Drift - No Drift for 10 Seconds + 0 6A: Test Left Leg Motor Drift - No Drift for 5 Seconds + 0 6B: Test Right Leg Motor Drift - No Drift for 5 Seconds + 0 7: Test Limb Ataxia (FNF/Heel-Shin) - No Ataxia + 0 8: Test Sensation - Normal; No sensory loss + 0 9: Test Language/Aphasia - Normal; No aphasia + 0 10: Test Dysarthria - Mild-Moderate Dysarthria: Slurring but can be understood + 1 11: Test Extinction/Inattention - No abnormality + 0  NIHSS Score: 1   Patient/Family was informed the Neurology Consult would occur  via TeleHealth consult by way of interactive audio and video telecommunications and consented to receiving care in this manner.   Due to the immediate potential for life-threatening deterioration due to underlying acute neurologic illness, I spent 20 minutes providing critical care. This time includes time for face to face visit via telemedicine, review of medical records, imaging studies and discussion of findings with providers, the patient and/or  family.   Dr Janean Sark   [facility] [phone]  Case [id number]

## 2019-03-11 NOTE — ED Notes (Signed)
Pt transported to CT with Haywood Lasso, RN and Glade Nurse, NT on monitor

## 2019-03-11 NOTE — ED Triage Notes (Signed)
Pt presents to ED with slurred speech and dizziness per son. Sx started around 6 pm. Pt able to move all extremities and walk from [facility] to bed. Pt has slurred speech with difficulty getting words out at this time. Dr Estell Harpin at bedside. Neuro cart at bedside- Philipp Deputy, RN getting information and pt history at time from this nurse.

## 2019-03-11 NOTE — ED Provider Notes (Signed)
[facility] EMERGENCY DEPARTMENT Provider Note   CSN: [id number] Arrival date & time: 03/11/19  2036  An emergency department physician performed an initial assessment on this suspected stroke patient at 2047.  History Chief Complaint  Patient presents with  . Code Stroke    Valerie Davenport is a 86 y.o. female.  At 6 PM tonight the patient started to have some slurred speech no other symptoms.  When she got to the emergency department her speech was improving some but still mildly slurred  The history is provided by a relative. No language interpreter was used.  Weakness Severity:  Mild Onset quality:  Sudden Timing:  Constant Progression:  Waxing and waning Chronicity:  New Context: not allergies   Relieved by:  Nothing Worsened by:  Nothing      Past Medical History:  Diagnosis Date  . Anxiety    but doesn't require meds  . Arthritis   . Chronic back pain    HNP-takes Tramadol and Pain Pill(at night)  . History of blood transfusion    no abnormal reaction noted  . History of migraine    last one about 60yrs ago  . History of shingles   . Hyperlipidemia    takes Simvastatin daily  . Hypertension    takes Enalapril and Chlorthalidone daily  . Hypothyroidism    takes Levothyroxine daily  . Joint pain   . Nocturia     Patient Active Problem List   Diagnosis Date Noted  . Essential hypertension 11/10/2018  . HNP (herniated nucleus pulposus), lumbar 02/20/2012    Class: Diagnosis of    Past Surgical History:  Procedure Laterality Date  . ABDOMINAL HYSTERECTOMY    . BACK SURGERY    . bilateral cataract surgery    . COLONOSCOPY    . LUMBAR LAMINECTOMY  02/20/2012  . LUMBAR LAMINECTOMY  02/20/2012   Procedure: MICRODISCECTOMY LUMBAR LAMINECTOMY;  Surgeon: Eldred Manges, MD;  Location: [facility] OR;  Service: Orthopedics;  Laterality: N/A;  Right L4 hemilaminectomy, microdiscectomy, removal of free fragment     OB History    Gravida  6   Para  5   Term  5   Preterm      AB  1   Living        SAB  1   TAB      Ectopic      Multiple      Live Births              Family History  Problem Relation Age of Onset  . Hypertension Other   . Liver cancer Daughter   . Heart disease Daughter   . Other Son        drowning  . COPD Son   . Breast cancer Neg Hx     Social History   Tobacco Use  . Smoking status: Never Smoker  . Smokeless tobacco: Never Used  Substance Use Topics  . Alcohol use: No  . Drug use: No    Home Medications Prior to Admission medications   Medication Sig Start Date End Date Taking? Authorizing Provider  ALPRAZolam (XANAX) 0.25 MG tablet Take 0.25 mg by mouth 2 (two) times daily. 12/03/18   [provider]  diclofenac Sodium (VOLTAREN) 1 % GEL Apply 4 g topically 4 (four) times daily as needed. 02/03/19   Hilts, Casimiro Needle, MD  enalapril (VASOTEC) 20 MG tablet Take 20 mg by mouth daily.    [provider]  HYDROcodone-acetaminophen (NORCO/VICODIN) 5-325 MG per tablet Take 1-2 tablets by mouth every 4 (four) hours as needed. 02/20/12   [name], PA-C  levothyroxine (SYNTHROID, LEVOTHROID) 50 MCG tablet Take 50 mcg by mouth daily.  10/27/13   [provider]  meclizine (ANTIVERT) 25 MG tablet Take 25 mg by mouth 2 (two) times daily as needed. 01/10/19   [provider]  simvastatin (ZOCOR) 20 MG tablet Take 20 mg by mouth every evening.    [provider]  amLODipine (NORVASC) 5 MG tablet Take 1 tablet (5 mg total) by mouth daily. Patient not taking: Reported on 11/10/2018 10/26/16 01/27/19  Glynn Octave, MD    Allergies    Patient has no known allergies.  Review of Systems   Review of Systems  Unable to perform ROS: Mental status change  Neurological: Positive for weakness.    Physical Exam Updated Vital Signs BP (!) 187/84   Pulse 80   Temp 98.2 F (36.8 C) (Oral)   Resp 16   Wt 43.2 kg   SpO2 99%   BMI 16.36 kg/m   Physical Exam Vitals  and nursing note reviewed.  Constitutional:      Appearance: She is well-developed.  HENT:     Head: Normocephalic.     Nose: Nose normal.  Eyes:     General: No scleral icterus.    Conjunctiva/sclera: Conjunctivae normal.  Neck:     Thyroid: No thyromegaly.  Cardiovascular:     Rate and Rhythm: Normal rate and regular rhythm.     Heart sounds: No murmur. No friction rub. No gallop.   Pulmonary:     Breath sounds: No stridor. No wheezing or rales.  Chest:     Chest wall: No tenderness.  Abdominal:     General: There is no distension.     Tenderness: There is no abdominal tenderness. There is no rebound.  Musculoskeletal:        General: Normal range of motion.     Cervical back: Neck supple.  Lymphadenopathy:     Cervical: No cervical adenopathy.  Skin:    Findings: No erythema or rash.  Neurological:     Mental Status: She is alert and oriented to person, place, and time.     Motor: No abnormal muscle tone.     Coordination: Coordination normal.     Comments: Mild slurred speech  Psychiatric:        Behavior: Behavior normal.     ED Results / Procedures / Treatments   Labs (all labs ordered are listed, but only abnormal results are displayed) Labs Reviewed  COMPREHENSIVE METABOLIC PANEL - Abnormal; Notable for the following components:      Result Value   Glucose, Bld 137 (*)    BUN 30 (*)    GFR calc non Af Amer 58 (*)    All other components within normal limits  RESPIRATORY PANEL BY RT PCR (FLU A&B, COVID)  PROTIME-INR  APTT  CBC  DIFFERENTIAL  ETHANOL  RAPID URINE DRUG SCREEN, HOSP PERFORMED  URINALYSIS, ROUTINE W REFLEX MICROSCOPIC  I-STAT CHEM 8, ED    EKG None  Radiology CT HEAD CODE STROKE WO CONTRAST  Result Date: 03/11/2019 CLINICAL DATA:  Code stroke. Initial evaluation for acute stroke, ataxia, confusion. EXAM: CT HEAD WITHOUT CONTRAST TECHNIQUE: Contiguous axial images were obtained from the base of the skull through the vertex without  intravenous contrast. COMPARISON:  Prior head CT from 10/26/2016. FINDINGS: Brain: Generalized age-related cerebral atrophy with  chronic microvascular ischemic disease. No acute intracranial hemorrhage. No acute large vessel territory infarct. No mass lesion, mass effect, or midline shift. No hydrocephalus. No extra-axial fluid collection. Vascular: No hyperdense vessel. Scattered vascular calcifications noted within the carotid siphons. Skull: Scalp soft tissues and calvarium within normal limits. Sinuses/Orbits: Globes and orbital soft tissues demonstrate no acute finding. Paranasal sinuses and mastoid air cells are clear. Other: None. ASPECTS [facility] Stroke Program Early CT Score) - Ganglionic level infarction (caudate, lentiform nuclei, internal capsule, insula, M1-M3 cortex): 7 - Supraganglionic infarction (M4-M6 cortex): 3 Total score (0-10 with 10 being normal): 10 IMPRESSION: 1. No acute intracranial infarct or other abnormality identified. 2. ASPECTS is 10. 3. Age-related cerebral atrophy with moderate chronic microvascular ischemic disease. Critical Value/emergent results were called by telephone at the time of interpretation on 03/11/2019 at 9:07 pm to provider Elva Breaker , who verbally acknowledged these results. Electronically Signed   By: Jeannine Boga M.D.   On: 03/11/2019 21:07    Procedures Procedures (including critical care time)  Medications Ordered in ED Medications  aspirin chewable tablet 324 mg (324 mg Oral Given 03/11/19 2154)    ED Course  I have reviewed the triage vital signs and the nursing notes.  Pertinent labs & imaging results that were available during my care of the patient were reviewed by me and considered in my medical decision making (see chart for details). CRITICAL CARE Performed by: Milton Ferguson Total critical care time: 45 minutes Critical care time was exclusive of separately billable procedures and treating other patients. Critical care was  necessary to treat or prevent imminent or life-threatening deterioration. Critical care was time spent personally by me on the following activities: development of treatment plan with patient and/or surrogate as well as nursing, discussions with consultants, evaluation of patient's response to treatment, examination of patient, obtaining history from patient or surrogate, ordering and performing treatments and interventions, ordering and review of laboratory studies, ordering and review of radiographic studies, pulse oximetry and re-evaluation of patient's condition.    MDM Rules/Calculators/A&P                     The patient was seen by neurology.  Since her symptoms were improving the neurologist did not want to give TPA.  Also the neurologist felt like this was not a large vessel stroke.  And recommended aspirin and admission.  I reevaluated the patient and according to her son her speech was back to normal.  She will be admitted to medicine for stroke work-up Final Clinical Impression(s) / ED Diagnoses Final diagnoses:  TIA (transient ischemic attack)    Rx / DC Orders ED Discharge Orders    None       Milton Ferguson, MD 03/11/19 2212

## 2019-03-11 NOTE — Progress Notes (Signed)
Called from ED @ 2037 Exam started @ 2040 Exam ended 2044 Rad called 2044  ED Dr Estell Harpin [phone]  Increased weakness and confusion @ 6pm.

## 2019-03-12 ENCOUNTER — Observation Stay (HOSPITAL_COMMUNITY): Payer: Medicare HMO

## 2019-03-12 ENCOUNTER — Other Ambulatory Visit (HOSPITAL_COMMUNITY): Payer: Self-pay | Admitting: Internal Medicine

## 2019-03-12 ENCOUNTER — Other Ambulatory Visit: Payer: Self-pay

## 2019-03-12 ENCOUNTER — Observation Stay (HOSPITAL_BASED_OUTPATIENT_CLINIC_OR_DEPARTMENT_OTHER): Payer: Medicare HMO

## 2019-03-12 DIAGNOSIS — Z79899 Other long term (current) drug therapy: Secondary | ICD-10-CM | POA: Diagnosis not present

## 2019-03-12 DIAGNOSIS — I361 Nonrheumatic tricuspid (valve) insufficiency: Secondary | ICD-10-CM

## 2019-03-12 DIAGNOSIS — R4781 Slurred speech: Secondary | ICD-10-CM | POA: Diagnosis present

## 2019-03-12 DIAGNOSIS — R531 Weakness: Secondary | ICD-10-CM | POA: Diagnosis not present

## 2019-03-12 DIAGNOSIS — I6782 Cerebral ischemia: Secondary | ICD-10-CM | POA: Diagnosis not present

## 2019-03-12 DIAGNOSIS — E039 Hypothyroidism, unspecified: Secondary | ICD-10-CM | POA: Diagnosis not present

## 2019-03-12 DIAGNOSIS — R2681 Unsteadiness on feet: Secondary | ICD-10-CM | POA: Diagnosis not present

## 2019-03-12 DIAGNOSIS — R519 Headache, unspecified: Secondary | ICD-10-CM | POA: Diagnosis present

## 2019-03-12 DIAGNOSIS — Z20822 Contact with and (suspected) exposure to covid-19: Secondary | ICD-10-CM | POA: Diagnosis not present

## 2019-03-12 DIAGNOSIS — I10 Essential (primary) hypertension: Secondary | ICD-10-CM | POA: Diagnosis not present

## 2019-03-12 DIAGNOSIS — G459 Transient cerebral ischemic attack, unspecified: Secondary | ICD-10-CM | POA: Diagnosis not present

## 2019-03-12 DIAGNOSIS — I340 Nonrheumatic mitral (valve) insufficiency: Secondary | ICD-10-CM | POA: Diagnosis not present

## 2019-03-12 DIAGNOSIS — E785 Hyperlipidemia, unspecified: Secondary | ICD-10-CM | POA: Diagnosis not present

## 2019-03-12 LAB — RAPID URINE DRUG SCREEN, HOSP PERFORMED
Amphetamines: NOT DETECTED
Barbiturates: NOT DETECTED
Benzodiazepines: NOT DETECTED
Cocaine: NOT DETECTED
Opiates: NOT DETECTED
Tetrahydrocannabinol: NOT DETECTED

## 2019-03-12 LAB — URINALYSIS, ROUTINE W REFLEX MICROSCOPIC
Bilirubin Urine: NEGATIVE
Glucose, UA: NEGATIVE mg/dL
Hgb urine dipstick: NEGATIVE
Ketones, ur: NEGATIVE mg/dL
Nitrite: NEGATIVE
Protein, ur: NEGATIVE mg/dL
Specific Gravity, Urine: 1.005 (ref 1.005–1.030)
WBC, UA: >50 WBC/hpf — ABNORMAL HIGH (ref 0–5)
pH: 7 (ref 5.0–8.0)

## 2019-03-12 LAB — HEMOGLOBIN A1C
Hgb A1c MFr Bld: 5.6 % (ref 4.8–5.6)
Mean Plasma Glucose: 114.02 mg/dL

## 2019-03-12 LAB — LIPID PANEL
Cholesterol: 159 mg/dL (ref 0–200)
HDL: 60 mg/dL (ref >40)
LDL Cholesterol: 90 mg/dL (ref 0–99)
Total CHOL/HDL Ratio: 2.7 RATIO
Triglycerides: 44 mg/dL (ref <150)
VLDL: 9 mg/dL (ref 0–40)

## 2019-03-12 LAB — RESPIRATORY PANEL BY RT PCR (FLU A&B, COVID)
Influenza A by PCR: NEGATIVE
Influenza B by PCR: NEGATIVE
SARS Coronavirus 2 by RT PCR: NEGATIVE

## 2019-03-12 LAB — ECHOCARDIOGRAM COMPLETE: Weight: 1524.8 oz

## 2019-03-12 MED ORDER — ENOXAPARIN SODIUM 30 MG/0.3ML ~~LOC~~ SOLN
30.0000 mg | SUBCUTANEOUS | Status: DC
  Administered 2019-03-12: 11:00:00 30 mg via SUBCUTANEOUS
  Filled 2019-03-12: qty 0.3

## 2019-03-12 MED ORDER — STROKE: EARLY STAGES OF RECOVERY BOOK
Status: AC
  Filled 2019-03-12: qty 1

## 2019-03-12 MED ORDER — ACETAMINOPHEN 650 MG RE SUPP: 650.0000 mg | RECTAL | Status: DC | PRN

## 2019-03-12 MED ORDER — ENOXAPARIN SODIUM 40 MG/0.4ML ~~LOC~~ SOLN: 40.0000 mg | SUBCUTANEOUS | Status: DC

## 2019-03-12 MED ORDER — LEVOTHYROXINE SODIUM 50 MCG PO TABS
50.0000 ug | ORAL_TABLET | Freq: Every day | ORAL | Status: DC
  Administered 2019-03-12: 11:00:00 50 ug via ORAL
  Filled 2019-03-12: qty 1

## 2019-03-12 MED ORDER — SODIUM CHLORIDE 0.9 % IV SOLN: INTRAVENOUS | Status: DC

## 2019-03-12 MED ORDER — ACETAMINOPHEN 160 MG/5ML PO SOLN: 650.0000 mg | ORAL | Status: DC | PRN

## 2019-03-12 MED ORDER — ASPIRIN EC 81 MG PO TBEC: 81.0000 mg | DELAYED_RELEASE_TABLET | Freq: Every day | ORAL | Status: DC

## 2019-03-12 MED ORDER — ACETAMINOPHEN 325 MG PO TABS
650.0000 mg | ORAL_TABLET | ORAL | Status: DC | PRN
  Administered 2019-03-12: 11:00:00 650 mg via ORAL
  Filled 2019-03-12: qty 2

## 2019-03-12 MED ORDER — ACETAMINOPHEN 500 MG PO TABS
1000.0000 mg | ORAL_TABLET | Freq: Once | ORAL | Status: AC
  Administered 2019-03-12: 1000 mg via ORAL
  Filled 2019-03-12: qty 2

## 2019-03-12 MED ORDER — STROKE: EARLY STAGES OF RECOVERY BOOK
Freq: Once | Status: DC
  Filled 2019-03-12: qty 1

## 2019-03-12 MED ORDER — ASPIRIN EC 325 MG PO TBEC
325.0000 mg | DELAYED_RELEASE_TABLET | Freq: Every day | ORAL | Status: DC
  Administered 2019-03-12: 11:00:00 325 mg via ORAL
  Filled 2019-03-12: qty 1

## 2019-03-12 MED ORDER — ASPIRIN EC 81 MG PO TBEC: 81.0000 mg | DELAYED_RELEASE_TABLET | Freq: Every day | ORAL | 11 refills | Status: AC

## 2019-03-12 NOTE — Progress Notes (Signed)
Pt admitted to 3W34, alert and oriented X 4, walks with 1 assist with front wheel walker to bedside commode. NIH score 1. Pt c/o headache, RN administered Tylenol.

## 2019-03-12 NOTE — Progress Notes (Signed)
Per HPI: Valerie Davenport is a 86 y.o. female with medical history significant of hypertension, hyperlipidemia, hypothyroidism and anxiety presented to ED for evaluation of dysarthria.  Patient's son is also present at the bedside.  Patient's son stated that patient suddenly started having slurred speech with no weakness or numbness in any other part of her body and she is brought to ED for further evaluation.  Patient is awake, alert and oriented to place and person but not time.  Patient admits of having mild frontal headache at this time but denies  fever, chills, dizziness, blurriness of vision, chest pain, palpitations, weakness or numbness in any part of her body and any recent head injury or fall.  According to the son patient is at her baseline and her speech is improved.  Patient has no history of stroke or TIA in the past.  Patient is able to perform her ADLs and ADLs independent at baseline.  2/13: Patient seen and evaluated in the emergency department.  She complains of a mild headache and is very hard of hearing.  CT of her head was negative for acute intracranial bleed or other pathology.  She is going to transfer to [facility] for further evaluation with brain MRI and possible neurology consultation.  She claims that her dysarthria has now improved.  2D echocardiogram as well as ultrasounds pending.  LDL noted to be 90 and patient is on statin.  Hemoglobin A1c pending.  Start gentle normal saline, time-limited until further swallow evaluation.  Monitor blood pressures closely.  Daily aspirin initiated.  Total care time: 25 minutes.

## 2019-03-12 NOTE — Progress Notes (Signed)
  Echocardiogram 2D Echocardiogram has been performed.  Valerie Davenport 03/12/2019, 12:37 PM

## 2019-03-12 NOTE — ED Notes (Signed)
Pt placed on bedpan- attempting to have BM

## 2019-03-12 NOTE — H&P (Signed)
History and Physical    Valerie Davenport GXQ:[id number] DOB: [birth year] DOA: 03/11/2019  PCP: Burton Apley, MD (Confirm with patient/family/NH records and if not entered, this has to be entered at [facility] point of entry) Patient coming from: Home  I have personally briefly reviewed patient's old medical records in [facility]  Chief Complaint: Dysarthria  HPI: Valerie Davenport is a 86 y.o. female with medical history significant of hypertension, hyperlipidemia, hypothyroidism and anxiety presented to ED for evaluation of dysarthria.  Patient's son is also present at the bedside.  Patient's son stated that patient suddenly started having slurred speech with no weakness or numbness in any other part of her body and she is brought to ED for further evaluation.  Patient is awake, alert and oriented to place and person but not time.  Patient admits of having mild frontal headache at this time but denies  fever, chills, dizziness, blurriness of vision, chest pain, palpitations, weakness or numbness in any part of her body and any recent head injury or fall.  According to the son patient is at her baseline and her speech is improved.  Patient has no history of stroke or TIA in the past.  Patient is able to perform her ADLs and ADLs independent at baseline.  ED Course: On arrival to the ED, patient was still having slurred speech but the severity of symptoms had improved according to patient's son.  Patient had a blood pressure of 180/97, heart rate 88, respiratory rate 17 and oxygen saturation 98% on room air.  CBC and BMP within normal limits.  CT head was negative for acute intracranial bleed or pathology.  ED physician contacted neurologist and he recommended not to give TPA because the symptoms are improving and recommended admission to the hospital for stroke/TIA work-up.  Patient was given 324 mg of aspirin in the ED.  Review of Systems: As per HPI otherwise 10 point review of systems negative.     Past Medical History:  Diagnosis Date  . Anxiety    but doesn't require meds  . Arthritis   . Chronic back pain    HNP-takes Tramadol and Pain Pill(at night)  . History of blood transfusion    no abnormal reaction noted  . History of migraine    last one about 22yrs ago  . History of shingles   . Hyperlipidemia    takes Simvastatin daily  . Hypertension    takes Enalapril and Chlorthalidone daily  . Hypothyroidism    takes Levothyroxine daily  . Joint pain   . Nocturia     Past Surgical History:  Procedure Laterality Date  . ABDOMINAL HYSTERECTOMY    . BACK SURGERY    . bilateral cataract surgery    . COLONOSCOPY    . LUMBAR LAMINECTOMY  02/20/2012  . LUMBAR LAMINECTOMY  02/20/2012   Procedure: MICRODISCECTOMY LUMBAR LAMINECTOMY;  Surgeon: Eldred Manges, MD;  Location: [facility] OR;  Service: Orthopedics;  Laterality: N/A;  Right L4 hemilaminectomy, microdiscectomy, removal of free fragment     reports that she has never smoked. She has never used smokeless tobacco. She reports that she does not drink alcohol or use drugs.  No Known Allergies  Family History  Problem Relation Age of Onset  . Hypertension Other   . Liver cancer Daughter   . Heart disease Daughter   . Other Son        drowning  . COPD Son   . Breast cancer Neg Hx  Prior to Admission medications   Medication Sig Start Date End Date Taking? Authorizing Provider  ALPRAZolam (XANAX) 0.25 MG tablet Take 0.25 mg by mouth 2 (two) times daily. 12/03/18  Yes [provider]  diclofenac Sodium (VOLTAREN) 1 % GEL Apply 4 g topically 4 (four) times daily as needed. 02/03/19  Yes Hilts, Legrand Como, MD  enalapril (VASOTEC) 20 MG tablet Take 20 mg by mouth daily.   Yes [provider]  levothyroxine (SYNTHROID, LEVOTHROID) 50 MCG tablet Take 50 mcg by mouth daily.  10/27/13  Yes [provider]  meclizine (ANTIVERT) 25 MG tablet Take 25 mg by mouth 2 (two) times daily as needed. 01/10/19  Yes  [provider]  simvastatin (ZOCOR) 20 MG tablet Take 20 mg by mouth every evening.   Yes [provider]  HYDROcodone-acetaminophen (NORCO/VICODIN) 5-325 MG per tablet Take 1-2 tablets by mouth every 4 (four) hours as needed. 02/20/12   Phillips Hay, PA-C  amLODipine (NORVASC) 5 MG tablet Take 1 tablet (5 mg total) by mouth daily. Patient not taking: Reported on 11/10/2018 10/26/16 01/27/19  Ezequiel Essex, MD    Physical Exam: Vitals:   03/11/19 2235 03/11/19 2300 03/12/19 0000 03/12/19 0030  BP:  (!) 168/98 (!) 161/125 (!) 132/119  Pulse: 77 77 89 86  Resp: 13 20 (!) 21 16  Temp:      TempSrc:      SpO2: 97% 96% 96% 96%  Weight:        Constitutional: NAD, calm, comfortable Vitals:   03/11/19 2235 03/11/19 2300 03/12/19 0000 03/12/19 0030  BP:  (!) 168/98 (!) 161/125 (!) 132/119  Pulse: 77 77 89 86  Resp: 13 20 (!) 21 16  Temp:      TempSrc:      SpO2: 97% 96% 96% 96%  Weight:       General: Patient is awake alert and oriented and not in any acute distress. Eyes: PERRL, lids and conjunctivae normal ENMT: Mucous membranes are moist. Posterior pharynx clear of any exudate or lesions.Normal dentition.  Neck: normal, supple, no masses, no thyromegaly Respiratory: clear to auscultation bilaterally, no wheezing, no crackles. Normal respiratory effort. No accessory muscle use.  Cardiovascular: Regular rate and rhythm, no murmurs / rubs / gallops. No extremity edema. 2+ pedal pulses. No carotid bruits.  Abdomen: no tenderness, no masses palpated. No hepatosplenomegaly. Bowel sounds positive.  Musculoskeletal: no clubbing / cyanosis. No joint deformity upper and lower extremities. Good ROM, no contractures. Normal muscle tone.  Skin: no rashes, lesions, ulcers. No induration Neurologic: Patient is awake, alert and oriented to place and person but not to time.  Speech is normal.  CN 2-12 grossly intact. Sensation intact, DTR normal. Strength 5/5 in all 4.     Labs on Admission: I have personally reviewed following labs and imaging studies  CBC: Recent Labs  Lab 03/11/19 2057  WBC 8.3  NEUTROABS 4.8  HGB 14.3  HCT 45.7  MCV 95.4  PLT 062   Basic Metabolic Panel: Recent Labs  Lab 03/11/19 2057  NA 140  K 3.7  CL 102  CO2 26  GLUCOSE 137*  BUN 30*  CREATININE 0.91  CALCIUM 9.5   GFR: Estimated Creatinine Clearance: 30.8 mL/min (by C-G formula based on SCr of 0.91 mg/dL). Liver Function Tests: Recent Labs  Lab 03/11/19 2057  AST 21  ALT 16  ALKPHOS 51  BILITOT 0.5  PROT 7.1  ALBUMIN 4.0   No results for input(s): LIPASE, AMYLASE in  the last 168 hours. No results for input(s): AMMONIA in the last 168 hours. Coagulation Profile: Recent Labs  Lab 03/11/19 2057  INR 1.0   Cardiac Enzymes: No results for input(s): CKTOTAL, CKMB, CKMBINDEX, TROPONINI in the last 168 hours. BNP (last 3 results) No results for input(s): PROBNP in the last 8760 hours. HbA1C: No results for input(s): HGBA1C in the last 72 hours. CBG: No results for input(s): GLUCAP in the last 168 hours. Lipid Profile: No results for input(s): CHOL, HDL, LDLCALC, TRIG, CHOLHDL, LDLDIRECT in the last 72 hours. Thyroid Function Tests: No results for input(s): TSH, T4TOTAL, FREET4, T3FREE, THYROIDAB in the last 72 hours. Anemia Panel: No results for input(s): VITAMINB12, FOLATE, FERRITIN, TIBC, IRON, RETICCTPCT in the last 72 hours. Urine analysis:    Component Value Date/Time   COLORURINE STRAW (A) 03/11/2019 2355   APPEARANCEUR HAZY (A) 03/11/2019 2355   LABSPEC 1.005 03/11/2019 2355   PHURINE 7.0 03/11/2019 2355   GLUCOSEU NEGATIVE 03/11/2019 2355   HGBUR NEGATIVE 03/11/2019 2355   BILIRUBINUR NEGATIVE 03/11/2019 2355   KETONESUR NEGATIVE 03/11/2019 2355   PROTEINUR NEGATIVE 03/11/2019 2355   UROBILINOGEN 1.0 02/18/2012 1307   NITRITE NEGATIVE 03/11/2019 2355   LEUKOCYTESUR LARGE (A) 03/11/2019 2355    Radiological Exams on Admission: CT  HEAD CODE STROKE WO CONTRAST  Result Date: 03/11/2019 CLINICAL DATA:  Code stroke. Initial evaluation for acute stroke, ataxia, confusion. EXAM: CT HEAD WITHOUT CONTRAST TECHNIQUE: Contiguous axial images were obtained from the base of the skull through the vertex without intravenous contrast. COMPARISON:  Prior head CT from 10/26/2016. FINDINGS: Brain: Generalized age-related cerebral atrophy with chronic microvascular ischemic disease. No acute intracranial hemorrhage. No acute large vessel territory infarct. No mass lesion, mass effect, or midline shift. No hydrocephalus. No extra-axial fluid collection. Vascular: No hyperdense vessel. Scattered vascular calcifications noted within the carotid siphons. Skull: Scalp soft tissues and calvarium within normal limits. Sinuses/Orbits: Globes and orbital soft tissues demonstrate no acute finding. Paranasal sinuses and mastoid air cells are clear. Other: None. ASPECTS [facility] Stroke Program Early CT Score) - Ganglionic level infarction (caudate, lentiform nuclei, internal capsule, insula, M1-M3 cortex): 7 - Supraganglionic infarction (M4-M6 cortex): 3 Total score (0-10 with 10 being normal): 10 IMPRESSION: 1. No acute intracranial infarct or other abnormality identified. 2. ASPECTS is 10. 3. Age-related cerebral atrophy with moderate chronic microvascular ischemic disease. Critical Value/emergent results were called by telephone at the time of interpretation on 03/11/2019 at 9:07 pm to provider JOSEPH ZAMMIT , who verbally acknowledged these results. Electronically Signed   By: Rise Mu M.D.   On: 03/11/2019 21:07    EKG: Independently reviewed.   Assessment/Plan Principal Problem:    TIA (transient ischemic attack) Patient presented with dysarthria that is completely resolved at this time. CT head was negative for acute intracranial bleed or intracranial pathology. Neurology was consulted by the ED physician who recommended to admit the patient  for stroke/TIA work-up. MRI brain ordered Carotid bilateral duplex ultrasound ordered. Echocardiogram with bubble study ordered. As the MRI is not available in [facility] on weekends, patient will be transferred to [facility] for stroke work-up.  Active Problems:   Essential hypertension Blood pressure is mildly elevated and the goal of blood pressure is less than 220.    Headache Tylenol as needed for headache.   DVT prophylaxis: Lovenox Code Status: Full code Family Communication: Patient's son is present at the bedside and the plan is explained to him in detail. Disposition Plan:  Patient will be discharged to home after stroke/TIA work-up if all the work-up is within normal limits. Consults called: Neurology consult be ordered if needed Admission status: Observation/telemetry   Thalia Party MD [facility] Pager [phone]  If 7PM-7AM, please contact night-coverage [email or web address] Password   03/12/2019, 12:51 AM

## 2019-03-12 NOTE — Progress Notes (Signed)
OT Cancellation Note  Patient Details Name: KATALAYA BEEL MRN: [id number] DOB: 1931/06/07   Cancelled Treatment:    Reason Eval/Treat Not Completed: Other (comment), pt just finishing up with imaging, eating lunch and RN reports pt about to be taken for MRI. Will follow up for OT eval as able.  Marcy Siren, OT [facility] Pager [phone] Office [phone]   Orlando Penner 03/12/2019, 11:56 AM

## 2019-03-12 NOTE — ED Notes (Signed)
Report given to 3W nurse at [facility]

## 2019-03-12 NOTE — Discharge Summary (Signed)
Physician Discharge Summary  Patient ID: Valerie Davenport MRN: [id number] DOB/AGE: 84-Feb-1933 84 y.o.  Admit date: 03/11/2019 Discharge date: 03/12/2019  Admission Diagnoses:  Discharge Diagnoses:  Principal Problem:   TIA (transient ischemic attack) Active Problems:   Essential hypertension   Headache   Discharged Condition: stable  Hospital Course:  Valerie A Hicksis a 86 y.o.femalewith medical history significant ofhypertension, hyperlipidemia, hypothyroidism and anxiety presented to ED for evaluation of dysarthria. Patient's son is also present at the bedside. Patient's son stated that patient suddenly started having slurred speech with no weakness or numbness in any other part of her body and she is brought to ED for further evaluation. Patient is awake, alert and oriented to place and person but not time. Patient admits of having mild frontal headache at this time but denies fever, chills, dizziness, blurriness of vision, chest pain, palpitations, weakness or numbness in any part of her body and any recent head injury or fall. According to the son patient is at her baseline and her speech is improved. Patient has no history of stroke or TIA in the past. Patient is able to perform her ADLs and ADLs independent at baseline. 2/13: Patient seen and evaluated in the emergency department.  She complains of a mild headache and is very hard of hearing.  CT of her head was negative for acute intracranial bleed or other pathology.  She is going to transfer to [facility] for further evaluation with brain MRI and possible neurology consultation.  She claims that her dysarthria has now improved. Patient was transferred from [facility] due to lack of MRI. She is status post 2D echocardiogram on 03/12/2019 showing ejection fraction of 60 to 65%, no regional wall motion abnormalities. MRI of the brain showed no acute intracranial abnormality but with moderate small vessel chronic disease. Patient  is hard to hearing and has no neurologic deficit.  She was evaluated by speech and swallow and cleared to resume diet.  Patient had wants without any issues.  She is clinically stable for discharge. She is being discharged home on aspirin and statin and to follow-up with her primary care physician in 1 week.  Consults: None  Significant Diagnostic Studies: MRI of the brain showing no acute intracranial abnormality and moderate small vessel chronic disease  Treatments: Aspirin and statin  Discharge Exam: Blood pressure (!) 156/98, pulse 72, temperature 98.7 F (37.1 C), resp. rate 15, weight 43.2 kg, SpO2 97 %. General appearance: alert, cooperative and no distress Head: Normocephalic, without obvious abnormality, atraumatic Nose: Nares normal. Septum midline. Mucosa normal. No drainage or sinus tenderness. Neck: no adenopathy, no carotid bruit, no JVD, supple, symmetrical, trachea midline and thyroid not enlarged, symmetric, no tenderness/mass/nodules Resp: clear to auscultation bilaterally Cardio: regular rate and rhythm, S1, S2 normal, no murmur, click, rub or gallop GI: soft, non-tender; bowel sounds normal; no masses,  no organomegaly Extremities: extremities normal, atraumatic, no cyanosis or edema Skin: Skin color, texture, turgor normal. No rashes or lesions Neurologic: Alert and oriented X 3, normal strength and tone. Normal symmetric reflexes. Normal coordination and gait  Disposition: Discharge disposition: 01-Home or Self Care       Discharge Instructions    Diet - low sodium heart healthy   Complete by: As directed    Discharge instructions   Complete by: As directed    Your MRI showed no acute intracranial abnormality except for small vessel chronic disease. Continue to take aspirin 81 mg daily and rosuvastatin as prescribed.  If symptoms return, go to the ED. Follow-up with your primary care physician in 1 week.   Increase activity slowly   Complete by: As  directed      Allergies as of 03/12/2019   No Known Allergies     Medication List    TAKE these medications   ALPRAZolam 0.25 MG tablet Commonly known as: XANAX Take 0.25 mg by mouth 2 (two) times daily.   aspirin EC 81 MG tablet Take 1 tablet (81 mg total) by mouth daily.   diclofenac Sodium 1 % Gel Commonly known as: Voltaren Apply 4 g topically 4 (four) times daily as needed.   enalapril 20 MG tablet Commonly known as: VASOTEC Take 20 mg by mouth daily.   HYDROcodone-acetaminophen 5-325 MG tablet Commonly known as: NORCO/VICODIN Take 1-2 tablets by mouth every 4 (four) hours as needed.   levothyroxine 50 MCG tablet Commonly known as: SYNTHROID Take 50 mcg by mouth daily.   meclizine 25 MG tablet Commonly known as: ANTIVERT Take 25 mg by mouth 2 (two) times daily as needed.   simvastatin 20 MG tablet Commonly known as: ZOCOR Take 20 mg by mouth every evening.      Total time spent on this discharge encounter is 32 minutes  Signed: Aquilla Hacker 03/12/2019, 3:35 PM

## 2019-03-12 NOTE — Evaluation (Signed)
Physical Therapy Evaluation Patient Details Name: Valerie Davenport MRN: [id number] DOB: 01-10-1932 Today's Date: 03/12/2019   History of Present Illness  Patient presented to the hospital following reports from son that she was having trouble speaking. Per son the symptoms have improved significantly. her MRI is negative for actue abnormality. PMH: shingle, htn, anxiety, low back pain, nocturia, migranes, joint pain   Clinical Impression  At baseline the patients son reports she was able to ambulate safely without an AD.Marland Kitchen He reports she has a cane and walker but dosen't use it. Today she required min guard without a walker and supervision without the walker. Once she was standing she was steady ambulating. Therapy advised patients son it may be safest to use the walker at this time. Acute therapy will continue to follow patient. She may benefit from home health to return to baseline mobility.   Follow Up Recommendations Home health PT    Equipment Recommendations  None recommended by PT    Recommendations for Other Services       Precautions / Restrictions Precautions Precautions: None Restrictions Weight Bearing Restrictions: No      Mobility  Bed Mobility Overal bed mobility: Independent             General bed mobility comments: sat up at the edge of the bed without assist. Able to get back in without assist   Transfers Overall transfer level: Needs assistance Equipment used: Rolling walker (2 wheeled) Transfers: Sit to/from Stand Sit to Stand: Min guard;Supervision         General transfer comment: Patient attempted to stand without walker and had balance isssues. She had to try two times to stand up. She required min gaurd. With the walker she shoed improved balance   Ambulation/Gait Ambulation/Gait assistance: Supervision Gait Distance (Feet): 120 Feet Assistive device: Rolling walker (2 wheeled) Gait Pattern/deviations: Step-through pattern;Decreased stride  length Gait velocity: decreased  Gait velocity interpretation: <1.31 ft/sec, indicative of household ambulator General Gait Details: once standing patient steady. She walked with a lsow but steady gait pattern   Stairs            Wheelchair Mobility    Modified Rankin (Stroke Patients Only)       Balance                                             Pertinent Vitals/Pain Pain Assessment: No/denies pain    Home Living Family/patient expects to be discharged to:: Private residence Living Arrangements: Children Available Help at Discharge: Family Type of Home: House Home Access: Level entry       Home Equipment: Environmental consultant - 2 wheels;Cane - single point Additional Comments: lives with son who assists her     Prior Function Level of Independence: Independent with assistive device(s)         Comments: did not always use AD but has one. Son assists her but reports for the most part she can do her ADL's      Hand Dominance        Extremity/Trunk Assessment   Upper Extremity Assessment Upper Extremity Assessment: Defer to OT evaluation    Lower Extremity Assessment Lower Extremity Assessment: Overall WFL for tasks assessed       Communication   Communication: HOH  Cognition Arousal/Alertness: Awake/alert Behavior During Therapy: [facility] for tasks assessed/performed  General Comments: Patient very hard of hearing. Therapy had to ask questions several times       General Comments General comments (skin integrity, edema, etc.): hearing made commands difficult for finger to noes testing and peripheral vision and coordiantion testing     Exercises     Assessment/Plan    PT Assessment Patient needs continued PT services  PT Problem List Decreased strength;Decreased activity tolerance;Decreased mobility;Decreased balance;Decreased knowledge of use of DME       PT Treatment Interventions DME  instruction;Gait training;Functional mobility training;Therapeutic activities;Therapeutic exercise;Neuromuscular re-education;Patient/family education    PT Goals (Current goals can be found in the Care Plan section)  Acute Rehab PT Goals Patient Stated Goal: to go home  PT Goal Formulation: With patient/family Time For Goal Achievement: 03/19/19 Potential to Achieve Goals: Good    Frequency Min 3X/week   Barriers to discharge        Co-evaluation               AM-PAC PT "6 Clicks" Mobility  Outcome Measure Help needed turning from your back to your side while in a flat bed without using bedrails?: None Help needed moving from lying on your back to sitting on the side of a flat bed without using bedrails?: None Help needed moving to and from a bed to a chair (including a wheelchair)?: A Little Help needed standing up from a chair using your arms (e.g., wheelchair or bedside chair)?: A Little Help needed to walk in hospital room?: A Little Help needed climbing 3-5 steps with a railing? : A Little 6 Click Score: 20    End of Session Equipment Utilized During Treatment: Gait belt Activity Tolerance: Patient tolerated treatment well Patient left: in bed;with call bell/phone within reach;with family/visitor present Nurse Communication: Mobility status PT Visit Diagnosis: Unsteadiness on feet (R26.81);Other abnormalities of gait and mobility (R26.89)    Time: 1350-1410 PT Time Calculation (min) (ACUTE ONLY): 20 min   Charges:   PT Evaluation $PT Eval Moderate Complexity: 1 Mod           Carney Living PT DPT  03/12/2019, 2:44 PM

## 2019-03-12 NOTE — ED Notes (Signed)
Pt taken off bedpan- lights dimmed as pt is attempting to go to sleep

## 2019-03-25 ENCOUNTER — Other Ambulatory Visit: Payer: Self-pay | Admitting: Internal Medicine

## 2019-03-25 DIAGNOSIS — Z1231 Encounter for screening mammogram for malignant neoplasm of breast: Secondary | ICD-10-CM

## 2019-04-12 ENCOUNTER — Ambulatory Visit: Payer: Medicare HMO | Admitting: Obstetrics & Gynecology

## 2019-04-19 ENCOUNTER — Ambulatory Visit (INDEPENDENT_AMBULATORY_CARE_PROVIDER_SITE_OTHER): Payer: Medicare HMO | Admitting: Obstetrics & Gynecology

## 2019-04-19 ENCOUNTER — Other Ambulatory Visit: Payer: Self-pay

## 2019-04-19 ENCOUNTER — Encounter: Payer: Self-pay | Admitting: Obstetrics & Gynecology

## 2019-04-19 VITALS — BP 150/96 | HR 92 | Ht 64.0 in | Wt 131.0 lb

## 2019-04-19 DIAGNOSIS — Z4689 Encounter for fitting and adjustment of other specified devices: Secondary | ICD-10-CM

## 2019-04-19 DIAGNOSIS — N819 Female genital prolapse, unspecified: Secondary | ICD-10-CM | POA: Diagnosis not present

## 2019-04-19 NOTE — Progress Notes (Signed)
Chief Complaint  Patient presents with  . Pessary Check    Blood pressure (!) 150/96, pulse 92, height 5\' 4"  (1.626 m), weight 131 lb (59.4 kg).  presents today for routine follow up related to her pessary.   She uses a Milex ring with support #5 She reports no vaginal discharge or vaginal bleeding.  Exam reveals no undue vaginal mucosal pressure of breakdown, no discharge and no vaginal bleeding.  The pessary is removed, cleaned and replaced without difficulty.    Valerie Davenport will be sen back in 4 months for continued follow up.  [name], MD  04/19/2019 3:24 PM

## 2019-05-09 ENCOUNTER — Other Ambulatory Visit: Payer: Self-pay

## 2019-05-09 ENCOUNTER — Ambulatory Visit
Admission: RE | Admit: 2019-05-09 | Discharge: 2019-05-09 | Disposition: A | Discharge status: Home / Self Care | Payer: Medicare HMO | Source: Ambulatory Visit | Attending: Internal Medicine | Admitting: Internal Medicine

## 2019-05-09 DIAGNOSIS — Z1231 Encounter for screening mammogram for malignant neoplasm of breast: Secondary | ICD-10-CM

## 2019-05-16 ENCOUNTER — Other Ambulatory Visit: Payer: Self-pay | Admitting: Family Medicine

## 2019-05-16 MED ORDER — DIAZEPAM 2 MG PO TABS: 1.0000 mg | ORAL_TABLET | Freq: Two times a day (BID) | ORAL | 0 refills | Status: DC | PRN

## 2019-05-17 ENCOUNTER — Encounter (HOSPITAL_COMMUNITY): Payer: Self-pay | Admitting: *Deleted

## 2019-05-17 ENCOUNTER — Emergency Department (HOSPITAL_COMMUNITY): Payer: Medicare HMO

## 2019-05-17 ENCOUNTER — Other Ambulatory Visit: Payer: Self-pay

## 2019-05-17 ENCOUNTER — Emergency Department (HOSPITAL_COMMUNITY)
Admission: EM | Admit: 2019-05-17 | Discharge: 2019-05-18 | Disposition: A | Discharge status: Home / Self Care | Payer: Medicare HMO | Attending: Emergency Medicine | Admitting: Emergency Medicine

## 2019-05-17 DIAGNOSIS — R112 Nausea with vomiting, unspecified: Secondary | ICD-10-CM | POA: Insufficient documentation

## 2019-05-17 DIAGNOSIS — G459 Transient cerebral ischemic attack, unspecified: Secondary | ICD-10-CM | POA: Diagnosis not present

## 2019-05-17 DIAGNOSIS — E039 Hypothyroidism, unspecified: Secondary | ICD-10-CM | POA: Diagnosis not present

## 2019-05-17 DIAGNOSIS — Z7982 Long term (current) use of aspirin: Secondary | ICD-10-CM | POA: Insufficient documentation

## 2019-05-17 DIAGNOSIS — Z20822 Contact with and (suspected) exposure to covid-19: Secondary | ICD-10-CM | POA: Diagnosis not present

## 2019-05-17 DIAGNOSIS — R509 Fever, unspecified: Secondary | ICD-10-CM | POA: Diagnosis not present

## 2019-05-17 DIAGNOSIS — I10 Essential (primary) hypertension: Secondary | ICD-10-CM | POA: Insufficient documentation

## 2019-05-17 DIAGNOSIS — Z79899 Other long term (current) drug therapy: Secondary | ICD-10-CM | POA: Diagnosis not present

## 2019-05-17 LAB — CBC WITH DIFFERENTIAL/PLATELET
Abs Immature Granulocytes: 0.04 10*3/uL (ref 0.00–0.07)
Basophils Absolute: 0 10*3/uL (ref 0.0–0.1)
Basophils Relative: 0 %
Eosinophils Absolute: 0.1 10*3/uL (ref 0.0–0.5)
Eosinophils Relative: 0 %
HCT: 47 % — ABNORMAL HIGH (ref 36.0–46.0)
Hemoglobin: 14.6 g/dL (ref 12.0–15.0)
Immature Granulocytes: 0 %
Lymphocytes Relative: 11 %
Lymphs Abs: 1.5 10*3/uL (ref 0.7–4.0)
MCH: 29.2 pg (ref 26.0–34.0)
MCHC: 31.1 g/dL (ref 30.0–36.0)
MCV: 94 fL (ref 80.0–100.0)
Monocytes Absolute: 0.6 10*3/uL (ref 0.1–1.0)
Monocytes Relative: 4 %
Neutro Abs: 11 10*3/uL — ABNORMAL HIGH (ref 1.7–7.7)
Neutrophils Relative %: 85 %
Platelets: 293 10*3/uL (ref 150–400)
RBC: 5 MIL/uL (ref 3.87–5.11)
RDW: 13 % (ref 11.5–15.5)
WBC: 13.1 10*3/uL — ABNORMAL HIGH (ref 4.0–10.5)
nRBC: 0 % (ref 0.0–0.2)

## 2019-05-17 LAB — URINALYSIS, ROUTINE W REFLEX MICROSCOPIC
Bacteria, UA: NONE SEEN
Bilirubin Urine: NEGATIVE
Glucose, UA: NEGATIVE mg/dL
Hgb urine dipstick: NEGATIVE
Ketones, ur: NEGATIVE mg/dL
Nitrite: NEGATIVE
Protein, ur: NEGATIVE mg/dL
Specific Gravity, Urine: 1.012 (ref 1.005–1.030)
pH: 7 (ref 5.0–8.0)

## 2019-05-17 LAB — COMPREHENSIVE METABOLIC PANEL
ALT: 19 U/L (ref 0–44)
AST: 25 U/L (ref 15–41)
Albumin: 4.4 g/dL (ref 3.5–5.0)
Alkaline Phosphatase: 73 U/L (ref 38–126)
Anion gap: 11 (ref 5–15)
BUN: 26 mg/dL — ABNORMAL HIGH (ref 8–23)
CO2: 26 mmol/L (ref 22–32)
Calcium: 9.4 mg/dL (ref 8.9–10.3)
Chloride: 100 mmol/L (ref 98–111)
Creatinine, Ser: 0.98 mg/dL (ref 0.44–1.00)
GFR calc Af Amer: >60 mL/min (ref >60)
GFR calc non Af Amer: 52 mL/min — ABNORMAL LOW (ref >60)
Glucose, Bld: 133 mg/dL — ABNORMAL HIGH (ref 70–99)
Potassium: 3.3 mmol/L — ABNORMAL LOW (ref 3.5–5.1)
Sodium: 137 mmol/L (ref 135–145)
Total Bilirubin: 0.6 mg/dL (ref 0.3–1.2)
Total Protein: 7.8 g/dL (ref 6.5–8.1)

## 2019-05-17 LAB — TROPONIN I (HIGH SENSITIVITY)
Troponin I (High Sensitivity): 4 ng/L (ref <18)
Troponin I (High Sensitivity): 5 ng/L (ref <18)

## 2019-05-17 LAB — LIPASE, BLOOD: Lipase: 39 U/L (ref 11–51)

## 2019-05-17 MED ORDER — DIPHENHYDRAMINE HCL 50 MG/ML IJ SOLN: 12.5000 mg | Freq: Once | INTRAMUSCULAR | Status: DC

## 2019-05-17 MED ORDER — PROCHLORPERAZINE EDISYLATE 10 MG/2ML IJ SOLN: 10.0000 mg | Freq: Once | INTRAMUSCULAR | Status: DC

## 2019-05-17 MED ORDER — ONDANSETRON HCL 4 MG/2ML IJ SOLN
4.0000 mg | Freq: Once | INTRAMUSCULAR | Status: AC
  Administered 2019-05-17: 4 mg via INTRAVENOUS
  Filled 2019-05-17: qty 2

## 2019-05-17 MED ORDER — LABETALOL HCL 5 MG/ML IV SOLN
10.0000 mg | Freq: Once | INTRAVENOUS | Status: AC
  Administered 2019-05-17: 10 mg via INTRAVENOUS
  Filled 2019-05-17: qty 4

## 2019-05-17 NOTE — ED Provider Notes (Signed)
[facility] EMERGENCY DEPARTMENT Provider Note   CSN: [id number] Arrival date & time: 05/17/19  1834     History Chief Complaint  Patient presents with  . Emesis    Valerie Davenport is a 86 y.o. female with a history of hypertension, hypothyroidism, hyperlipidemia, migraine headaches chronic back pain, also history of TIA presenting for evaluation of nausea and vomiting along with an elevated blood pressure reading at home of 205/98.  She describes chills and generalized headache which she woke with this am which she endorses is similar to prior migraine headaches.  She took tylenol this am with transient relief of headache pain which has since returned.  She went out lunch with friends and ate a hot dog,  After which she started having nausea and vomiting.  She has found no alleviators for her symptoms.  She reports upper abdominal cramping pain, no fevers, no diarrhea.  She has taken her medications today including her enalapril for blood pressure. She has no dizziness or focal weakness.   Of note, pt is extremely hard of hearing.    The history is provided by the patient and a relative (grandson with whom pt lives).       Past Medical History:  Diagnosis Date  . Anxiety    but doesn't require meds  . Arthritis   . Chronic back pain    HNP-takes Tramadol and Pain Pill(at night)  . History of blood transfusion    no abnormal reaction noted  . History of migraine    last one about 73yrs ago  . History of shingles   . Hyperlipidemia    takes Simvastatin daily  . Hypertension    takes Enalapril and Chlorthalidone daily  . Hypothyroidism    takes Levothyroxine daily  . Joint pain   . Nocturia     Patient Active Problem List   Diagnosis Date Noted  . Headache 03/12/2019  . TIA (transient ischemic attack) 03/11/2019  . Essential hypertension 11/10/2018  . HNP (herniated nucleus pulposus), lumbar 02/20/2012    Class: Diagnosis of    Past Surgical History:  Procedure  Laterality Date  . ABDOMINAL HYSTERECTOMY    . BACK SURGERY    . bilateral cataract surgery    . COLONOSCOPY    . LUMBAR LAMINECTOMY  02/20/2012  . LUMBAR LAMINECTOMY  02/20/2012   Procedure: MICRODISCECTOMY LUMBAR LAMINECTOMY;  Surgeon: Eldred Manges, MD;  Location: [facility] OR;  Service: Orthopedics;  Laterality: N/A;  Right L4 hemilaminectomy, microdiscectomy, removal of free fragment     OB History    Gravida  6   Para  5   Term  5   Preterm      AB  1   Living        SAB  1   TAB      Ectopic      Multiple      Live Births              Family History  Problem Relation Age of Onset  . Hypertension Other   . Liver cancer Daughter   . Heart disease Daughter   . Other Son        drowning  . COPD Son   . Breast cancer Neg Hx     Social History   Tobacco Use  . Smoking status: Never Smoker  . Smokeless tobacco: Never Used  Substance Use Topics  . Alcohol use: No  . Drug use: No  Home Medications Prior to Admission medications   Medication Sig Start Date End Date Taking? Authorizing Provider  ALPRAZolam (XANAX) 0.25 MG tablet Take 0.25 mg by mouth 2 (two) times daily. 12/03/18   [provider]  aspirin EC 81 MG tablet Take 1 tablet (81 mg total) by mouth daily. 03/12/19 03/11/20  Aquilla Hacker, MD  diazepam (VALIUM) 2 MG tablet Take 0.5-1 tablets (1-2 mg total) by mouth 2 (two) times daily as needed for anxiety. 05/16/19   Hilts, Casimiro Needle, MD  diclofenac Sodium (VOLTAREN) 1 % GEL Apply 4 g topically 4 (four) times daily as needed. 02/03/19   Hilts, Casimiro Needle, MD  enalapril (VASOTEC) 20 MG tablet Take 20 mg by mouth daily.    [provider]  HYDROcodone-acetaminophen (NORCO/VICODIN) 5-325 MG per tablet Take 1-2 tablets by mouth every 4 (four) hours as needed. 02/20/12   [name], PA-C  levothyroxine (SYNTHROID, LEVOTHROID) 50 MCG tablet Take 50 mcg by mouth daily.  10/27/13   [provider]  meclizine (ANTIVERT) 25 MG tablet  Take 25 mg by mouth 2 (two) times daily as needed. 01/10/19   [provider]  ondansetron (ZOFRAN ODT) 4 MG disintegrating tablet Take 1 tablet (4 mg total) by mouth every 8 (eight) hours as needed for nausea or vomiting. 05/18/19   Carlia Bomkamp, Raynelle Fanning, PA-C  simvastatin (ZOCOR) 20 MG tablet Take 20 mg by mouth every evening.    [provider]  amLODipine (NORVASC) 5 MG tablet Take 1 tablet (5 mg total) by mouth daily. Patient not taking: Reported on 11/10/2018 10/26/16 01/27/19  Glynn Octave, MD    Allergies    Patient has no known allergies.  Review of Systems   Review of Systems  Constitutional: Positive for chills. Negative for fever.  HENT: Negative for congestion and sore throat.   Eyes: Negative.   Respiratory: Negative for chest tightness and shortness of breath.   Cardiovascular: Negative for chest pain.  Gastrointestinal: Positive for nausea and vomiting. Negative for abdominal pain.  Genitourinary: Negative.   Musculoskeletal: Negative for arthralgias, joint swelling and neck pain.  Skin: Negative.  Negative for rash and wound.  Neurological: Positive for headaches. Negative for dizziness, weakness, light-headedness and numbness.  Psychiatric/Behavioral: Negative.     Physical Exam Updated Vital Signs BP 121/69   Pulse (!) 55   Temp (!) 100.9 F (38.3 C) (Rectal)   Resp 19   Ht 5\' 4"  (1.626 m)   Wt 59.4 kg   SpO2 94%   BMI 22.48 kg/m   Physical Exam Vitals and nursing note reviewed.  Constitutional:      Appearance: She is well-developed.  HENT:     Head: Normocephalic and atraumatic.     Comments: Very hard of hearing.      Right Ear: Tympanic membrane normal.     Left Ear: Tympanic membrane normal.     Mouth/Throat:     Mouth: Mucous membranes are moist.  Eyes:     Conjunctiva/sclera: Conjunctivae normal.  Cardiovascular:     Rate and Rhythm: Normal rate and regular rhythm.     Heart sounds: Normal heart sounds.     Comments: Borderline  tachy. Pulmonary:     Effort: Pulmonary effort is normal.     Breath sounds: Normal breath sounds. No wheezing.  Abdominal:     General: Bowel sounds are normal. There is no distension.     Palpations: Abdomen is soft. There is no mass.     Tenderness: There is  no abdominal tenderness. There is no guarding.     Comments: Active dry heaving upon first arrival.   Musculoskeletal:        General: Normal range of motion.     Cervical back: Normal range of motion. No rigidity or tenderness.  Skin:    General: Skin is warm and dry.  Neurological:     General: No focal deficit present.     Mental Status: She is alert and oriented to person, place, and time.     Cranial Nerves: No cranial nerve deficit.     ED Results / Procedures / Treatments   Labs (all labs ordered are listed, but only abnormal results are displayed) Labs Reviewed  CBC WITH DIFFERENTIAL/PLATELET - Abnormal; Notable for the following components:      Result Value   WBC 13.1 (*)    HCT 47.0 (*)    Neutro Abs 11.0 (*)    All other components within normal limits  COMPREHENSIVE METABOLIC PANEL - Abnormal; Notable for the following components:   Potassium 3.3 (*)    Glucose, Bld 133 (*)    BUN 26 (*)    GFR calc non Af Amer 52 (*)    All other components within normal limits  URINALYSIS, ROUTINE W REFLEX MICROSCOPIC - Abnormal; Notable for the following components:   Color, Urine STRAW (*)    Leukocytes,Ua TRACE (*)    All other components within normal limits  SARS CORONAVIRUS 2 (TAT 6-24 HRS)  LIPASE, BLOOD  TROPONIN I (HIGH SENSITIVITY)  TROPONIN I (HIGH SENSITIVITY)    EKG None  Radiology CT Head Wo Contrast  Result Date: 05/17/2019 CLINICAL DATA:  Acute headache. Normal neuro exam. Elevated blood pressure. EXAM: CT HEAD WITHOUT CONTRAST TECHNIQUE: Contiguous axial images were obtained from the base of the skull through the vertex without intravenous contrast. COMPARISON:  Head CT and brain MRI  03/11/2019 FINDINGS: Brain: No intracranial hemorrhage, mass effect, or midline shift. Stable degree of atrophy and chronic small vessel ischemia from prior exam. Bilateral basal gangliar mineralization, likely senescent and unchanged. No hydrocephalus. The basilar cisterns are patent. No evidence of territorial infarct or acute ischemia. No extra-axial or intracranial fluid collection. Vascular: Atherosclerosis of skullbase vasculature without hyperdense vessel or abnormal calcification. Skull: No fracture or focal lesion. Sinuses/Orbits: Paranasal sinuses and mastoid air cells are clear. The visualized orbits are unremarkable. Bilateral cataract resection. Other: None. IMPRESSION: 1. No acute intracranial abnormality. 2. Stable atrophy and chronic small vessel ischemia. Electronically Signed   By: Narda Rutherford M.D.   On: 05/17/2019 21:09   DG Chest Port 1 View  Result Date: 05/17/2019 CLINICAL DATA:  Fever EXAM: PORTABLE CHEST 1 VIEW COMPARISON:  December 09, 2013 FINDINGS: The heart size and mediastinal contours are within normal limits. Mildly increased interstitial markings seen at both lung bases, likely due to chronic lung changes or subsegmental atelectasis. The visualized skeletal structures are unremarkable. IMPRESSION: No active disease. Electronically Signed   By: Jonna Clark M.D.   On: 05/17/2019 23:53    Procedures Procedures (including critical care time)  Medications Ordered in ED Medications  acetaminophen (TYLENOL) tablet 650 mg (has no administration in time range)  ondansetron (ZOFRAN) injection 4 mg (4 mg Intravenous Given 05/17/19 1947)  labetalol (NORMODYNE) injection 10 mg (10 mg Intravenous Given 05/17/19 2206)    ED Course  I have reviewed the triage vital signs and the nursing notes.  Pertinent labs & imaging results that were available during my care of  the patient were reviewed by me and considered in my medical decision making (see chart for details).    MDM  Rules/Calculators/A&P                      Pt with elevation in blood pressure along with headache nausea and vomiting.  Reports headache sx similar to migraines.  CT imaging negative for acute intracranial process, no head bleed. Normal neuro exam.    She was given labetalol and her bp responded completely along with resolution of headache.  She was also given zofran, nausea improved and pt was able to tolerate PO fluids.  She was sx free at end of work up but felt feverish - check of temp 100.9.  CXR ordered - negative for infection.  UA normal. She has received her second covid 19 vaccine about 2 weeks ago. She was screened today however to rule out this possibility.  Discussed dc home with pt and her grandson who are agreeable to this plan.  Discussed return precautions and/or recheck by pcp.  Given Covid precautions as well until test results and negative.  The patient appears reasonably screened and/or stabilized for discharge and I doubt any other medical condition or other [facility] requiring further screening, evaluation, or treatment in the ED at this time prior to discharge.     NERI VIEYRA was evaluated in Emergency Department on 05/18/2019 for the symptoms described in the history of present illness. She was evaluated in the context of the global COVID-19 pandemic, which necessitated consideration that the patient might be at risk for infection with the SARS-CoV-2 virus that causes COVID-19. Institutional protocols and algorithms that pertain to the evaluation of patients at risk for COVID-19 are in a state of rapid change based on information released by regulatory bodies including the CDC and federal and state organizations. These policies and algorithms were followed during the patient's care in the ED.  Final Clinical Impression(s) / ED Diagnoses Final diagnoses:  Essential hypertension  Febrile illness  Non-intractable vomiting with nausea, unspecified vomiting type    Rx / DC Orders  ED Discharge Orders         Ordered    ondansetron (ZOFRAN ODT) 4 MG disintegrating tablet  Every 8 hours PRN     05/18/19 0027           Evalee Jefferson, PA-C 05/18/19 0043    Noemi Chapel, MD 05/21/19 [phone]

## 2019-05-17 NOTE — ED Triage Notes (Signed)
Pt brought in by grandson for c/o n/v and high blood pressure reading at home; BP at home was 205/98; pt c/o chills and a headache

## 2019-05-17 NOTE — ED Provider Notes (Signed)
Medical screening examination/treatment/procedure(s) were conducted as a shared visit with non-physician practitioner(s) and myself.  I personally evaluated the patient during the encounter.  Clinical Impression:   Final diagnoses:  Essential hypertension  Febrile illness  Non-intractable vomiting with nausea, unspecified vomiting type    EKG performed May 17, 2019 at 7:42 PM shows normal sinus rhythm, normal axis, normal intervals, normal ST segments, normal T waves, this is an unremarkable EKG   Eber Hong, MD 05/21/19 [phone]

## 2019-05-18 LAB — SARS CORONAVIRUS 2 (TAT 6-24 HRS): SARS Coronavirus 2: NEGATIVE

## 2019-05-18 MED ORDER — ACETAMINOPHEN 325 MG PO TABS
650.0000 mg | ORAL_TABLET | Freq: Once | ORAL | Status: AC
  Administered 2019-05-18: 650 mg via ORAL
  Filled 2019-05-18: qty 2

## 2019-05-18 MED ORDER — ONDANSETRON 4 MG PO TBDP: 4.0000 mg | ORAL_TABLET | Freq: Three times a day (TID) | ORAL | 0 refills | Status: DC | PRN

## 2019-05-18 NOTE — Discharge Instructions (Signed)
Your lab tests, xrays and CT scan and exam tonight are reassuring but we do suspect you have a viral illness with the spiking of a low grade fever here.  Even though you have received the covid 19 vaccines, you are being tested for this possible infection - this result should be back by tomorrow and you can check on MyChart for the result.  We will also contact you if your test is positive.  You should stay home (socially isolate) until you know this test is negative.  Continue treating your fever if it returns with tylenol and use the zofran if needed for return of nausea or vomiting.

## 2019-06-20 ENCOUNTER — Ambulatory Visit: Payer: Medicare HMO | Admitting: Family Medicine

## 2019-06-20 ENCOUNTER — Other Ambulatory Visit: Payer: Self-pay

## 2019-06-20 ENCOUNTER — Encounter: Payer: Self-pay | Admitting: Family Medicine

## 2019-06-20 VITALS — BP 177/95 | HR 88 | Ht 64.0 in | Wt 131.8 lb

## 2019-06-20 DIAGNOSIS — R42 Dizziness and giddiness: Secondary | ICD-10-CM

## 2019-06-20 DIAGNOSIS — E039 Hypothyroidism, unspecified: Secondary | ICD-10-CM | POA: Diagnosis not present

## 2019-06-20 DIAGNOSIS — I10 Essential (primary) hypertension: Secondary | ICD-10-CM | POA: Diagnosis not present

## 2019-06-20 MED ORDER — SIMVASTATIN 20 MG PO TABS: 20.0000 mg | ORAL_TABLET | Freq: Every day | ORAL | 1 refills | Status: DC

## 2019-06-20 MED ORDER — LEVOTHYROXINE SODIUM 50 MCG PO TABS: 50.0000 ug | ORAL_TABLET | Freq: Every day | ORAL | 1 refills | Status: DC

## 2019-06-20 MED ORDER — ENALAPRIL MALEATE 20 MG PO TABS: 20.0000 mg | ORAL_TABLET | Freq: Every day | ORAL | 1 refills | Status: DC

## 2019-06-20 NOTE — Progress Notes (Signed)
Office Visit Note   Patient: Valerie Davenport           Date of Birth: May 12, 1931           MRN: [id number] Visit Date: 06/20/2019 Requested by: Eunice Blase, MD [address] [place],  [place] [address] PCP: Eunice Blase, MD  Subjective: Chief Complaint  Patient presents with  . medication check    HPI: She is here for monitoring of medical conditions.  Since last visit she got hearing aids and is very pleased with them.  From a blood pressure standpoint she has not checked her pressure at home.  She is supposed to be on enalapril but did not bring all of her pills with her today.  She continues to complain of intermittent dizziness especially when bending forward.  She is due for monitoring of hypothyroidism.  She takes 50 mcg of Synthroid daily.  She is unsteady when walking and would like a prescription for a cane.                ROS:   All other systems were reviewed and are negative.  Objective: Vital Signs: BP (!) 177/95   Pulse 88   Ht 5\' 4"  (1.626 m)   Wt 131 lb 12.8 oz (59.8 kg)   BMI 22.62 kg/m   Physical Exam:  General:  Alert and oriented, in no acute distress. Pulm:  Breathing unlabored. Psy:  Normal mood, congruent affect.  Neck: No thyromegaly or nodules.  No carotid bruits. CV: Regular rate and rhythm without murmurs, rubs, or gallops.  No peripheral edema.  2+ radial and posterior tibial pulses. Lungs: Clear to auscultation throughout with no wheezing or areas of consolidation.    Imaging: No results found.  Assessment & Plan: 1.  Hypertension, suboptimally controlled.  She was a bit stressed out when her blood pressure was checked this morning. -Refilled medication.  Recheck at the pharmacy.  Follow-up in 3 months.  2.  Hypothyroidism -Labs to monitor.  3.  Dizziness with unsteady gait -Prescription for a cane.     Procedures: No procedures performed  No notes on file     PMFS History: Patient Active Problem List   Diagnosis  Date Noted  . Hypothyroid 06/20/2019  . Headache 03/12/2019  . TIA (transient ischemic attack) 03/11/2019  . Essential hypertension 11/10/2018  . HNP (herniated nucleus pulposus), lumbar 02/20/2012    Class: Diagnosis of   Past Medical History:  Diagnosis Date  . Anxiety    but doesn't require meds  . Arthritis   . Chronic back pain    HNP-takes Tramadol and Pain Pill(at night)  . History of blood transfusion    no abnormal reaction noted  . History of migraine    last one about 38yrs ago  . History of shingles   . Hyperlipidemia    takes Simvastatin daily  . Hypertension    takes Enalapril and Chlorthalidone daily  . Hypothyroidism    takes Levothyroxine daily  . Joint pain   . Nocturia     Family History  Problem Relation Age of Onset  . Hypertension Other   . Liver cancer Daughter   . Heart disease Daughter   . Other Son        drowning  . COPD Son   . Breast cancer Neg Hx     Past Surgical History:  Procedure Laterality Date  . ABDOMINAL HYSTERECTOMY    . BACK SURGERY    . bilateral  cataract surgery    . COLONOSCOPY    . LUMBAR LAMINECTOMY  02/20/2012  . LUMBAR LAMINECTOMY  02/20/2012   Procedure: MICRODISCECTOMY LUMBAR LAMINECTOMY;  Surgeon: Eldred Manges, MD;  Location: [facility] OR;  Service: Orthopedics;  Laterality: N/A;  Right L4 hemilaminectomy, microdiscectomy, removal of free fragment   Social History   Occupational History  . Not on file  Tobacco Use  . Smoking status: Never Smoker  . Smokeless tobacco: Never Used  Substance and Sexual Activity  . Alcohol use: No  . Drug use: No  . Sexual activity: Not Currently    Birth control/protection: Surgical    Comment: hyst

## 2019-06-21 ENCOUNTER — Telehealth: Payer: Self-pay | Admitting: Family Medicine

## 2019-06-21 LAB — THYROID PANEL WITH TSH
Free Thyroxine Index: 2.1 (ref 1.4–3.8)
T3 Uptake: 31 % (ref 22–35)
T4, Total: 6.7 ug/dL (ref 5.1–11.9)
TSH: 3.36 mIU/L (ref 0.40–4.50)

## 2019-06-21 NOTE — Telephone Encounter (Signed)
Left this information on the patient's personal voice mail. Advised her to call me back if she has any questions/concerns.

## 2019-06-21 NOTE — Telephone Encounter (Signed)
Thyroid studies are in normal range.  No need for any dosage changes right now.

## 2019-07-07 ENCOUNTER — Encounter: Payer: Self-pay | Admitting: Obstetrics & Gynecology

## 2019-07-07 ENCOUNTER — Ambulatory Visit: Payer: Medicare HMO | Admitting: Obstetrics & Gynecology

## 2019-07-07 VITALS — Ht 61.0 in | Wt 126.0 lb

## 2019-07-07 DIAGNOSIS — R350 Frequency of micturition: Secondary | ICD-10-CM

## 2019-07-07 DIAGNOSIS — Z466 Encounter for fitting and adjustment of urinary device: Secondary | ICD-10-CM

## 2019-07-07 DIAGNOSIS — N3000 Acute cystitis without hematuria: Secondary | ICD-10-CM

## 2019-07-07 DIAGNOSIS — Z4689 Encounter for fitting and adjustment of other specified devices: Secondary | ICD-10-CM

## 2019-07-07 MED ORDER — SULFAMETHOXAZOLE-TRIMETHOPRIM 800-160 MG PO TABS: 1.0000 | ORAL_TABLET | Freq: Two times a day (BID) | ORAL | 0 refills | Status: DC

## 2019-07-07 NOTE — Progress Notes (Signed)
Chief Complaint  Patient presents with  . Pessary Check    ? UTI      86 y.o. G6P5010 No LMP recorded. Patient has had a hysterectomy. The current method of family planning is post menopausal status.  Outpatient Encounter Medications as of 07/07/2019  Medication Sig Note  . ALPRAZolam (XANAX) 0.25 MG tablet Take 0.25 mg by mouth 2 (two) times daily. 02/03/2019: prn  . aspirin EC 81 MG tablet Take 1 tablet (81 mg total) by mouth daily.   . diazepam (VALIUM) 2 MG tablet Take 0.5-1 tablets (1-2 mg total) by mouth 2 (two) times daily as needed for anxiety.   . diclofenac Sodium (VOLTAREN) 1 % GEL Apply 4 g topically 4 (four) times daily as needed.   . enalapril (VASOTEC) 20 MG tablet Take 1 tablet (20 mg total) by mouth daily.   Marland Kitchen HYDROcodone-acetaminophen (NORCO/VICODIN) 5-325 MG per tablet Take 1-2 tablets by mouth every 4 (four) hours as needed.   Marland Kitchen levothyroxine (SYNTHROID) 50 MCG tablet Take 1 tablet (50 mcg total) by mouth daily before breakfast.   . meclizine (ANTIVERT) 25 MG tablet Take 25 mg by mouth 2 (two) times daily as needed. 02/03/2019: prn  . ondansetron (ZOFRAN ODT) 4 MG disintegrating tablet Take 1 tablet (4 mg total) by mouth every 8 (eight) hours as needed for nausea or vomiting.   . simvastatin (ZOCOR) 20 MG tablet Take 1 tablet (20 mg total) by mouth daily.   Marland Kitchen sulfamethoxazole-trimethoprim (BACTRIM DS) 800-160 MG tablet Take 1 tablet by mouth 2 (two) times daily.   . [DISCONTINUED] amLODipine (NORVASC) 5 MG tablet Take 1 tablet (5 mg total) by mouth daily. (Patient not taking: Reported on 11/10/2018)    No facility-administered encounter medications on file as of 07/07/2019.    Subjective Pt with urinary frequency suprapubic pain pressure for 2 days H\like previous UTIs  Also has pessary so will remove clean replace today as well Past Medical History:  Diagnosis Date  . Anxiety    but doesn't require meds  . Arthritis   . Chronic back pain    HNP-takes  Tramadol and Pain Pill(at night)  . History of blood transfusion    no abnormal reaction noted  . History of migraine    last one about 53yrs ago  . History of shingles   . Hyperlipidemia    takes Simvastatin daily  . Hypertension    takes Enalapril and Chlorthalidone daily  . Hypothyroidism    takes Levothyroxine daily  . Joint pain   . Nocturia     Past Surgical History:  Procedure Laterality Date  . ABDOMINAL HYSTERECTOMY    . BACK SURGERY    . bilateral cataract surgery    . COLONOSCOPY    . LUMBAR LAMINECTOMY  02/20/2012  . LUMBAR LAMINECTOMY  02/20/2012   Procedure: MICRODISCECTOMY LUMBAR LAMINECTOMY;  Surgeon: Eldred Manges, MD;  Location: [facility] OR;  Service: Orthopedics;  Laterality: N/A;  Right L4 hemilaminectomy, microdiscectomy, removal of free fragment    OB History    Gravida  6   Para  5   Term  5   Preterm      AB  1   Living        SAB  1   TAB      Ectopic      Multiple      Live Births              No  Known Allergies  Social History   Socioeconomic History  . Marital status: Widowed    Spouse name: Not on file  . Number of children: Not on file  . Years of education: Not on file  . Highest education level: Not on file  Occupational History  . Not on file  Tobacco Use  . Smoking status: Never Smoker  . Smokeless tobacco: Never Used  Vaping Use  . Vaping Use: Never used  Substance and Sexual Activity  . Alcohol use: No  . Drug use: No  . Sexual activity: Not Currently    Birth control/protection: Surgical    Comment: hyst  Other Topics Concern  . Not on file  Social History Narrative  . Not on file   Social Determinants of Health   Financial Resource Strain:   . Difficulty of Paying Living Expenses:   Food Insecurity:   . Worried About Charity fundraiser in the Last Year:   . Arboriculturist in the Last Year:   Transportation Needs:   . Film/video editor (Medical):   Marland Kitchen Lack of Transportation (Non-Medical):     Physical Activity:   . Days of Exercise per Week:   . Minutes of Exercise per Session:   Stress:   . Feeling of Stress :   Social Connections:   . Frequency of Communication with Friends and Family:   . Frequency of Social Gatherings with Friends and Family:   . Attends Religious Services:   . Active Member of Clubs or Organizations:   . Attends Archivist Meetings:   Marland Kitchen Marital Status:     Family History  Problem Relation Age of Onset  . Hypertension Other   . Liver cancer Daughter   . Heart disease Daughter   . Other Son        drowning  . COPD Son   . Breast cancer Neg Hx     Medications:       Current Outpatient Medications:  .  ALPRAZolam (XANAX) 0.25 MG tablet, Take 0.25 mg by mouth 2 (two) times daily., Disp: , Rfl:  .  aspirin EC 81 MG tablet, Take 1 tablet (81 mg total) by mouth daily., Disp: 30 tablet, Rfl: 11 .  diazepam (VALIUM) 2 MG tablet, Take 0.5-1 tablets (1-2 mg total) by mouth 2 (two) times daily as needed for anxiety., Disp: 20 tablet, Rfl: 0 .  diclofenac Sodium (VOLTAREN) 1 % GEL, Apply 4 g topically 4 (four) times daily as needed., Disp: 500 g, Rfl: 6 .  enalapril (VASOTEC) 20 MG tablet, Take 1 tablet (20 mg total) by mouth daily., Disp: 90 tablet, Rfl: 1 .  HYDROcodone-acetaminophen (NORCO/VICODIN) 5-325 MG per tablet, Take 1-2 tablets by mouth every 4 (four) hours as needed., Disp: 60 tablet, Rfl: 0 .  levothyroxine (SYNTHROID) 50 MCG tablet, Take 1 tablet (50 mcg total) by mouth daily before breakfast., Disp: 90 tablet, Rfl: 1 .  meclizine (ANTIVERT) 25 MG tablet, Take 25 mg by mouth 2 (two) times daily as needed., Disp: , Rfl:  .  ondansetron (ZOFRAN ODT) 4 MG disintegrating tablet, Take 1 tablet (4 mg total) by mouth every 8 (eight) hours as needed for nausea or vomiting., Disp: 20 tablet, Rfl: 0 .  simvastatin (ZOCOR) 20 MG tablet, Take 1 tablet (20 mg total) by mouth daily., Disp: 90 tablet, Rfl: 1 .  sulfamethoxazole-trimethoprim (BACTRIM  DS) 800-160 MG tablet, Take 1 tablet by mouth 2 (two) times daily., Disp: 14  tablet, Rfl: 0  Objective Height 5\' 1"  (1.549 m), weight 126 lb (57.2 kg).  Exam is normal  Pessary fitting well No mucosal issues   Pertinent ROS  No nausea, vomiting or diarrhea Nor fever chills or other constitutional symptoms   Labs or studies Threw her urine away    Impression Diagnoses this Encounter::   ICD-10-CM   1. Acute cystitis without hematuria  N30.00   2. Urinary frequency  R35.0 CANCELED: POCT Urinalysis Dipstick  3. Pessary maintenance, Milex ring with support #5  Z46.89     Established relevant diagnosis(es):   Plan/Recommendations: Meds ordered this encounter  Medications  . sulfamethoxazole-trimethoprim (BACTRIM DS) 800-160 MG tablet    Sig: Take 1 tablet by mouth 2 (two) times daily.    Dispense:  14 tablet    Refill:  0    Labs or Scans Ordered: No orders of the defined types were placed in this encounter.   Management:: >pessary was removed cleaned replced >Bactrim DS for susprected UTI  Follow up Return in about 1 month (around 08/06/2019) for Follow up, pessary, with Dr 10/07/2019.      All questions were answered.

## 2019-07-12 ENCOUNTER — Ambulatory Visit: Payer: Medicare HMO | Admitting: Family Medicine

## 2019-08-11 ENCOUNTER — Other Ambulatory Visit: Payer: Self-pay | Admitting: Family Medicine

## 2019-08-22 ENCOUNTER — Ambulatory Visit: Payer: Medicare HMO | Admitting: Obstetrics & Gynecology

## 2019-08-22 ENCOUNTER — Encounter: Payer: Self-pay | Admitting: Obstetrics & Gynecology

## 2019-08-22 VITALS — BP 143/82 | HR 101 | Ht 61.0 in | Wt 131.5 lb

## 2019-08-22 DIAGNOSIS — Z4689 Encounter for fitting and adjustment of other specified devices: Secondary | ICD-10-CM

## 2019-08-22 DIAGNOSIS — N819 Female genital prolapse, unspecified: Secondary | ICD-10-CM

## 2019-08-22 NOTE — Progress Notes (Signed)
Chief Complaint  Patient presents with  . Pessary Check    Blood pressure (!) 143/82, pulse 101, height 5\' 1"  (1.549 m), weight 131 lb 8 oz (59.6 kg).  presents today for routine follow up related to her pessary.   She uses a Milex ring with support #6 She reports no vaginal discharge or vaginal bleeding.  Exam reveals no undue vaginal mucosal pressure of breakdown, no discharge and no vaginal bleeding.  The pessary is removed, cleaned and replaced without difficulty.    Maire A Hackley will be sen back in 4 months for continued follow up.  [name], MD  08/22/2019 2:08 PM

## 2019-09-20 ENCOUNTER — Ambulatory Visit (INDEPENDENT_AMBULATORY_CARE_PROVIDER_SITE_OTHER): Payer: Medicare HMO | Admitting: Family Medicine

## 2019-09-20 ENCOUNTER — Other Ambulatory Visit: Payer: Self-pay

## 2019-09-20 ENCOUNTER — Encounter: Payer: Self-pay | Admitting: Family Medicine

## 2019-09-20 VITALS — BP 107/67 | HR 79 | Temp 98.2°F | Ht 61.0 in | Wt 129.0 lb

## 2019-09-20 DIAGNOSIS — R5383 Other fatigue: Secondary | ICD-10-CM

## 2019-09-20 DIAGNOSIS — R200 Anesthesia of skin: Secondary | ICD-10-CM | POA: Diagnosis not present

## 2019-09-20 DIAGNOSIS — I10 Essential (primary) hypertension: Secondary | ICD-10-CM | POA: Diagnosis not present

## 2019-09-20 DIAGNOSIS — J029 Acute pharyngitis, unspecified: Secondary | ICD-10-CM

## 2019-09-20 DIAGNOSIS — R202 Paresthesia of skin: Secondary | ICD-10-CM

## 2019-09-20 NOTE — Patient Instructions (Signed)
    Multi-vitamin once daily (Centrum Silver, for example)

## 2019-09-20 NOTE — Progress Notes (Signed)
Office Visit Note   Patient: Valerie Davenport           Date of Birth: 01/06/1932           MRN: [id number] Visit Date: 09/20/2019 Requested by: Lavada Mesi, MD [address] [place],  [place] [address] PCP: Lavada Mesi, MD  Subjective: Chief Complaint  Patient presents with   Blood Pressure Check   ST/hurts to swallow x >1 week    HPI: She is here for follow-up hypertension.  Doing much better off amlodipine.  She is no longer having dizziness.  She is taking enalapril 20 mg daily.  She has had a mild sore throat for the past week with no other symptoms.  She complains of getting tired easily with exertion.  Denies any chest pain along with it.  This has been going on for many months.  She has been having numbness in her left hand when sleeping at night and sometimes during the daytime.                ROS:   All other systems were reviewed and are negative.  Objective: Vital Signs: BP 107/67    Pulse 79    Temp 98.2 F (36.8 C)    Ht 5\' 1"  (1.549 m)    Wt 129 lb (58.5 kg)    BMI 24.37 kg/m   Physical Exam:  General:  Alert and oriented, in no acute distress. Pulm:  Breathing unlabored. Psy:  Normal mood, congruent affect.  HEENT:  [place]/AT, PERRLA, EOM Full, no nystagmus.  Funduscopic examination within normal limits.  No conjunctival erythema.  Tympanic membranes are pearly gray with normal landmarks.  External ear canals are normal.  Nasal passages are clear.  Oropharynx is clear.  No significant lymphadenopathy.  No thyromegaly or nodules.  2+ carotid pulses without bruits. CV: Regular rate and rhythm without murmurs, rubs, or gallops.  No peripheral edema.  2+ radial and posterior tibial pulses. Lungs: Clear to auscultation throughout with no wheezing or areas of consolidation. Left hand: She has no thenar atrophy.  Positive Tinel's at the carpal tunnel and positive Phalen's test.   Imaging: No results found.  Assessment & Plan: 1.  Hypertension, now well  controlled. -Refills when needed.  Follow-up in 3 months.  CBC and basic metabolic panel today.  2.  Fatigue, etiology uncertain. -We will check thyroid studies.  3.  Left hand carpal tunnel syndrome -Carpal tunnel night splint.  4.  Sore throat, could be a viral syndrome. -Over-the-counter symptomatic treatment.     Procedures: No procedures performed  No notes on file     PMFS History: Patient Active Problem List   Diagnosis Date Noted   Hypothyroid 06/20/2019   Headache 03/12/2019   TIA (transient ischemic attack) 03/11/2019   Essential hypertension 11/10/2018   HNP (herniated nucleus pulposus), lumbar 02/20/2012    Class: Diagnosis of   Past Medical History:  Diagnosis Date   Anxiety    but doesn't require meds   Arthritis    Chronic back pain    HNP-takes Tramadol and Pain Pill(at night)   History of blood transfusion    no abnormal reaction noted   History of migraine    last one about 56yrs ago   History of shingles    Hyperlipidemia    takes Simvastatin daily   Hypertension    takes Enalapril and Chlorthalidone daily   Hypothyroidism    takes Levothyroxine daily   Joint pain  Nocturia     Family History  Problem Relation Age of Onset   Hypertension Other    Liver cancer Daughter    Heart disease Daughter    Other Son        drowning   COPD Son    Breast cancer Neg Hx     Past Surgical History:  Procedure Laterality Date   ABDOMINAL HYSTERECTOMY     BACK SURGERY     bilateral cataract surgery     COLONOSCOPY     LUMBAR LAMINECTOMY  02/20/2012   LUMBAR LAMINECTOMY  02/20/2012   Procedure: MICRODISCECTOMY LUMBAR LAMINECTOMY;  Surgeon: Eldred Manges, MD;  Location: [facility] OR;  Service: Orthopedics;  Laterality: N/A;  Right L4 hemilaminectomy, microdiscectomy, removal of free fragment   Social History   Occupational History   Not on file  Tobacco Use   Smoking status: Never Smoker   Smokeless tobacco: Never  Used  Vaping Use   Vaping Use: Never used  Substance and Sexual Activity   Alcohol use: No   Drug use: No   Sexual activity: Not Currently    Birth control/protection: Surgical    Comment: hyst

## 2019-09-21 ENCOUNTER — Telehealth: Payer: Self-pay | Admitting: Family Medicine

## 2019-09-21 LAB — CBC WITH DIFFERENTIAL/PLATELET
Absolute Monocytes: 646 cells/uL (ref 200–950)
Basophils Absolute: 43 cells/uL (ref 0–200)
Basophils Relative: 0.6 %
Eosinophils Absolute: 128 cells/uL (ref 15–500)
Eosinophils Relative: 1.8 %
HCT: 41.5 % (ref 35.0–45.0)
Hemoglobin: 13.2 g/dL (ref 11.7–15.5)
Lymphs Abs: 1846 cells/uL (ref 850–3900)
MCH: 28.1 pg (ref 27.0–33.0)
MCHC: 31.8 g/dL — ABNORMAL LOW (ref 32.0–36.0)
MCV: 88.3 fL (ref 80.0–100.0)
MPV: 11.8 fL (ref 7.5–12.5)
Monocytes Relative: 9.1 %
Neutro Abs: 4438 cells/uL (ref 1500–7800)
Neutrophils Relative %: 62.5 %
Platelets: 262 10*3/uL (ref 140–400)
RBC: 4.7 10*6/uL (ref 3.80–5.10)
RDW: 13.3 % (ref 11.0–15.0)
Total Lymphocyte: 26 %
WBC: 7.1 10*3/uL (ref 3.8–10.8)

## 2019-09-21 LAB — BASIC METABOLIC PANEL
BUN/Creatinine Ratio: 32 (calc) — ABNORMAL HIGH (ref 6–22)
BUN: 30 mg/dL — ABNORMAL HIGH (ref 7–25)
CO2: 27 mmol/L (ref 20–32)
Calcium: 10 mg/dL (ref 8.6–10.4)
Chloride: 105 mmol/L (ref 98–110)
Creat: 0.95 mg/dL — ABNORMAL HIGH (ref 0.60–0.88)
Glucose, Bld: 98 mg/dL (ref 65–99)
Potassium: 4.4 mmol/L (ref 3.5–5.3)
Sodium: 140 mmol/L (ref 135–146)

## 2019-09-21 LAB — THYROID PANEL WITH TSH
Free Thyroxine Index: 2.5 (ref 1.4–3.8)
T3 Uptake: 32 % (ref 22–35)
T4, Total: 7.8 ug/dL (ref 5.1–11.9)
TSH: 1.65 mIU/L (ref 0.40–4.50)

## 2019-09-21 NOTE — Telephone Encounter (Signed)
Labs look good.  Kidney function/creatinine is improving.  Will recheck in 3-4 months.

## 2019-11-13 ENCOUNTER — Other Ambulatory Visit: Payer: Self-pay | Admitting: Family Medicine

## 2019-12-01 ENCOUNTER — Other Ambulatory Visit: Payer: Self-pay

## 2019-12-01 ENCOUNTER — Encounter: Payer: Self-pay | Admitting: Family Medicine

## 2019-12-01 ENCOUNTER — Ambulatory Visit (INDEPENDENT_AMBULATORY_CARE_PROVIDER_SITE_OTHER): Payer: Medicare HMO | Admitting: Family Medicine

## 2019-12-01 VITALS — BP 105/69 | HR 84 | Temp 98.2°F

## 2019-12-01 DIAGNOSIS — H9202 Otalgia, left ear: Secondary | ICD-10-CM

## 2019-12-01 DIAGNOSIS — M25562 Pain in left knee: Secondary | ICD-10-CM

## 2019-12-01 MED ORDER — AZITHROMYCIN 250 MG PO TABS: ORAL_TABLET | ORAL | 0 refills | Status: DC

## 2019-12-01 MED ORDER — CELECOXIB 100 MG PO CAPS: 100.0000 mg | ORAL_CAPSULE | Freq: Two times a day (BID) | ORAL | 3 refills | Status: DC | PRN

## 2019-12-01 NOTE — Progress Notes (Signed)
Office Visit Note   Patient: Valerie Davenport           Date of Birth: [birth year]           MRN: [id number] Visit Date: 12/01/2019 Requested by: Lavada Mesi, MD [address] [place],  [place] [address] PCP: Lavada Mesi, MD  Subjective: Chief Complaint  Patient presents with  . Left Knee - Pain    Pain x 2 months, posterior knee - cannot flex the knee to 90 degrees.  Marland Kitchen left ear pain and sore throat x 1 week    HPI: She is here for left ear and left knee pain.  When I saw her 2 months ago she was having soreness in her throat.  She continues to have that.  She also states that her ear has been sore for the past week and a half, it hurts to put her hearing aid in.  No fevers or chills, mild cough.  For the past couple weeks her knee has been hurting mostly on the posterior aspect.  It hurts to flex it beyond 90 degrees.  She has tried Voltaren gel but is not helping.  She will be going to the beach next week with family.              ROS:   All other systems were reviewed and are negative.  Objective: Vital Signs: BP 105/69   Pulse 84   Temp 98.2 F (36.8 C)   Physical Exam:  General:  Alert and oriented, in no acute distress. Pulm:  Breathing unlabored. Psy:  Normal mood, congruent affect.  HEENT:  [place]/AT, PERRLA, EOM Full, no nystagmus.  Funduscopic examination within normal limits.  No conjunctival erythema.  Tympanic membranes are pearly gray with normal landmarks.  External ear canals are normal.  Nasal passages are clear.  Oropharynx is clear.  She has a tender lymph node in the left anterior cervical chain..  No thyromegaly or nodules.  2+ carotid pulses without bruits. Left knee: 1+ effusion with no warmth.  She has fullness in the popliteal fossa, no definite cyst.  Tender on the medial joint line.  Imaging: No results found.  Assessment & Plan: 1.  Persistent sore throat with left ear pain, etiology uncertain. -We will try Zithromax.  She will contact me if  symptoms persist, at that point she will go see her audiologist again.  2.  Left knee pain with effusion, probable DJD -Discussed options and she does not want a cortisone injection.  We will try Celebrex.  Could inject in the future if symptoms persist.     Procedures: No procedures performed  No notes on file     PMFS History: Patient Active Problem List   Diagnosis Date Noted  . Hypothyroid 06/20/2019  . Headache 03/12/2019  . TIA (transient ischemic attack) 03/11/2019  . Essential hypertension 11/10/2018  . HNP (herniated nucleus pulposus), lumbar 02/20/2012    Class: Diagnosis of   Past Medical History:  Diagnosis Date  . Anxiety    but doesn't require meds  . Arthritis   . Chronic back pain    HNP-takes Tramadol and Pain Pill(at night)  . History of blood transfusion    no abnormal reaction noted  . History of migraine    last one about 81yrs ago  . History of shingles   . Hyperlipidemia    takes Simvastatin daily  . Hypertension    takes Enalapril and Chlorthalidone daily  . Hypothyroidism  takes Levothyroxine daily  . Joint pain   . Nocturia     Family History  Problem Relation Age of Onset  . Hypertension Other   . Liver cancer Daughter   . Heart disease Daughter   . Other Son        drowning  . COPD Son   . Breast cancer Neg Hx     Past Surgical History:  Procedure Laterality Date  . ABDOMINAL HYSTERECTOMY    . BACK SURGERY    . bilateral cataract surgery    . COLONOSCOPY    . LUMBAR LAMINECTOMY  02/20/2012  . LUMBAR LAMINECTOMY  02/20/2012   Procedure: MICRODISCECTOMY LUMBAR LAMINECTOMY;  Surgeon: Eldred Manges, MD;  Location: [facility] OR;  Service: Orthopedics;  Laterality: N/A;  Right L4 hemilaminectomy, microdiscectomy, removal of free fragment   Social History   Occupational History  . Not on file  Tobacco Use  . Smoking status: Never Smoker  . Smokeless tobacco: Never Used  Vaping Use  . Vaping Use: Never used  Substance and Sexual  Activity  . Alcohol use: No  . Drug use: No  . Sexual activity: Not Currently    Birth control/protection: Surgical    Comment: hyst

## 2019-12-19 ENCOUNTER — Ambulatory Visit: Payer: Medicare HMO | Admitting: Family Medicine

## 2019-12-20 ENCOUNTER — Ambulatory Visit: Payer: Medicare HMO | Admitting: Obstetrics & Gynecology

## 2019-12-26 ENCOUNTER — Other Ambulatory Visit: Payer: Self-pay

## 2019-12-26 ENCOUNTER — Encounter: Payer: Self-pay | Admitting: Obstetrics & Gynecology

## 2019-12-26 ENCOUNTER — Ambulatory Visit: Payer: Medicare HMO | Admitting: Obstetrics & Gynecology

## 2019-12-26 VITALS — BP 154/91 | HR 87 | Wt 130.0 lb

## 2019-12-26 DIAGNOSIS — Z4689 Encounter for fitting and adjustment of other specified devices: Secondary | ICD-10-CM | POA: Diagnosis not present

## 2019-12-26 DIAGNOSIS — N819 Female genital prolapse, unspecified: Secondary | ICD-10-CM

## 2019-12-26 NOTE — Progress Notes (Signed)
Chief Complaint  Patient presents with  . Pessary Check    Blood pressure (!) 154/91, pulse 87, weight 130 lb (59 kg).  Valerie Davenport presents today for routine follow up related to her pessary.   She uses a Milex ring with support #5 She reports no vaginal discharge or vaginal bleeding.  Exam reveals no undue vaginal mucosal pressure of breakdown, no discharge and no vaginal bleeding.  The pessary is removed, cleaned and replaced without difficulty.    Valerie Davenport will be sen back in 4 months for continued follow up.  Lazaro Arms, MD  12/26/2019 3:51 PM

## 2019-12-27 ENCOUNTER — Other Ambulatory Visit: Payer: Self-pay | Admitting: Family Medicine

## 2020-02-03 ENCOUNTER — Other Ambulatory Visit: Payer: Medicare HMO

## 2020-02-03 ENCOUNTER — Other Ambulatory Visit: Payer: Self-pay

## 2020-02-03 DIAGNOSIS — Z20822 Contact with and (suspected) exposure to covid-19: Secondary | ICD-10-CM

## 2020-02-07 ENCOUNTER — Telehealth: Payer: Self-pay | Admitting: Family Medicine

## 2020-02-07 LAB — NOVEL CORONAVIRUS, NAA: SARS-CoV-2, NAA: DETECTED — AB

## 2020-02-07 MED ORDER — PREDNISONE 10 MG PO TABS: ORAL_TABLET | ORAL | 0 refills | Status: DC

## 2020-02-07 NOTE — Telephone Encounter (Signed)
I spoke with Valerie Davenport (son): they have no way of checking her O2 sats. She is not short of breath and she has no fever. Her only symptoms are fatigue and cough (nonproductive). She is drinking plenty of water and orange juice and trying to rest, when the cough allows. Valerie Davenport asks if a prednisone pack might help her or is there something else that would help the cough.

## 2020-02-07 NOTE — Telephone Encounter (Signed)
Is she getting oxygen levels checked at home?  Still running fevers?  Feeling short of breath or having chest pain?    Have they given her any treatments so far?

## 2020-02-07 NOTE — Telephone Encounter (Signed)
Please advise 

## 2020-02-07 NOTE — Addendum Note (Signed)
Addended by: Lillia Carmel on: 02/07/2020 01:38 PM   Modules accepted: Orders

## 2020-02-07 NOTE — Telephone Encounter (Signed)
I called and advised the patient's daughter-in-law Mordecai Maes).

## 2020-02-07 NOTE — Telephone Encounter (Signed)
Yes, prednisone might help.  I'll call it in.

## 2020-02-07 NOTE — Telephone Encounter (Signed)
Pt son called and would you like to call him about his mother. She has COVID and she was wondering if Dr. Prince Rome could prescribe he something for her cough? Also it can't be anything liquid. Phone number [phone]

## 2020-03-26 ENCOUNTER — Encounter: Payer: Self-pay | Admitting: Family Medicine

## 2020-03-26 MED ORDER — LEVOTHYROXINE SODIUM 50 MCG PO TABS: 50.0000 ug | ORAL_TABLET | Freq: Every day | ORAL | 1 refills | Status: DC

## 2020-04-10 ENCOUNTER — Other Ambulatory Visit: Payer: Self-pay | Admitting: Family Medicine

## 2020-04-24 ENCOUNTER — Other Ambulatory Visit: Payer: Self-pay

## 2020-04-24 ENCOUNTER — Other Ambulatory Visit: Payer: Self-pay | Admitting: *Deleted

## 2020-04-24 ENCOUNTER — Ambulatory Visit: Payer: Medicare HMO | Admitting: Obstetrics & Gynecology

## 2020-04-24 ENCOUNTER — Encounter: Payer: Self-pay | Admitting: Obstetrics & Gynecology

## 2020-04-24 VITALS — BP 163/88 | HR 85 | Ht 61.0 in | Wt 122.0 lb

## 2020-04-24 DIAGNOSIS — M76892 Other specified enthesopathies of left lower limb, excluding foot: Secondary | ICD-10-CM

## 2020-04-24 DIAGNOSIS — N819 Female genital prolapse, unspecified: Secondary | ICD-10-CM

## 2020-04-24 DIAGNOSIS — Z4689 Encounter for fitting and adjustment of other specified devices: Secondary | ICD-10-CM

## 2020-04-24 NOTE — Progress Notes (Signed)
Chief Complaint  Patient presents with  . Pessary Check    Blood pressure (!) 163/88, pulse 85, height 5\' 1"  (1.549 m), weight 122 lb (55.3 kg).  presents today for routine follow up related to her pessary.   She uses a Milex ring with support #5 She reports no vaginal discharge and no vaginal bleeding   Likert scale(1 not bothersome -5 very bothersome)  :  1  Exam reveals no undue vaginal mucosal pressure of breakdown, no discharge and no vaginal bleeding.  Vaginal Epithelial Abnormality Classification System:   0 0    No abnormalities 1    Epithelial erythema 2    Granulation tissue 3    Epithelial break or erosion, 1 cm or less 4    Epithelial break or erosion, 1 cm or greater  The pessary is removed, cleaned and replaced without difficulty.      ICD-10-CM   1. Pessary maintenance, Milex ring with support #5, placed 01/2018  Z46.89    Chronic + stable  2. Vaginal vault prolapse: managed with the pessary  N81.9   3. Left knee tendonitis, medial, insertion of the semitendinosus/membranosus: new + acute  M76.892    Recommended to get and wear a neoprene compression sleeve for the left knee and will make referral to Dr 02/2018 for evaluation and management     Valerie Davenport will be sen back in 4 months for continued follow up.  [name], MD  04/24/2020 9:46 AM

## 2020-04-25 ENCOUNTER — Encounter: Payer: Self-pay | Admitting: Orthopaedic Surgery

## 2020-05-04 ENCOUNTER — Other Ambulatory Visit (HOSPITAL_COMMUNITY): Payer: Self-pay | Admitting: Family Medicine

## 2020-05-04 DIAGNOSIS — Z1231 Encounter for screening mammogram for malignant neoplasm of breast: Secondary | ICD-10-CM

## 2020-05-10 ENCOUNTER — Ambulatory Visit (HOSPITAL_COMMUNITY)
Admission: RE | Admit: 2020-05-10 | Discharge: 2020-05-10 | Disposition: A | Discharge status: Home / Self Care | Payer: Medicare HMO | Source: Ambulatory Visit

## 2020-05-10 ENCOUNTER — Other Ambulatory Visit: Payer: Self-pay

## 2020-05-10 ENCOUNTER — Telehealth: Payer: Self-pay | Admitting: Family Medicine

## 2020-05-10 ENCOUNTER — Other Ambulatory Visit: Payer: Self-pay | Admitting: Family Medicine

## 2020-05-10 DIAGNOSIS — N6453 Retraction of nipple: Secondary | ICD-10-CM

## 2020-05-10 DIAGNOSIS — Z1231 Encounter for screening mammogram for malignant neoplasm of breast: Secondary | ICD-10-CM

## 2020-05-10 NOTE — Telephone Encounter (Signed)
Please advise 

## 2020-05-10 NOTE — Telephone Encounter (Signed)
Orders placed.

## 2020-05-10 NOTE — Telephone Encounter (Signed)
Patient's grand daughter Wynona Canes called stating that patient was scheduled for a mammogram and they stated they need Dr. Prince Rome to send orders for diagnostic mammogram. Please call grand daughter at [phone].

## 2020-05-10 NOTE — Telephone Encounter (Signed)
Orders for diagnostic mammogram and left breast ultrasound (urgent) have been ordered. The patient's left breast has a dark "spot" near the nipple and that nipple is inverted. Our referral coordinator will be working on this. Son, Terrill Mohr, is to be called with any info. [phone].

## 2020-05-14 ENCOUNTER — Encounter: Payer: Self-pay | Admitting: Radiology

## 2020-05-16 ENCOUNTER — Other Ambulatory Visit: Payer: Self-pay | Admitting: Family Medicine

## 2020-05-18 ENCOUNTER — Ambulatory Visit (INDEPENDENT_AMBULATORY_CARE_PROVIDER_SITE_OTHER): Payer: Medicare HMO | Admitting: Family Medicine

## 2020-05-18 VITALS — BP 121/77 | HR 77 | Ht 61.0 in | Wt 125.8 lb

## 2020-05-18 DIAGNOSIS — N6453 Retraction of nipple: Secondary | ICD-10-CM

## 2020-05-18 DIAGNOSIS — M25562 Pain in left knee: Secondary | ICD-10-CM | POA: Diagnosis not present

## 2020-05-18 MED ORDER — CELECOXIB 100 MG PO CAPS: 100.0000 mg | ORAL_CAPSULE | Freq: Two times a day (BID) | ORAL | 3 refills | Status: DC | PRN

## 2020-05-18 NOTE — Progress Notes (Signed)
Office Visit Note   Patient: Valerie Davenport           Date of Birth: 03-23-1931           MRN: [id number] Visit Date: 05/18/2020 Requested by: Lavada Mesi, MD [address] [place],  [place] [address] PCP: Lavada Mesi, MD  Subjective: Chief Complaint  Patient presents with  . Left Knee - Pain    Pain in posterior knee. She has a knee knee that she wears sometimes.   . Other    Check left breast. She has been scheduled for [place] and diagnostic mammogram, but not until 05/30/20 - (for retracted nipple and a discolored area beside the nipple).    HPI: She is here with 2 concerns.  She went for her annual breast exam not long ago.  She was found to have left-sided nipple retraction.  Mammogram and ultrasound have been ordered but are not scheduled until May 4.  She was concerned that it might need to be addressed sooner.  Her left knee has bothered her for the past couple weeks.  Pain and swelling, no locking but it feels like it is going to give way when she first stands up.  Her knee has bothered her off and on for a long time.               ROS:   All other systems were reviewed and are negative.  Objective: Vital Signs: BP 121/77   Pulse 77   Ht 5\' 1"  (1.549 m)   Wt 125 lb 12.8 oz (57.1 kg)   BMI 23.77 kg/m   Physical Exam:  General:  Alert and oriented, in no acute distress. Pulm:  Breathing unlabored. Psy:  Normal mood, congruent affect. Skin: Female chaperone was present.  Her left nipple has a 1 cm lesion, almost looks like a scab.  There is no drainage, no surrounding erythema. Left knee: 1+ effusion with no warmth.  Full active extension, flexion of 110 degrees.  She has pain at the extreme of flexion.  Possible popliteal cyst, feels small.  Imaging: No results found.  Assessment & Plan: 1.  Left knee DJD -We discussed options, she states that she passed out after getting a knee injection 1 time and would like to avoid that if possible.  We will try Celebrex.   Follow-up as needed.  Voltaren gel as needed as well.  2.  Left breast nipple changes -We will see if we can get her imaging studies done sooner than May 4.     Procedures: No procedures performed        PMFS History: Patient Active Problem List   Diagnosis Date Noted  . Hypothyroid 06/20/2019  . Headache 03/12/2019  . TIA (transient ischemic attack) 03/11/2019  . Essential hypertension 11/10/2018  . HNP (herniated nucleus pulposus), lumbar 02/20/2012    Class: Diagnosis of   Past Medical History:  Diagnosis Date  . Anxiety    but doesn't require meds  . Arthritis   . Chronic back pain    HNP-takes Tramadol and Pain Pill(at night)  . History of blood transfusion    no abnormal reaction noted  . History of migraine    last one about 7yrs ago  . History of shingles   . Hyperlipidemia    takes Simvastatin daily  . Hypertension    takes Enalapril and Chlorthalidone daily  . Hypothyroidism    takes Levothyroxine daily  . Joint pain   . Nocturia  Family History  Problem Relation Age of Onset  . Hypertension Other   . Liver cancer Daughter   . Heart disease Daughter   . Other Son        drowning  . COPD Son   . Breast cancer Neg Hx     Past Surgical History:  Procedure Laterality Date  . ABDOMINAL HYSTERECTOMY    . BACK SURGERY    . bilateral cataract surgery    . COLONOSCOPY    . LUMBAR LAMINECTOMY  02/20/2012  . LUMBAR LAMINECTOMY  02/20/2012   Procedure: MICRODISCECTOMY LUMBAR LAMINECTOMY;  Surgeon: Eldred Manges, MD;  Location: [facility] OR;  Service: Orthopedics;  Laterality: N/A;  Right L4 hemilaminectomy, microdiscectomy, removal of free fragment   Social History   Occupational History  . Not on file  Tobacco Use  . Smoking status: Never Smoker  . Smokeless tobacco: Never Used  Vaping Use  . Vaping Use: Never used  Substance and Sexual Activity  . Alcohol use: No  . Drug use: No  . Sexual activity: Not Currently    Birth control/protection:  Surgical    Comment: hyst

## 2020-05-21 ENCOUNTER — Encounter: Payer: Self-pay | Admitting: Family Medicine

## 2020-05-29 ENCOUNTER — Encounter: Payer: Self-pay | Admitting: Obstetrics & Gynecology

## 2020-05-30 ENCOUNTER — Other Ambulatory Visit: Payer: Self-pay

## 2020-05-30 ENCOUNTER — Ambulatory Visit
Admission: RE | Admit: 2020-05-30 | Discharge: 2020-05-30 | Disposition: A | Discharge status: Home / Self Care | Payer: Medicare HMO | Source: Ambulatory Visit | Attending: Family Medicine | Admitting: Family Medicine

## 2020-05-30 ENCOUNTER — Ambulatory Visit: Payer: Medicare HMO

## 2020-05-30 DIAGNOSIS — R922 Inconclusive mammogram: Secondary | ICD-10-CM | POA: Diagnosis not present

## 2020-05-30 DIAGNOSIS — N6453 Retraction of nipple: Secondary | ICD-10-CM

## 2020-06-08 ENCOUNTER — Other Ambulatory Visit: Payer: Self-pay | Admitting: Family Medicine

## 2020-06-08 MED ORDER — CELECOXIB 100 MG PO CAPS: 100.0000 mg | ORAL_CAPSULE | Freq: Two times a day (BID) | ORAL | 1 refills | Status: DC | PRN

## 2020-07-06 ENCOUNTER — Other Ambulatory Visit: Payer: Self-pay | Admitting: Family Medicine

## 2020-07-06 ENCOUNTER — Encounter: Payer: Self-pay | Admitting: Family Medicine

## 2020-07-06 MED ORDER — SIMVASTATIN 20 MG PO TABS: 20.0000 mg | ORAL_TABLET | Freq: Every day | ORAL | 3 refills | Status: DC

## 2020-07-06 NOTE — Telephone Encounter (Signed)
Please advise 

## 2020-08-17 ENCOUNTER — Other Ambulatory Visit: Payer: Self-pay

## 2020-08-17 ENCOUNTER — Encounter: Payer: Self-pay | Admitting: Obstetrics & Gynecology

## 2020-08-17 ENCOUNTER — Ambulatory Visit: Payer: Medicare HMO | Admitting: Obstetrics & Gynecology

## 2020-08-17 VITALS — BP 133/76 | HR 89 | Ht 61.0 in | Wt 121.0 lb

## 2020-08-17 DIAGNOSIS — N819 Female genital prolapse, unspecified: Secondary | ICD-10-CM

## 2020-08-17 DIAGNOSIS — Z4689 Encounter for fitting and adjustment of other specified devices: Secondary | ICD-10-CM

## 2020-08-17 NOTE — Progress Notes (Signed)
Chief Complaint  Patient presents with   Pessary Check    Blood pressure 133/76, pulse 89, height 5\' 1"  (1.549 m), weight 121 lb (54.9 kg).  presents today for routine follow up related to her pessary.   She uses a Milex ring with support #5 She reports no vaginal discharge and no vaginal bleeding   Likert scale(1 not bothersome -5 very bothersome)  :  1  Exam reveals no undue vaginal mucosal pressure of breakdown, no discharge and no vaginal bleeding.  Vaginal Epithelial Abnormality Classification System:   0 0    No abnormalities 1    Epithelial erythema 2    Granulation tissue 3    Epithelial break or erosion, 1 cm or less 4    Epithelial break or erosion, 1 cm or greater  The pessary is removed, cleaned and replaced without difficulty.    No diagnosis found.   Dioselina A Whittenberg will be sen back in 4 months for continued follow up.  [name], MD  08/17/2020 12:00 PM

## 2020-08-24 ENCOUNTER — Ambulatory Visit: Payer: Medicare HMO | Admitting: Obstetrics & Gynecology

## 2020-09-05 ENCOUNTER — Other Ambulatory Visit: Payer: Self-pay | Admitting: Family Medicine

## 2020-09-22 ENCOUNTER — Other Ambulatory Visit: Payer: Self-pay

## 2020-09-22 ENCOUNTER — Emergency Department (HOSPITAL_COMMUNITY): Payer: Medicare HMO

## 2020-09-22 ENCOUNTER — Encounter (HOSPITAL_COMMUNITY): Payer: Self-pay

## 2020-09-22 ENCOUNTER — Emergency Department (HOSPITAL_COMMUNITY)
Admission: EM | Admit: 2020-09-22 | Discharge: 2020-09-22 | Disposition: A | Discharge status: Home / Self Care | Payer: Medicare HMO | Attending: Emergency Medicine | Admitting: Emergency Medicine

## 2020-09-22 DIAGNOSIS — E039 Hypothyroidism, unspecified: Secondary | ICD-10-CM | POA: Diagnosis not present

## 2020-09-22 DIAGNOSIS — Z79899 Other long term (current) drug therapy: Secondary | ICD-10-CM | POA: Diagnosis not present

## 2020-09-22 DIAGNOSIS — I10 Essential (primary) hypertension: Secondary | ICD-10-CM | POA: Insufficient documentation

## 2020-09-22 DIAGNOSIS — S91111A Laceration without foreign body of right great toe without damage to nail, initial encounter: Secondary | ICD-10-CM | POA: Diagnosis not present

## 2020-09-22 DIAGNOSIS — Z23 Encounter for immunization: Secondary | ICD-10-CM | POA: Insufficient documentation

## 2020-09-22 DIAGNOSIS — W010XXA Fall on same level from slipping, tripping and stumbling without subsequent striking against object, initial encounter: Secondary | ICD-10-CM | POA: Diagnosis not present

## 2020-09-22 DIAGNOSIS — S91119A Laceration without foreign body of unspecified toe without damage to nail, initial encounter: Secondary | ICD-10-CM

## 2020-09-22 DIAGNOSIS — S99922A Unspecified injury of left foot, initial encounter: Secondary | ICD-10-CM | POA: Diagnosis not present

## 2020-09-22 DIAGNOSIS — M7989 Other specified soft tissue disorders: Secondary | ICD-10-CM | POA: Diagnosis not present

## 2020-09-22 MED ORDER — TETANUS-DIPHTH-ACELL PERTUSSIS 5-2.5-18.5 LF-MCG/0.5 IM SUSY
0.5000 mL | PREFILLED_SYRINGE | Freq: Once | INTRAMUSCULAR | Status: AC
  Administered 2020-09-22: 0.5 mL via INTRAMUSCULAR
  Filled 2020-09-22: qty 0.5

## 2020-09-22 MED ORDER — CEPHALEXIN 500 MG PO CAPS
500.0000 mg | ORAL_CAPSULE | Freq: Once | ORAL | Status: AC
  Administered 2020-09-22: 500 mg via ORAL
  Filled 2020-09-22: qty 1

## 2020-09-22 MED ORDER — CEPHALEXIN 500 MG PO CAPS: 500.0000 mg | ORAL_CAPSULE | Freq: Four times a day (QID) | ORAL | 0 refills | Status: AC

## 2020-09-22 NOTE — Discharge Instructions (Addendum)
You were given a prescription for antibiotics. Please take the antibiotic prescription fully.   Clean the wound with soap and water daily   Please follow up with your primary care provider or with Dr. Romeo Apple within 5-7 days for re-evaluation of your symptoms.   Please return to the emergency department for any new or worsening symptoms.

## 2020-09-22 NOTE — ED Provider Notes (Signed)
[facility] EMERGENCY DEPARTMENT Provider Note   CSN: [id number] Arrival date & time: 09/22/20  1748     History Chief Complaint  Patient presents with   Laceration    Valerie Davenport is a 87 y.o. female.  HPI   87 y/o F presenting for eval of right great toe pain that started pta. States she tried to stand up and got her toe caught on the leg of her coffee table. She almost fell to the ground but did not. She is now c/o toe pain and a wound to the right great toe. Pain started suddenly and is constant.   Past Medical History:  Diagnosis Date   Anxiety    but doesn't require meds   Arthritis    Chronic back pain    HNP-takes Tramadol and Pain Pill(at night)   History of blood transfusion    no abnormal reaction noted   History of migraine    last one about 73yrs ago   History of shingles    Hyperlipidemia    takes Simvastatin daily   Hypertension    takes Enalapril and Chlorthalidone daily   Hypothyroidism    takes Levothyroxine daily   Joint pain    Nocturia     Patient Active Problem List   Diagnosis Date Noted   Hypothyroid 06/20/2019   Headache 03/12/2019   TIA (transient ischemic attack) 03/11/2019   Essential hypertension 11/10/2018   HNP (herniated nucleus pulposus), lumbar 02/20/2012    Class: Diagnosis of    Past Surgical History:  Procedure Laterality Date   ABDOMINAL HYSTERECTOMY     BACK SURGERY     bilateral cataract surgery     COLONOSCOPY     LUMBAR LAMINECTOMY  02/20/2012   LUMBAR LAMINECTOMY  02/20/2012   Procedure: MICRODISCECTOMY LUMBAR LAMINECTOMY;  Surgeon: Eldred Manges, MD;  Location: [facility] OR;  Service: Orthopedics;  Laterality: N/A;  Right L4 hemilaminectomy, microdiscectomy, removal of free fragment     OB History     Gravida  6   Para  5   Term  5   Preterm      AB  1   Living         SAB  1   IAB      Ectopic      Multiple      Live Births              Family History  Problem Relation Age of Onset    Hypertension Other    Liver cancer Daughter    Heart disease Daughter    Other Son        drowning   COPD Son    Breast cancer Neg Hx     Social History   Tobacco Use   Smoking status: Never   Smokeless tobacco: Never  Vaping Use   Vaping Use: Never used  Substance Use Topics   Alcohol use: No   Drug use: No    Home Medications Prior to Admission medications   Medication Sig Start Date End Date Taking? Authorizing Provider  celecoxib (CELEBREX) 100 MG capsule Take 1 capsule (100 mg total) by mouth 2 (two) times daily as needed. 06/08/20   Hilts, Casimiro Needle, MD  diazepam (VALIUM) 2 MG tablet TAKE 1/2 TO 1 TABLET(1 TO 2 MG) BY MOUTH TWICE DAILY AS NEEDED FOR ANXIETY 08/12/19   Hilts, Casimiro Needle, MD  diclofenac Sodium (VOLTAREN) 1 % GEL Apply 4 g topically 4 (four) times daily  as needed. 02/03/19   Hilts, Casimiro Needle, MD  enalapril (VASOTEC) 20 MG tablet TAKE 1 TABLET (20 MG TOTAL) DAILY. 09/05/20   Hilts, Casimiro Needle, MD  levothyroxine (SYNTHROID) 50 MCG tablet TAKE 1 TABLET (50 MCG TOTAL) BY MOUTH DAILY BEFORE BREAKFAST. 05/16/20   Hilts, Casimiro Needle, MD  meclizine (ANTIVERT) 25 MG tablet Take 25 mg by mouth 2 (two) times daily as needed. 01/10/19   [provider]  ondansetron (ZOFRAN ODT) 4 MG disintegrating tablet Take 1 tablet (4 mg total) by mouth every 8 (eight) hours as needed for nausea or vomiting. Patient not taking: No sig reported 05/18/19   Burgess Amor, PA-C  predniSONE (DELTASONE) 10 MG tablet Take as directed for 12 days.  Daily dose 6,6,5,5,4,4,3,3,2,2,1,1. Patient not taking: No sig reported 02/07/20   Hilts, Michael, MD  simvastatin (ZOCOR) 20 MG tablet Take 1 tablet (20 mg total) by mouth daily. 07/06/20   Hilts, Casimiro Needle, MD  amLODipine (NORVASC) 5 MG tablet Take 1 tablet (5 mg total) by mouth daily. Patient not taking: Reported on 11/10/2018 10/26/16 01/27/19  Glynn Octave, MD    Allergies    Patient has no known allergies.  Review of Systems   Review of Systems   Constitutional:  Negative for fever.  Musculoskeletal:        Right great toe pain  Skin:  Positive for wound.   Physical Exam Updated Vital Signs BP (!) 160/93 (BP Location: Right Arm)   Pulse 84   Temp 98.1 F (36.7 C) (Oral)   Resp 18   Ht 5\' 1"  (1.549 m)   Wt 55 kg   SpO2 98%   BMI 22.91 kg/m   Physical Exam Vitals and nursing note reviewed.  Constitutional:      General: She is not in acute distress.    Appearance: She is well-developed.  HENT:     Head: Normocephalic and atraumatic.  Eyes:     Conjunctiva/sclera: Conjunctivae normal.  Cardiovascular:     Rate and Rhythm: Normal rate.  Pulmonary:     Effort: Pulmonary effort is normal.  Musculoskeletal:        General: Normal range of motion.     Cervical back: Neck supple.     Comments: 2 laceration on the right great toe at the base of the nail. The nail appears intact   Skin:    General: Skin is warm and dry.  Neurological:     Mental Status: She is alert.    ED Results / Procedures / Treatments   Labs (all labs ordered are listed, but only abnormal results are displayed) Labs Reviewed - No data to display  EKG None  Radiology DG Toe Great Left  Result Date: 09/22/2020 CLINICAL DATA:  Trauma to the left great toe. EXAM: LEFT GREAT TOE COMPARISON:  None. FINDINGS: There is irregularity of the articular base of the distal phalanx of the great toe which may be chronic or represent an acute fracture. Other acute fracture identified. The bones are osteopenic. There is no dislocation. There is erosive changes of the medial aspect of the first metatarsal head. Correlation with history of gout recommended. There is soft tissue swelling of the great toe. No radiopaque foreign object or soft tissue gas. IMPRESSION: Irregularity of the articular base of the distal phalanx of the great toe may be chronic or represent an acute fracture. Electronically Signed   By: 09/24/2020 M.D.   On: 09/22/2020 19:54     Procedures Procedures   Medications  Ordered in ED Medications  Tdap (BOOSTRIX) injection 0.5 mL (0.5 mLs Intramuscular Given 09/22/20 2019)  cephALEXin (KEFLEX) capsule 500 mg (500 mg Oral Given 09/22/20 2018)    ED Course  I have reviewed the triage vital signs and the nursing notes.  Pertinent labs & imaging results that were available during my care of the patient were reviewed by me and considered in my medical decision making (see chart for details).    MDM Rules/Calculators/A&P                          87 y/o female presents to the ed for eval of right great toe injury with laceration   Xray shows Irregularity of the articular base of the distal phalanx of the great toe may be chronic or represent an acute fracture.  Tdap updated. Wound was cleansed and dressing. Wound is nonrepairable.  The toes were buddy taped and patient was placed in a postop shoe.  She was given information of follow-up with Dr. Romeo Apple.  Advised on wound care precautions and advised on strict return precautions.  She and son at bedside voiced understanding the plan and reasons to return.  Questions answered.  Patient stable for discharge.  Final Clinical Impression(s) / ED Diagnoses Final diagnoses:  Laceration of toe of right foot without damage to nail, foreign body presence unspecified, unspecified toe, initial encounter    Rx / DC Orders ED Discharge Orders     None        Rayne Du 09/22/20 2109    Pollyann Savoy, MD 09/22/20 2256

## 2020-09-22 NOTE — ED Triage Notes (Signed)
Pt arrived via POV c/o laceration to left great toe. Pts son brought Pt to ED for evaluation. Pts son reports Pt tripped over leg of her coffee table and cut open her toe. Denies being on blood thinners. Bleeding controlled at this time.

## 2020-09-25 ENCOUNTER — Telehealth: Payer: Self-pay | Admitting: Orthopedic Surgery

## 2020-09-25 NOTE — Telephone Encounter (Signed)
Call/voice message received from one of patient's designated contacts, granddaughter Wynona Canes, ph# [phone], asking about scheduling appointment with Dr Romeo Apple following Emergency room visit at [facility]. Relayed that it appears patient has been scheduled for appointment with [facility]. Said she will talk to family and will also call [place] office to find out about appointment. Aware to call back to our office if we may be of assistance.

## 2020-10-03 ENCOUNTER — Ambulatory Visit (INDEPENDENT_AMBULATORY_CARE_PROVIDER_SITE_OTHER): Payer: Medicare HMO | Admitting: Orthopaedic Surgery

## 2020-10-03 ENCOUNTER — Ambulatory Visit (INDEPENDENT_AMBULATORY_CARE_PROVIDER_SITE_OTHER): Payer: Medicare HMO

## 2020-10-03 ENCOUNTER — Other Ambulatory Visit: Payer: Self-pay

## 2020-10-03 DIAGNOSIS — S91212A Laceration without foreign body of left great toe with damage to nail, initial encounter: Secondary | ICD-10-CM

## 2020-10-03 DIAGNOSIS — M79671 Pain in right foot: Secondary | ICD-10-CM

## 2020-10-03 DIAGNOSIS — S91211A Laceration without foreign body of right great toe with damage to nail, initial encounter: Secondary | ICD-10-CM

## 2020-10-03 MED ORDER — MUPIROCIN 2 % EX OINT: 1.0000 "application " | TOPICAL_OINTMENT | Freq: Two times a day (BID) | CUTANEOUS | 2 refills | Status: DC

## 2020-10-03 MED ORDER — PREDNISONE 10 MG (21) PO TBPK: ORAL_TABLET | ORAL | 0 refills | Status: DC

## 2020-10-03 NOTE — Progress Notes (Signed)
Office Visit Note   Patient: Valerie Davenport           Date of Birth: March 26, 1931           MRN: [id number] Visit Date: 10/03/2020              Requested by: Lavada Mesi, MD No address on file PCP: Lavada Mesi, MD   Assessment & Plan: Visit Diagnoses:  1. Laceration of left great toe with damage to nail, initial encounter   2. Right foot pain     Plan: In terms of the left great toe laceration we will plan to apply mupirocin ointment twice a day and keep her in a postop shoe.  For the right foot impression is contusion from the showerhead.  I do see some calcifications throughout the joint which appear to be chronic.  She can be weight-bear as tolerated to that foot.  We will recheck left great toe laceration in 4 weeks.  Follow-Up Instructions: Return in about 4 weeks (around 10/31/2020).   Orders:  Orders Placed This Encounter  Procedures   XR Foot Complete Right   Meds ordered this encounter  Medications   predniSONE (STERAPRED UNI-PAK 21 TAB) 10 MG (21) TBPK tablet    Sig: Take as directed    Dispense:  21 tablet    Refill:  0   mupirocin ointment (BACTROBAN) 2 %    Sig: Apply 1 application topically 2 (two) times daily.    Dispense:  22 g    Refill:  2      Procedures: No procedures performed   Clinical Data: No additional findings.   Subjective: Chief Complaint  Patient presents with   Right Great Toe - Pain    DOI 09/22/2020    Valerie Davenport is a 87 year old female who comes in for evaluation of a left great toe laceration.  This is a follow-up from the ED.  She is currently ambulating with a postop shoe and taking Tylenol for pain.  She is also complaining of right great toe pain from dropping a showerhead onto it.   Review of Systems  Constitutional: Negative.   HENT: Negative.    Eyes: Negative.   Respiratory: Negative.    Cardiovascular: Negative.   Endocrine: Negative.   Musculoskeletal: Negative.   Neurological: Negative.   Hematological:  Negative.   Psychiatric/Behavioral: Negative.    All other systems reviewed and are negative.   Objective: Vital Signs: There were no vitals taken for this visit.  Physical Exam Vitals and nursing note reviewed.  Constitutional:      Appearance: She is well-developed.  Pulmonary:     Effort: Pulmonary effort is normal.  Skin:    General: Skin is warm.     Capillary Refill: Capillary refill takes less than 2 seconds.  Neurological:     Mental Status: She is alert and oriented to person, place, and time.  Psychiatric:        Behavior: Behavior normal.        Thought Content: Thought content normal.        Judgment: Judgment normal.    Ortho Exam Left great toe shows an injury to the base of the nail.  EHL function intact.  Capillary refill normal. Right foot shows moderate swelling around the great toe MTP joint.  Neurovascular intact.  Tender to palpation. Specialty Comments:  No specialty comments available.  Imaging: XR Foot Complete Right  Result Date: 10/03/2020 No acute abnormalities.  Multiple calcifications around  the MTP joint of the great toe    PMFS History: Patient Active Problem List   Diagnosis Date Noted   Hypothyroid 06/20/2019   Headache 03/12/2019   TIA (transient ischemic attack) 03/11/2019   Essential hypertension 11/10/2018   HNP (herniated nucleus pulposus), lumbar 02/20/2012    Class: Diagnosis of   Past Medical History:  Diagnosis Date   Anxiety    but doesn't require meds   Arthritis    Chronic back pain    HNP-takes Tramadol and Pain Pill(at night)   History of blood transfusion    no abnormal reaction noted   History of migraine    last one about 65yrs ago   History of shingles    Hyperlipidemia    takes Simvastatin daily   Hypertension    takes Enalapril and Chlorthalidone daily   Hypothyroidism    takes Levothyroxine daily   Joint pain    Nocturia     Family History  Problem Relation Age of Onset   Hypertension Other     Liver cancer Daughter    Heart disease Daughter    Other Son        drowning   COPD Son    Breast cancer Neg Hx     Past Surgical History:  Procedure Laterality Date   ABDOMINAL HYSTERECTOMY     BACK SURGERY     bilateral cataract surgery     COLONOSCOPY     LUMBAR LAMINECTOMY  02/20/2012   LUMBAR LAMINECTOMY  02/20/2012   Procedure: MICRODISCECTOMY LUMBAR LAMINECTOMY;  Surgeon: Eldred Manges, MD;  Location: [facility] OR;  Service: Orthopedics;  Laterality: N/A;  Right L4 hemilaminectomy, microdiscectomy, removal of free fragment   Social History   Occupational History   Not on file  Tobacco Use   Smoking status: Never   Smokeless tobacco: Never  Vaping Use   Vaping Use: Never used  Substance and Sexual Activity   Alcohol use: No   Drug use: No   Sexual activity: Not Currently    Birth control/protection: Surgical    Comment: hyst

## 2020-10-30 ENCOUNTER — Telehealth: Payer: Self-pay | Admitting: Family Medicine

## 2020-10-30 NOTE — Telephone Encounter (Signed)
[facility] requesting copy of records. I emailed. 

## 2020-10-31 ENCOUNTER — Ambulatory Visit: Payer: Medicare HMO | Admitting: Orthopaedic Surgery

## 2020-12-10 DIAGNOSIS — R5383 Other fatigue: Secondary | ICD-10-CM | POA: Diagnosis not present

## 2020-12-10 DIAGNOSIS — R42 Dizziness and giddiness: Secondary | ICD-10-CM | POA: Diagnosis not present

## 2020-12-10 DIAGNOSIS — E039 Hypothyroidism, unspecified: Secondary | ICD-10-CM | POA: Diagnosis not present

## 2020-12-10 DIAGNOSIS — E559 Vitamin D deficiency, unspecified: Secondary | ICD-10-CM | POA: Diagnosis not present

## 2020-12-10 DIAGNOSIS — R7982 Elevated C-reactive protein (CRP): Secondary | ICD-10-CM | POA: Diagnosis not present

## 2020-12-10 DIAGNOSIS — I10 Essential (primary) hypertension: Secondary | ICD-10-CM | POA: Diagnosis not present

## 2020-12-10 DIAGNOSIS — Z0000 Encounter for general adult medical examination without abnormal findings: Secondary | ICD-10-CM | POA: Diagnosis not present

## 2020-12-10 DIAGNOSIS — F419 Anxiety disorder, unspecified: Secondary | ICD-10-CM | POA: Diagnosis not present

## 2020-12-10 DIAGNOSIS — F32A Depression, unspecified: Secondary | ICD-10-CM | POA: Diagnosis not present

## 2020-12-18 ENCOUNTER — Other Ambulatory Visit: Payer: Self-pay

## 2020-12-18 ENCOUNTER — Ambulatory Visit: Payer: Medicare HMO | Admitting: Obstetrics & Gynecology

## 2020-12-18 ENCOUNTER — Encounter: Payer: Self-pay | Admitting: Obstetrics & Gynecology

## 2020-12-18 VITALS — BP 145/84 | HR 90 | Ht 61.2 in | Wt 118.0 lb

## 2020-12-18 DIAGNOSIS — N819 Female genital prolapse, unspecified: Secondary | ICD-10-CM

## 2020-12-18 DIAGNOSIS — Z4689 Encounter for fitting and adjustment of other specified devices: Secondary | ICD-10-CM

## 2020-12-18 NOTE — Progress Notes (Signed)
Chief Complaint  Patient presents with   Follow-up    pessary    Blood pressure (!) 145/84, pulse 90, height 5' 1.2" (1.554 m), weight 118 lb (53.5 kg).  Valerie Davenport presents today for routine follow up related to her pessary.   She uses a Milx ring with support #5 She reports no vaginal discharge and no vaginal bleeding   Likert scale(1 not bothersome -5 very bothersome)  :  1  Exam reveals no undue vaginal mucosal pressure of breakdown, no discharge and no vaginal bleeding.  Vaginal Epithelial Abnormality Classification System:   0 0    No abnormalities 1    Epithelial erythema 2    Granulation tissue 3    Epithelial break or erosion, 1 cm or less 4    Epithelial break or erosion, 1 cm or greater  The pessary is removed, cleaned and replaced without difficulty.      ICD-10-CM   1. Pessary maintenance, Milex ring with support #5, placed 01/2018  Z46.89     2. 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  Lazaro Arms, MD  12/18/2020 11:39 AM

## 2021-04-18 ENCOUNTER — Ambulatory Visit: Payer: Medicare HMO | Admitting: Obstetrics & Gynecology

## 2021-04-22 ENCOUNTER — Other Ambulatory Visit: Payer: Self-pay

## 2021-04-22 ENCOUNTER — Telehealth: Payer: Self-pay | Admitting: *Deleted

## 2021-04-22 ENCOUNTER — Ambulatory Visit: Payer: Medicare HMO | Admitting: Obstetrics & Gynecology

## 2021-04-22 ENCOUNTER — Encounter: Payer: Self-pay | Admitting: Obstetrics & Gynecology

## 2021-04-22 VITALS — BP 157/99 | HR 76 | Wt 116.0 lb

## 2021-04-22 DIAGNOSIS — N819 Female genital prolapse, unspecified: Secondary | ICD-10-CM | POA: Diagnosis not present

## 2021-04-22 DIAGNOSIS — Z4689 Encounter for fitting and adjustment of other specified devices: Secondary | ICD-10-CM

## 2021-04-22 NOTE — Progress Notes (Signed)
Chief Complaint  ?Patient presents with  ? Pessary Maintenance  ? ? ?Blood pressure (!) 157/99, pulse 76, weight 116 lb (52.6 kg). ? ?Valerie Davenport presents today for routine follow up related to her pessary.   ?She uses a Milex ring with support #5 ?She reports no vaginal discharge and no vaginal bleeding  ? ?Likert scale(1 not bothersome -5 very bothersome)  :  1 ? ?Exam reveals no undue vaginal mucosal pressure of breakdown, no discharge and no vaginal bleeding. ? ?Vaginal Epithelial Abnormality Classification System:   0 ?0    No abnormalities ?1    Epithelial erythema ?2    Granulation tissue ?3    Epithelial break or erosion, 1 cm or less ?4    Epithelial break or erosion, 1 cm or greater ? ?The pessary is removed, cleaned and replaced without difficulty.   ? ?No diagnosis found.  ? ?[name] will be sen back in 4 months for continued follow up. ? ?Lazaro Arms, MD  ?04/22/2021 ?10:49 AM ? ? ? ?

## 2021-04-22 NOTE — Telephone Encounter (Signed)
Erroneous encounter

## 2021-04-27 ENCOUNTER — Inpatient Hospital Stay (HOSPITAL_COMMUNITY)
Admission: EM | Admit: 2021-04-27 | Discharge: 2021-04-29 | DRG: 480 | Disposition: A | Discharge status: Home Health | Payer: Medicare HMO | Attending: Family Medicine | Admitting: Family Medicine

## 2021-04-27 ENCOUNTER — Emergency Department (HOSPITAL_COMMUNITY): Payer: Medicare HMO

## 2021-04-27 ENCOUNTER — Other Ambulatory Visit: Payer: Self-pay

## 2021-04-27 ENCOUNTER — Encounter (HOSPITAL_COMMUNITY): Payer: Self-pay | Admitting: *Deleted

## 2021-04-27 DIAGNOSIS — R220 Localized swelling, mass and lump, head: Secondary | ICD-10-CM | POA: Diagnosis not present

## 2021-04-27 DIAGNOSIS — F419 Anxiety disorder, unspecified: Secondary | ICD-10-CM | POA: Diagnosis present

## 2021-04-27 DIAGNOSIS — M1712 Unilateral primary osteoarthritis, left knee: Secondary | ICD-10-CM | POA: Diagnosis not present

## 2021-04-27 DIAGNOSIS — S72002D Fracture of unspecified part of neck of left femur, subsequent encounter for closed fracture with routine healing: Secondary | ICD-10-CM | POA: Diagnosis not present

## 2021-04-27 DIAGNOSIS — S72012A Unspecified intracapsular fracture of left femur, initial encounter for closed fracture: Secondary | ICD-10-CM | POA: Diagnosis present

## 2021-04-27 DIAGNOSIS — Z20822 Contact with and (suspected) exposure to covid-19: Secondary | ICD-10-CM | POA: Diagnosis present

## 2021-04-27 DIAGNOSIS — M7989 Other specified soft tissue disorders: Secondary | ICD-10-CM | POA: Diagnosis not present

## 2021-04-27 DIAGNOSIS — R000 Tachycardia, unspecified: Secondary | ICD-10-CM | POA: Diagnosis not present

## 2021-04-27 DIAGNOSIS — M19042 Primary osteoarthritis, left hand: Secondary | ICD-10-CM | POA: Diagnosis not present

## 2021-04-27 DIAGNOSIS — M2578 Osteophyte, vertebrae: Secondary | ICD-10-CM | POA: Diagnosis not present

## 2021-04-27 DIAGNOSIS — W010XXA Fall on same level from slipping, tripping and stumbling without subsequent striking against object, initial encounter: Secondary | ICD-10-CM | POA: Diagnosis present

## 2021-04-27 DIAGNOSIS — M25512 Pain in left shoulder: Secondary | ICD-10-CM | POA: Diagnosis present

## 2021-04-27 DIAGNOSIS — Z79899 Other long term (current) drug therapy: Secondary | ICD-10-CM | POA: Diagnosis not present

## 2021-04-27 DIAGNOSIS — I10 Essential (primary) hypertension: Secondary | ICD-10-CM | POA: Diagnosis present

## 2021-04-27 DIAGNOSIS — Y92009 Unspecified place in unspecified non-institutional (private) residence as the place of occurrence of the external cause: Secondary | ICD-10-CM | POA: Diagnosis not present

## 2021-04-27 DIAGNOSIS — S7292XA Unspecified fracture of left femur, initial encounter for closed fracture: Secondary | ICD-10-CM

## 2021-04-27 DIAGNOSIS — Z8249 Family history of ischemic heart disease and other diseases of the circulatory system: Secondary | ICD-10-CM

## 2021-04-27 DIAGNOSIS — S72009A Fracture of unspecified part of neck of unspecified femur, initial encounter for closed fracture: Secondary | ICD-10-CM

## 2021-04-27 DIAGNOSIS — Z79890 Hormone replacement therapy (postmenopausal): Secondary | ICD-10-CM

## 2021-04-27 DIAGNOSIS — D72829 Elevated white blood cell count, unspecified: Secondary | ICD-10-CM | POA: Diagnosis present

## 2021-04-27 DIAGNOSIS — G9341 Metabolic encephalopathy: Secondary | ICD-10-CM | POA: Diagnosis present

## 2021-04-27 DIAGNOSIS — H9190 Unspecified hearing loss, unspecified ear: Secondary | ICD-10-CM | POA: Diagnosis present

## 2021-04-27 DIAGNOSIS — S72002A Fracture of unspecified part of neck of left femur, initial encounter for closed fracture: Secondary | ICD-10-CM | POA: Diagnosis not present

## 2021-04-27 DIAGNOSIS — E039 Hypothyroidism, unspecified: Secondary | ICD-10-CM | POA: Diagnosis present

## 2021-04-27 DIAGNOSIS — E785 Hyperlipidemia, unspecified: Secondary | ICD-10-CM | POA: Diagnosis present

## 2021-04-27 DIAGNOSIS — Y9301 Activity, walking, marching and hiking: Secondary | ICD-10-CM | POA: Diagnosis present

## 2021-04-27 DIAGNOSIS — W19XXXA Unspecified fall, initial encounter: Principal | ICD-10-CM

## 2021-04-27 DIAGNOSIS — M19072 Primary osteoarthritis, left ankle and foot: Secondary | ICD-10-CM | POA: Diagnosis not present

## 2021-04-27 DIAGNOSIS — M810 Age-related osteoporosis without current pathological fracture: Secondary | ICD-10-CM | POA: Diagnosis not present

## 2021-04-27 DIAGNOSIS — M19032 Primary osteoarthritis, left wrist: Secondary | ICD-10-CM | POA: Diagnosis not present

## 2021-04-27 LAB — RESP PANEL BY RT-PCR (FLU A&B, COVID) ARPGX2
Influenza A by PCR: NEGATIVE
Influenza B by PCR: NEGATIVE
SARS Coronavirus 2 by RT PCR: NEGATIVE

## 2021-04-27 LAB — PROTIME-INR
INR: 1 (ref 0.8–1.2)
Prothrombin Time: 12.8 seconds (ref 11.4–15.2)

## 2021-04-27 LAB — BASIC METABOLIC PANEL
Anion gap: 11 (ref 5–15)
BUN: 25 mg/dL — ABNORMAL HIGH (ref 8–23)
CO2: 23 mmol/L (ref 22–32)
Calcium: 9.3 mg/dL (ref 8.9–10.3)
Chloride: 101 mmol/L (ref 98–111)
Creatinine, Ser: 1.01 mg/dL — ABNORMAL HIGH (ref 0.44–1.00)
GFR, Estimated: 54 mL/min — ABNORMAL LOW (ref >60)
Glucose, Bld: 130 mg/dL — ABNORMAL HIGH (ref 70–99)
Potassium: 3.5 mmol/L (ref 3.5–5.1)
Sodium: 135 mmol/L (ref 135–145)

## 2021-04-27 LAB — CBC
HCT: 42.3 % (ref 36.0–46.0)
Hemoglobin: 13.7 g/dL (ref 12.0–15.0)
MCH: 29.4 pg (ref 26.0–34.0)
MCHC: 32.4 g/dL (ref 30.0–36.0)
MCV: 90.8 fL (ref 80.0–100.0)
Platelets: 207 10*3/uL (ref 150–400)
RBC: 4.66 MIL/uL (ref 3.87–5.11)
RDW: 13.5 % (ref 11.5–15.5)
WBC: 18.3 10*3/uL — ABNORMAL HIGH (ref 4.0–10.5)
nRBC: 0 % (ref 0.0–0.2)

## 2021-04-27 MED ORDER — CEFAZOLIN SODIUM-DEXTROSE 2-4 GM/100ML-% IV SOLN
2.0000 g | INTRAVENOUS | Status: AC
  Filled 2021-04-27: qty 100

## 2021-04-27 MED ORDER — ACETAMINOPHEN 325 MG PO TABS
650.0000 mg | ORAL_TABLET | Freq: Once | ORAL | Status: AC
  Administered 2021-04-27: 650 mg via ORAL
  Filled 2021-04-27: qty 2

## 2021-04-27 MED ORDER — ONDANSETRON HCL 4 MG/2ML IJ SOLN
4.0000 mg | Freq: Once | INTRAMUSCULAR | Status: AC
  Administered 2021-04-27: 4 mg via INTRAVENOUS
  Filled 2021-04-27: qty 2

## 2021-04-27 MED ORDER — TRANEXAMIC ACID-NACL 1000-0.7 MG/100ML-% IV SOLN
1000.0000 mg | INTRAVENOUS | Status: AC
  Administered 2021-04-28: 1000 mg via INTRAVENOUS
  Filled 2021-04-27: qty 100

## 2021-04-27 NOTE — ED Provider Notes (Signed)
?[facility] ?Provider Note ? ? ?CSN: [id number] ?Arrival date & time: 04/27/21  Rickey Primus ? ?  ? ?History ? ?Chief Complaint  ?Patient presents with  ? Fall  ? ? ?Valerie Davenport is a 88 y.o. female. ? ?This is a 88 y.o. female with significant medical history as below, including hard of hearing, hyperlipidemia, hypertension, hypothyroidism who presents to the ED with complaint of fall.  Patient tripped over a rock.  Fell onto her left side.  Apparent mechanical fall. Struck her left shoulder, left hip, left side of her head.  No LOC.  No nausea or vomiting.  No chest pain or dyspnea.  No thinners.  No back pain.  Difficulty ambulating secondary to left leg pain and left arm pain.  No Medications prior to arrival. ? ? ? ?Past Medical History: ?No date: Anxiety ?    Comment:  but doesn't require meds ?No date: Arthritis ?No date: Chronic back pain ?    Comment:  HNP-takes Tramadol and Pain Pill(at night) ?No date: History of blood transfusion ?    Comment:  no abnormal reaction noted ?No date: History of migraine ?    Comment:  last one about 45yrs ago ?No date: History of shingles ?No date: Hyperlipidemia ?    Comment:  takes Simvastatin daily ?No date: Hypertension ?    Comment:  takes Enalapril and Chlorthalidone daily ?No date: Hypothyroidism ?    Comment:  takes Levothyroxine daily ?No date: Joint pain ?No date: Nocturia ? ?Past Surgical History: ?No date: ABDOMINAL HYSTERECTOMY ?No date: BACK SURGERY ?No date: bilateral cataract surgery ?No date: COLONOSCOPY ?02/20/2012: LUMBAR LAMINECTOMY ?02/20/2012: LUMBAR LAMINECTOMY ?    Comment:  Procedure: MICRODISCECTOMY LUMBAR LAMINECTOMY;  Surgeon: ?             Eldred Manges, MD;  Location: [facility] OR;  Service:  ?             Orthopedics;  Laterality: N/A;  Right L4 hemilaminectomy, ?             microdiscectomy, removal of free fragment  ? ? ?The history is provided by the patient and a relative. No language interpreter was used.  ?Fall ?Associated symptoms  include headaches. Pertinent negatives include no chest pain, no abdominal pain and no shortness of breath.  ? ?  ? ?Home Medications ?Prior to Admission medications   ?Medication Sig Start Date End Date Taking? Authorizing Provider  ?celecoxib (CELEBREX) 100 MG capsule Take 1 capsule (100 mg total) by mouth 2 (two) times daily as needed. 06/08/20   Hilts, Casimiro Needle, MD  ?diazepam (VALIUM) 2 MG tablet TAKE 1/2 TO 1 TABLET(1 TO 2 MG) BY MOUTH TWICE DAILY AS NEEDED FOR ANXIETY 08/12/19   Hilts, Casimiro Needle, MD  ?diclofenac Sodium (VOLTAREN) 1 % GEL Apply 4 g topically 4 (four) times daily as needed. 02/03/19   Hilts, Casimiro Needle, MD  ?enalapril (VASOTEC) 20 MG tablet TAKE 1 TABLET (20 MG TOTAL) DAILY. 09/05/20   Hilts, Casimiro Needle, MD  ?levothyroxine (SYNTHROID) 50 MCG tablet TAKE 1 TABLET (50 MCG TOTAL) BY MOUTH DAILY BEFORE BREAKFAST. 05/16/20   Hilts, Casimiro Needle, MD  ?meclizine (ANTIVERT) 25 MG tablet Take 25 mg by mouth 2 (two) times daily as needed. ?Patient not taking: Reported on 04/22/2021 01/10/19   [provider]  ?mupirocin ointment (BACTROBAN) 2 % Apply 1 application topically 2 (two) times daily. 10/03/20   Tarry Kos, MD  ?ondansetron (ZOFRAN ODT) 4 MG disintegrating tablet Take 1  tablet (4 mg total) by mouth every 8 (eight) hours as needed for nausea or vomiting. ?Patient not taking: Reported on 04/22/2021 05/18/19   Burgess AmorIdol, Julie, PA-C  ?predniSONE (DELTASONE) 10 MG tablet Take as directed for 12 days.  Daily dose 6,6,5,5,4,4,3,3,2,2,1,1. ?Patient not taking: Reported on 12/18/2020 02/07/20   Hilts, Casimiro NeedleMichael, MD  ?predniSONE (STERAPRED UNI-PAK 21 TAB) 10 MG (21) TBPK tablet Take as directed ?Patient not taking: Reported on 12/18/2020 10/03/20   Tarry KosXu, Naiping M, MD  ?simvastatin (ZOCOR) 20 MG tablet Take 1 tablet (20 mg total) by mouth daily. 07/06/20   Hilts, Casimiro NeedleMichael, MD  ?amLODipine (NORVASC) 5 MG tablet Take 1 tablet (5 mg total) by mouth daily. ?Patient not taking: Reported on 11/10/2018 10/26/16 01/27/19  Glynn Octaveancour, Stephen,  MD  ?   ? ?Allergies    ?Patient has no known allergies.   ? ?Review of Systems   ?Review of Systems  ?Constitutional:  Negative for chills and fever.  ?HENT:  Negative for facial swelling and trouble swallowing.   ?Eyes:  Negative for photophobia and visual disturbance.  ?Respiratory:  Negative for cough and shortness of breath.   ?Cardiovascular:  Negative for chest pain and palpitations.  ?Gastrointestinal:  Negative for abdominal pain, nausea and vomiting.  ?Endocrine: Negative for polydipsia and polyuria.  ?Genitourinary:  Negative for difficulty urinating and hematuria.  ?Musculoskeletal:  Positive for arthralgias and gait problem. Negative for joint swelling.  ?Skin:  Negative for pallor and rash.  ?Neurological:  Positive for headaches. Negative for syncope.  ?Psychiatric/Behavioral:  Negative for agitation and confusion.   ? ?Physical Exam ?Updated Vital Signs ?BP (!) 146/94   Pulse 92   Temp 98 ?F (36.7 ?C) (Oral)   Resp 16   Ht 4\' 8"  (1.422 m)   Wt 47.6 kg   SpO2 99%   BMI 23.54 kg/m?  ?Physical Exam ?Vitals and nursing note reviewed.  ?Constitutional:   ?   General: She is not in acute distress. ?   Appearance: Normal appearance.  ?HENT:  ?   Head: Normocephalic and atraumatic.  ?   Right Ear: External ear normal.  ?   Left Ear: External ear normal.  ?   Nose: Nose normal.  ?   Mouth/Throat:  ?   Mouth: Mucous membranes are moist.  ?Eyes:  ?   General: No scleral icterus.    ?   Right eye: No discharge.     ?   Left eye: No discharge.  ?   Extraocular Movements: Extraocular movements intact.  ?   Pupils: Pupils are equal, round, and reactive to light.  ?Cardiovascular:  ?   Rate and Rhythm: Normal rate and regular rhythm.  ?   Pulses: Normal pulses.  ?   Heart sounds: Normal heart sounds.  ?Pulmonary:  ?   Effort: Pulmonary effort is normal. No respiratory distress.  ?   Breath sounds: Normal breath sounds.  ?Abdominal:  ?   General: Abdomen is flat.  ?   Tenderness: There is no abdominal  tenderness.  ?Musculoskeletal:     ?   General: Normal range of motion.  ?     Arms: ? ?   Cervical back: Normal range of motion.  ?   Right lower leg: No edema.  ?   Left lower leg: No edema.  ?     Legs: ? ?   Comments: DP pulses equal b/l feet, extremities warm well perfused  ?Skin: ?   General: Skin is warm  and dry.  ?   Capillary Refill: Capillary refill takes less than 2 seconds.  ?Neurological:  ?   Mental Status: She is alert and oriented to person, place, and time.  ?   GCS: GCS eye subscore is 4. GCS verbal subscore is 5. GCS motor subscore is 6.  ?   Cranial Nerves: Cranial nerves 2-12 are intact.  ?   Sensory: Sensation is intact.  ?   Motor: Weakness present.  ?   Coordination: Coordination is intact.  ?   Comments: Weakness left upper extremity secondary to pain ?Reduced range of motion left upper extremity secondary to pain  ?Psychiatric:     ?   Mood and Affect: Mood normal.     ?   Behavior: Behavior normal.  ? ? ?ED Results / Procedures / Treatments   ?Labs ?(all labs ordered are listed, but only abnormal results are displayed) ?Labs Reviewed  ?CBC - Abnormal; Notable for the following components:  ?    Result Value  ? WBC 18.3 (*)   ? All other components within normal limits  ?BASIC METABOLIC PANEL - Abnormal; Notable for the following components:  ? Glucose, Bld 130 (*)   ? BUN 25 (*)   ? Creatinine, Ser 1.01 (*)   ? GFR, Estimated 54 (*)   ? All other components within normal limits  ?RESP PANEL BY RT-PCR (FLU A&B, COVID) ARPGX2  ?PROTIME-INR  ?URINALYSIS, ROUTINE W REFLEX MICROSCOPIC  ?TYPE AND SCREEN  ?ABO/RH  ? ? ?EKG ?EKG Interpretation ? ?Date/Time:  Saturday April 27 2021 18:48:08 EDT ?Ventricular Rate:  100 ?PR Interval:  147 ?QRS Duration: 122 ?QT Interval:  353 ?QTC Calculation: 456 ?R Axis:   92 ?Text Interpretation: Sinus tachycardia Probable left atrial enlargement Right bundle branch block no stemi Confirmed by Tanda Rockers ([id number]) on 04/28/2021 12:10:39 AM ? ?Radiology ?CT Head Wo  Contrast ? ?Result Date: 04/27/2021 ?CLINICAL DATA:  Fall. EXAM: CT HEAD WITHOUT CONTRAST CT CERVICAL SPINE WITHOUT CONTRAST TECHNIQUE: Multidetector CT imaging of the head and cervical spine was performed following the st

## 2021-04-27 NOTE — ED Provider Notes (Signed)
?  Provider Note ?MRN:  [id number]  ?Arrival date & time: 04/27/21    ?ED Course and Medical Decision Making  ?Assumed care from Dr. Wallace Cullens at shift change. ? ?Hip fracture will admit to medicine. ? ?Procedures ? ?Final Clinical Impressions(s) / ED Diagnoses  ? ?  ICD-10-CM   ?1. Fall, initial encounter  [id number].Lorne Skeens   ?  ?2. Closed fracture of left femur, unspecified fracture morphology, unspecified portion of femur, initial encounter (HCC)  S72.92XA   ?  ?  ?ED Discharge Orders   ? ? None  ? ?  ?  ?Discharge Instructions   ?None ?  ? ? ?Elmer Sow. Pilar Plate, MD ?[facility] ?[facility] [email or web address] ? ?  ?Sabas Sous, MD ?04/28/21 [phone] ? ?

## 2021-04-27 NOTE — ED Notes (Signed)
Patient transported to CT 

## 2021-04-27 NOTE — ED Triage Notes (Signed)
Pt fell while walking to neighbors trying to open a gate and foot got caught causing her to fall. Bruising to left temporal, left shoulder pain and left leg. Abrasions to left knee.  Denies blood thinners.  ?

## 2021-04-27 NOTE — ED Notes (Signed)
ED Provider at bedside. 

## 2021-04-27 NOTE — ED Notes (Signed)
Pt returned from Radiology. Family at bedside.  ?

## 2021-04-28 ENCOUNTER — Encounter (HOSPITAL_COMMUNITY): Payer: Self-pay | Admitting: Family Medicine

## 2021-04-28 ENCOUNTER — Encounter (HOSPITAL_COMMUNITY)
Admission: EM | Disposition: A | Discharge status: Home Health | Payer: Self-pay | Source: Home / Self Care | Attending: Family Medicine

## 2021-04-28 ENCOUNTER — Inpatient Hospital Stay (HOSPITAL_COMMUNITY): Payer: Medicare HMO

## 2021-04-28 ENCOUNTER — Inpatient Hospital Stay (HOSPITAL_COMMUNITY): Payer: Medicare HMO | Admitting: Anesthesiology

## 2021-04-28 DIAGNOSIS — Z8249 Family history of ischemic heart disease and other diseases of the circulatory system: Secondary | ICD-10-CM | POA: Diagnosis not present

## 2021-04-28 DIAGNOSIS — F419 Anxiety disorder, unspecified: Secondary | ICD-10-CM | POA: Diagnosis not present

## 2021-04-28 DIAGNOSIS — Y92009 Unspecified place in unspecified non-institutional (private) residence as the place of occurrence of the external cause: Secondary | ICD-10-CM

## 2021-04-28 DIAGNOSIS — E785 Hyperlipidemia, unspecified: Secondary | ICD-10-CM | POA: Diagnosis not present

## 2021-04-28 DIAGNOSIS — Z79899 Other long term (current) drug therapy: Secondary | ICD-10-CM | POA: Diagnosis not present

## 2021-04-28 DIAGNOSIS — S72002D Fracture of unspecified part of neck of left femur, subsequent encounter for closed fracture with routine healing: Secondary | ICD-10-CM | POA: Diagnosis not present

## 2021-04-28 DIAGNOSIS — Z79890 Hormone replacement therapy (postmenopausal): Secondary | ICD-10-CM | POA: Diagnosis not present

## 2021-04-28 DIAGNOSIS — S72002A Fracture of unspecified part of neck of left femur, initial encounter for closed fracture: Secondary | ICD-10-CM | POA: Diagnosis not present

## 2021-04-28 DIAGNOSIS — Y9301 Activity, walking, marching and hiking: Secondary | ICD-10-CM | POA: Diagnosis present

## 2021-04-28 DIAGNOSIS — I10 Essential (primary) hypertension: Secondary | ICD-10-CM | POA: Diagnosis not present

## 2021-04-28 DIAGNOSIS — Z20822 Contact with and (suspected) exposure to covid-19: Secondary | ICD-10-CM | POA: Diagnosis not present

## 2021-04-28 DIAGNOSIS — S72009A Fracture of unspecified part of neck of unspecified femur, initial encounter for closed fracture: Secondary | ICD-10-CM | POA: Diagnosis present

## 2021-04-28 DIAGNOSIS — G9341 Metabolic encephalopathy: Secondary | ICD-10-CM

## 2021-04-28 DIAGNOSIS — W19XXXA Unspecified fall, initial encounter: Secondary | ICD-10-CM | POA: Diagnosis not present

## 2021-04-28 DIAGNOSIS — M25512 Pain in left shoulder: Secondary | ICD-10-CM | POA: Diagnosis present

## 2021-04-28 DIAGNOSIS — E039 Hypothyroidism, unspecified: Secondary | ICD-10-CM | POA: Diagnosis not present

## 2021-04-28 DIAGNOSIS — W010XXA Fall on same level from slipping, tripping and stumbling without subsequent striking against object, initial encounter: Secondary | ICD-10-CM | POA: Diagnosis present

## 2021-04-28 DIAGNOSIS — H9190 Unspecified hearing loss, unspecified ear: Secondary | ICD-10-CM | POA: Diagnosis present

## 2021-04-28 DIAGNOSIS — S72012A Unspecified intracapsular fracture of left femur, initial encounter for closed fracture: Secondary | ICD-10-CM | POA: Diagnosis not present

## 2021-04-28 DIAGNOSIS — D72829 Elevated white blood cell count, unspecified: Secondary | ICD-10-CM | POA: Diagnosis present

## 2021-04-28 HISTORY — PX: HIP PINNING,CANNULATED: SHX1758

## 2021-04-28 LAB — URINALYSIS, ROUTINE W REFLEX MICROSCOPIC
Bilirubin Urine: NEGATIVE
Glucose, UA: NEGATIVE mg/dL
Ketones, ur: NEGATIVE mg/dL
Nitrite: NEGATIVE
Protein, ur: 30 mg/dL — AB
Specific Gravity, Urine: 1.014 (ref 1.005–1.030)
pH: 8 (ref 5.0–8.0)

## 2021-04-28 LAB — MAGNESIUM: Magnesium: 1.6 mg/dL — ABNORMAL LOW (ref 1.7–2.4)

## 2021-04-28 LAB — CBC WITH DIFFERENTIAL/PLATELET
Abs Immature Granulocytes: 0.1 10*3/uL — ABNORMAL HIGH (ref 0.00–0.07)
Basophils Absolute: 0.1 10*3/uL (ref 0.0–0.1)
Basophils Relative: 0 %
Eosinophils Absolute: 0.1 10*3/uL (ref 0.0–0.5)
Eosinophils Relative: 0 %
HCT: 46.6 % — ABNORMAL HIGH (ref 36.0–46.0)
Hemoglobin: 15.3 g/dL — ABNORMAL HIGH (ref 12.0–15.0)
Immature Granulocytes: 1 %
Lymphocytes Relative: 8 %
Lymphs Abs: 1.2 10*3/uL (ref 0.7–4.0)
MCH: 30 pg (ref 26.0–34.0)
MCHC: 32.8 g/dL (ref 30.0–36.0)
MCV: 91.4 fL (ref 80.0–100.0)
Monocytes Absolute: 1.1 10*3/uL — ABNORMAL HIGH (ref 0.1–1.0)
Monocytes Relative: 7 %
Neutro Abs: 12.8 10*3/uL — ABNORMAL HIGH (ref 1.7–7.7)
Neutrophils Relative %: 84 %
Platelets: 221 10*3/uL (ref 150–400)
RBC: 5.1 MIL/uL (ref 3.87–5.11)
RDW: 13.6 % (ref 11.5–15.5)
WBC: 15.2 10*3/uL — ABNORMAL HIGH (ref 4.0–10.5)
nRBC: 0 % (ref 0.0–0.2)

## 2021-04-28 LAB — TYPE AND SCREEN
ABO/RH(D): O POS
Antibody Screen: NEGATIVE

## 2021-04-28 LAB — ABO/RH: ABO/RH(D): O POS

## 2021-04-28 SURGERY — FIXATION, FEMUR, NECK, PERCUTANEOUS, USING SCREW
Anesthesia: General | Site: Hip | Laterality: Left

## 2021-04-28 MED ORDER — ONDANSETRON HCL 4 MG PO TABS: 4.0000 mg | ORAL_TABLET | Freq: Four times a day (QID) | ORAL | Status: DC | PRN

## 2021-04-28 MED ORDER — PROPOFOL 10 MG/ML IV BOLUS
INTRAVENOUS | Status: DC | PRN
  Administered 2021-04-28: 80 mg via INTRAVENOUS

## 2021-04-28 MED ORDER — LEVOTHYROXINE SODIUM 50 MCG PO TABS
50.0000 ug | ORAL_TABLET | Freq: Every day | ORAL | Status: DC
  Administered 2021-04-28 – 2021-04-29 (×2): 50 ug via ORAL
  Filled 2021-04-28 (×2): qty 1

## 2021-04-28 MED ORDER — ONDANSETRON HCL 4 MG/2ML IJ SOLN: 4.0000 mg | Freq: Once | INTRAMUSCULAR | Status: DC | PRN

## 2021-04-28 MED ORDER — SIMVASTATIN 20 MG PO TABS
20.0000 mg | ORAL_TABLET | Freq: Every day | ORAL | Status: DC
Start: 2021-04-28 — End: 2021-04-29
  Administered 2021-04-28: 20 mg via ORAL
  Filled 2021-04-28: qty 1

## 2021-04-28 MED ORDER — MORPHINE SULFATE (PF) 2 MG/ML IV SOLN
2.0000 mg | INTRAVENOUS | Status: DC | PRN
  Administered 2021-04-28: 2 mg via INTRAVENOUS
  Filled 2021-04-28: qty 1

## 2021-04-28 MED ORDER — LACTATED RINGERS IV SOLN: INTRAVENOUS | Status: DC | PRN

## 2021-04-28 MED ORDER — ACETAMINOPHEN 650 MG RE SUPP: 650.0000 mg | Freq: Four times a day (QID) | RECTAL | Status: DC | PRN

## 2021-04-28 MED ORDER — FENTANYL CITRATE (PF) 100 MCG/2ML IJ SOLN
INTRAMUSCULAR | Status: DC | PRN
  Administered 2021-04-28 (×2): 50 ug via INTRAVENOUS

## 2021-04-28 MED ORDER — ONDANSETRON HCL 4 MG/2ML IJ SOLN: 4.0000 mg | Freq: Four times a day (QID) | INTRAMUSCULAR | Status: DC | PRN

## 2021-04-28 MED ORDER — MORPHINE SULFATE (PF) 2 MG/ML IV SOLN
2.0000 mg | Freq: Once | INTRAVENOUS | Status: AC
  Administered 2021-04-28: 2 mg via INTRAVENOUS
  Filled 2021-04-28: qty 1

## 2021-04-28 MED ORDER — ENALAPRIL MALEATE 5 MG PO TABS
20.0000 mg | ORAL_TABLET | Freq: Every day | ORAL | Status: DC
  Filled 2021-04-28: qty 1

## 2021-04-28 MED ORDER — SIMVASTATIN 20 MG PO TABS
20.0000 mg | ORAL_TABLET | Freq: Every day | ORAL | Status: DC
Start: 2021-04-28 — End: 2021-04-28

## 2021-04-28 MED ORDER — BUPIVACAINE HCL (PF) 0.5 % IJ SOLN
INTRAMUSCULAR | Status: DC | PRN
  Administered 2021-04-28: 30 mL

## 2021-04-28 MED ORDER — OXYCODONE HCL 5 MG PO TABS
5.0000 mg | ORAL_TABLET | ORAL | Status: DC | PRN
  Administered 2021-04-29: 5 mg via ORAL
  Filled 2021-04-28: qty 1

## 2021-04-28 MED ORDER — CEFAZOLIN SODIUM-DEXTROSE 2-3 GM-%(50ML) IV SOLR
INTRAVENOUS | Status: DC | PRN
  Administered 2021-04-28: 2 g via INTRAVENOUS

## 2021-04-28 MED ORDER — FENTANYL CITRATE (PF) 100 MCG/2ML IJ SOLN
INTRAMUSCULAR | Status: AC
  Filled 2021-04-28: qty 2

## 2021-04-28 MED ORDER — FENTANYL CITRATE PF 50 MCG/ML IJ SOSY: 25.0000 ug | PREFILLED_SYRINGE | INTRAMUSCULAR | Status: DC | PRN

## 2021-04-28 MED ORDER — LEVOTHYROXINE SODIUM 50 MCG PO TABS: 50.0000 ug | ORAL_TABLET | Freq: Every day | ORAL | Status: DC

## 2021-04-28 MED ORDER — DIAZEPAM 2 MG PO TABS: 2.0000 mg | ORAL_TABLET | Freq: Three times a day (TID) | ORAL | Status: DC | PRN

## 2021-04-28 MED ORDER — ENALAPRIL MALEATE 20 MG PO TABS
20.0000 mg | ORAL_TABLET | Freq: Every day | ORAL | Status: DC
  Administered 2021-04-28 – 2021-04-29 (×2): 20 mg via ORAL
  Filled 2021-04-28 (×4): qty 1

## 2021-04-28 MED ORDER — CEFAZOLIN SODIUM-DEXTROSE 2-4 GM/100ML-% IV SOLN
2.0000 g | Freq: Three times a day (TID) | INTRAVENOUS | Status: AC
  Administered 2021-04-28 – 2021-04-29 (×2): 2 g via INTRAVENOUS
  Filled 2021-04-28 (×2): qty 100

## 2021-04-28 MED ORDER — 0.9 % SODIUM CHLORIDE (POUR BTL) OPTIME
TOPICAL | Status: DC | PRN
  Administered 2021-04-28: 1000 mL

## 2021-04-28 MED ORDER — BUPIVACAINE HCL (PF) 0.5 % IJ SOLN
INTRAMUSCULAR | Status: AC
Start: 2021-04-28 — End: ?
  Filled 2021-04-28: qty 30

## 2021-04-28 MED ORDER — MORPHINE SULFATE (PF) 2 MG/ML IV SOLN: 1.0000 mg | INTRAVENOUS | Status: DC | PRN

## 2021-04-28 MED ORDER — MAGNESIUM SULFATE 4 GM/100ML IV SOLN
4.0000 g | Freq: Once | INTRAVENOUS | Status: AC
  Administered 2021-04-28: 4 g via INTRAVENOUS
  Filled 2021-04-28: qty 100

## 2021-04-28 MED ORDER — ACETAMINOPHEN 325 MG PO TABS
650.0000 mg | ORAL_TABLET | Freq: Four times a day (QID) | ORAL | Status: DC | PRN
Start: 2021-04-28 — End: 2021-04-29

## 2021-04-28 MED ORDER — HYDRALAZINE HCL 20 MG/ML IJ SOLN
10.0000 mg | Freq: Four times a day (QID) | INTRAMUSCULAR | Status: DC | PRN
  Administered 2021-04-28: 10 mg via INTRAVENOUS
  Filled 2021-04-28: qty 1

## 2021-04-28 MED ORDER — PROPOFOL 10 MG/ML IV BOLUS
INTRAVENOUS | Status: AC
  Filled 2021-04-28: qty 20

## 2021-04-28 SURGICAL SUPPLY — 52 items
BAG HAMPER (MISCELLANEOUS) ×2 IMPLANT
BIT DRILL CANN 5.0 (BIT) ×1 IMPLANT
BLADE SURG SZ10 CARB STEEL (BLADE) ×4 IMPLANT
BNDG GAUZE ELAST 4 BULKY (GAUZE/BANDAGES/DRESSINGS) ×2 IMPLANT
CHLORAPREP W/TINT 26 (MISCELLANEOUS) ×2 IMPLANT
CLOTH BEACON ORANGE TIMEOUT ST (SAFETY) ×2 IMPLANT
CLSR STERI-STRIP ANTIMIC 1/2X4 (GAUZE/BANDAGES/DRESSINGS) ×2 IMPLANT
COVER LIGHT HANDLE STERIS (MISCELLANEOUS) ×4 IMPLANT
COVER MAYO STAND XLG (MISCELLANEOUS) IMPLANT
COVER PERINEAL POST (MISCELLANEOUS) ×2 IMPLANT
DECANTER SPIKE VIAL GLASS SM (MISCELLANEOUS) ×4 IMPLANT
DRAPE STERI IOBAN 125X83 (DRAPES) ×2 IMPLANT
DRSG MEPILEX SACRM 8.7X9.8 (GAUZE/BANDAGES/DRESSINGS) ×2 IMPLANT
DRSG TEGADERM 4X4.75 (GAUZE/BANDAGES/DRESSINGS) ×3 IMPLANT
DRSG XEROFORM 1X8 (GAUZE/BANDAGES/DRESSINGS) ×1 IMPLANT
GAUZE SPONGE 4X4 12PLY STRL (GAUZE/BANDAGES/DRESSINGS) ×2 IMPLANT
GAUZE SPONGE 4X4 16PLY XRAY LF (GAUZE/BANDAGES/DRESSINGS) ×1 IMPLANT
GLOVE SRG 8 PF TXTR STRL LF DI (GLOVE) ×1 IMPLANT
GLOVE SURG POLYISO LF SZ8 (GLOVE) ×4 IMPLANT
GLOVE SURG UNDER POLY LF SZ7 (GLOVE) ×4 IMPLANT
GLOVE SURG UNDER POLY LF SZ8 (GLOVE) ×2
GOWN STRL REUS W/ TWL XL LVL3 (GOWN DISPOSABLE) ×1 IMPLANT
GOWN STRL REUS W/TWL LRG LVL3 (GOWN DISPOSABLE) ×4 IMPLANT
GOWN STRL REUS W/TWL XL LVL3 (GOWN DISPOSABLE) ×2
INST SET MAJOR BONE (KITS) ×2 IMPLANT
KIT BLADEGUARD II DBL (SET/KITS/TRAYS/PACK) ×2 IMPLANT
KIT TURNOVER CYSTO (KITS) ×2 IMPLANT
MANIFOLD NEPTUNE II (INSTRUMENTS) ×2 IMPLANT
MARKER SKIN DUAL TIP RULER LAB (MISCELLANEOUS) ×2 IMPLANT
NDL HYPO 21X1.5 SAFETY (NEEDLE) ×1 IMPLANT
NEEDLE HYPO 21X1.5 SAFETY (NEEDLE) ×2 IMPLANT
NS IRRIG 1000ML POUR BTL (IV SOLUTION) ×2 IMPLANT
PACK BASIC III (CUSTOM PROCEDURE TRAY) ×2
PACK SRG BSC III STRL LF ECLPS (CUSTOM PROCEDURE TRAY) ×1 IMPLANT
PAD ABD 5X9 TENDERSORB (GAUZE/BANDAGES/DRESSINGS) ×2 IMPLANT
PENCIL SMOKE EVACUATOR COATED (MISCELLANEOUS) ×2 IMPLANT
PIN GUIDE DRILL TIP 2.8X300 (DRILL) IMPLANT
PIN GUIDE THRD AR 3.2X330 (PIN) ×2 IMPLANT
SCREW CANN 70X20X7 NS (Screw) IMPLANT
SCREW CANN 7X80 (Screw) ×1 IMPLANT
SCREW CANN THRD 7X65 (Screw) ×1 IMPLANT
SCREW CANN THRD 7X70 (Screw) ×2 IMPLANT
SET BASIN LINEN APH (SET/KITS/TRAYS/PACK) ×2 IMPLANT
SPONGE T-LAP 18X18 ~~LOC~~+RFID (SPONGE) ×4 IMPLANT
SUT MNCRL AB 4-0 PS2 18 (SUTURE) ×2 IMPLANT
SUT MON AB 2-0 CT1 36 (SUTURE) ×2 IMPLANT
SUT VIC AB 0 CT1 27 (SUTURE) ×2
SUT VIC AB 0 CT1 27XBRD ANTBC (SUTURE) ×1 IMPLANT
SYR 30ML LL (SYRINGE) ×2 IMPLANT
SYR BULB IRRIG 60ML STRL (SYRINGE) ×4 IMPLANT
WASHER FLAT 7.0 (Washer) ×2 IMPLANT
YANKAUER SUCT BULB TIP 10FT TU (MISCELLANEOUS) ×2 IMPLANT

## 2021-04-28 NOTE — Progress Notes (Signed)
ASSUMPTION OF CARE NOTE  ? ?04/28/2021 ?3:09 PM ? ?Valerie Davenport was seen and examined.  The H&P by the admitting provider, orders, imaging was reviewed.  Please see new orders.  Will continue to follow.  ? ? ?88 y.o. female with medical history significant of with history of anxiety, arthritis, hyperlipidemia, hypertension, hypothyroidism, and more presents the ED with a chief complaint of fall.  Unfortunately I am seeing patient after she had morphine, and she is not answering questions appropriately.  For example she asked me if I needed something to sleep in, and told me that she likes what I brought her to sleep and for her birthday.  Granddaughter at bedside reports that patient was trying to open a gate on loose gravel when she slipped on the gravel and fell.  She landed on her left side, hit her head.  The fall was not witnessed, but they do not think she lost consciousness because she took her cane and started hitting the neighbors door to get the neighbor to come out and help her.  Patient does live independently.  She takes care of self but she no longer drives.  She ambulates with either a cane or walker.  She is able to hold a normal conversation recognize people around her.  At the time of my exam she is not recognizing her granddaughter, but she does know that the doctors keep waking her up.  Granddaughter reports the patient has been in her normal state of health until this fall. ?  ?Patient does not smoke, does not drink.  She is vaccinated for COVID.  Granddaughter thinks patient would prefer to be full code. ? ?04/28/2021:  POD#0 s/p CRPP of the Left femoral neck fracture.   ? ? ?Vitals:  ? 04/28/21 1301 04/28/21 1407  ?BP: (!) 180/129 138/79  ?Pulse: (!) 105   ?Resp: 16   ?Temp: 98.1 ?F (36.7 ?C)   ?SpO2: 96%   ? ? ?Results for orders placed or performed during the hospital encounter of 04/27/21  ?Resp Panel by RT-PCR (Flu A&B, Covid) Nasopharyngeal Swab  ? Specimen: Nasopharyngeal Swab;  Nasopharyngeal(NP) swabs in vial transport medium  ?Result Value Ref Range  ? SARS Coronavirus 2 by RT PCR NEGATIVE NEGATIVE  ? Influenza A by PCR NEGATIVE NEGATIVE  ? Influenza B by PCR NEGATIVE NEGATIVE  ?CBC  ?Result Value Ref Range  ? WBC 18.3 (H) 4.0 - 10.5 K/uL  ? RBC 4.66 3.87 - 5.11 MIL/uL  ? Hemoglobin 13.7 12.0 - 15.0 g/dL  ? HCT 42.3 36.0 - 46.0 %  ? MCV 90.8 80.0 - 100.0 fL  ? MCH 29.4 26.0 - 34.0 pg  ? MCHC 32.4 30.0 - 36.0 g/dL  ? RDW 13.5 11.5 - 15.5 %  ? Platelets 207 150 - 400 K/uL  ? nRBC 0.0 0.0 - 0.2 %  ?Basic metabolic panel  ?Result Value Ref Range  ? Sodium 135 135 - 145 mmol/L  ? Potassium 3.5 3.5 - 5.1 mmol/L  ? Chloride 101 98 - 111 mmol/L  ? CO2 23 22 - 32 mmol/L  ? Glucose, Bld 130 (H) 70 - 99 mg/dL  ? BUN 25 (H) 8 - 23 mg/dL  ? Creatinine, Ser 1.01 (H) 0.44 - 1.00 mg/dL  ? Calcium 9.3 8.9 - 10.3 mg/dL  ? GFR, Estimated 54 (L) >60 mL/min  ? Anion gap 11 5 - 15  ?Protime-INR  ?Result Value Ref Range  ? Prothrombin Time 12.8 11.4 - 15.2 seconds  ?  INR 1.0 0.8 - 1.2  ?Urinalysis, Routine w reflex microscopic Urine, Clean Catch  ?Result Value Ref Range  ? Color, Urine YELLOW YELLOW  ? APPearance HAZY (A) CLEAR  ? Specific Gravity, Urine 1.014 1.005 - 1.030  ? pH 8.0 5.0 - 8.0  ? Glucose, UA NEGATIVE NEGATIVE mg/dL  ? Hgb urine dipstick SMALL (A) NEGATIVE  ? Bilirubin Urine NEGATIVE NEGATIVE  ? Ketones, ur NEGATIVE NEGATIVE mg/dL  ? Protein, ur 30 (A) NEGATIVE mg/dL  ? Nitrite NEGATIVE NEGATIVE  ? Leukocytes,Ua MODERATE (A) NEGATIVE  ? RBC / HPF 6-10 0 - 5 RBC/hpf  ? WBC, UA 11-20 0 - 5 WBC/hpf  ? Bacteria, UA RARE (A) NONE SEEN  ? Squamous Epithelial / LPF 0-5 0 - 5  ?Magnesium  ?Result Value Ref Range  ? Magnesium 1.6 (L) 1.7 - 2.4 mg/dL  ?CBC WITH DIFFERENTIAL  ?Result Value Ref Range  ? WBC 15.2 (H) 4.0 - 10.5 K/uL  ? RBC 5.10 3.87 - 5.11 MIL/uL  ? Hemoglobin 15.3 (H) 12.0 - 15.0 g/dL  ? HCT 46.6 (H) 36.0 - 46.0 %  ? MCV 91.4 80.0 - 100.0 fL  ? MCH 30.0 26.0 - 34.0 pg  ? MCHC 32.8 30.0 -  36.0 g/dL  ? RDW 13.6 11.5 - 15.5 %  ? Platelets 221 150 - 400 K/uL  ? nRBC 0.0 0.0 - 0.2 %  ? Neutrophils Relative % 84 %  ? Neutro Abs 12.8 (H) 1.7 - 7.7 K/uL  ? Lymphocytes Relative 8 %  ? Lymphs Abs 1.2 0.7 - 4.0 K/uL  ? Monocytes Relative 7 %  ? Monocytes Absolute 1.1 (H) 0.1 - 1.0 K/uL  ? Eosinophils Relative 0 %  ? Eosinophils Absolute 0.1 0.0 - 0.5 K/uL  ? Basophils Relative 0 %  ? Basophils Absolute 0.1 0.0 - 0.1 K/uL  ? Immature Granulocytes 1 %  ? Abs Immature Granulocytes 0.10 (H) 0.00 - 0.07 K/uL  ?Type and screen [facility]  ?Result Value Ref Range  ? ABO/RH(D) O POS   ? Antibody Screen NEG   ? Sample Expiration    ?  04/30/2021,2359 ?Performed at [facility], [address], [place], [place] [address] ?  ?ABO/Rh  ?Result Value Ref Range  ? ABO/RH(D)    ?  O POS ?Performed at [facility], [address], [place], [place] [address] ?  ? ? ?C. Laural Benes, MD ?[facility] ? ? 04/27/2021  6:40 PM ?How to contact the [facility] Attending or Consulting provider 7A - 7P or covering provider during after hours 7P -7A, for this patient?  ?Check the care team in [facility], [facility], [facility] and look for a) attending/consulting [facility] provider listed and b) the [facility] team listed ?Log into [email or web address] and use [facility]'s universal password to access. If you do not have the password, please contact the hospital operator. ?Locate the [facility] provider you are looking for under [facility] and page to a number that you can be directly reached. ?If you still have difficulty reaching the provider, please page the [facility] (Director on Call) for the Hospitalists listed on amion for assistance. ? ?

## 2021-04-28 NOTE — Op Note (Signed)
Orthopaedic Surgery Operative Note (CSN: [id number]) ? ?Valerie Davenport  1931/10/15 ?Date of Surgery: 04/28/2021 ? ? ?Diagnoses:  ?Nondisplaced left femoral neck fracture  ? ?Procedure: ?CRPP of the Left femoral neck fracture ?  ?Operative Finding ?Successful completion of the planned procedure.  Placement of 3 x 7.0 mm cannulated screws with washers (x2) to stabilize the Non displaced femoral neck fracture ? ? ?Post-Op Diagnosis: Same ?Surgeons:Primary: Oliver Barre, MD ?Assistants:  N/A ?Location: [facility] OR ROOM 4 ?Anesthesia: General with local anesthesia ?Antibiotics: Ancef 2 g ?Tourniquet time: N/A ?Estimated Blood Loss: 50 ?Complications: None ?Specimens: None ?Implants: ?Implant Name Type Inv. Item Serial No. Manufacturer Lot No. LRB No. Used Action  ?SCREW CANN 7X80 - [id number] Screw SCREW CANN 7X80  [employer]  Left 1 Implanted  ?WASHER FLAT 7.0 - [id number] Washer WASHER FLAT 7.0  [employer]  Left 1 Implanted  ?7.0 x 70 screw      Left 1 Implanted  ?7.0 x 65 screw      Left 1 Implanted  ? ? ?Indications for Surgery:   ?Valerie Davenport is a 88 y.o. female who sustained a mechanical fall while walking yesterday.  She complained of left hip pain.  In the ED, radiographs demonstrated a nondisplaced fracture of the left femoral neck.  In order to restore form and function, I recommended operative stabilization.  Benefits and risks of operative and nonoperative management were discussed prior to surgery with patient's family and informed consent form was completed.  Specific risks including infection, need for additional surgery, avascular necrosis, nonunion, malunion, bleeding and more severe risks associated with anesthesia.  They elected to proceed with surgery.  ? ? ?Procedure:   ?The patient was identified properly. Informed consent was obtained and the surgical site was marked. The patient was taken up to suite where general anesthesia was induced.  The patient was positioned supine on a fracture table.  The left  leg was prepped and draped in the usual sterile fashion.  Timeout was performed before the beginning of the case.  Patient received 2 g Ancef and 1 g TXA prior to making incision.  ? ?We started by making a lateral incision over the hip, in line with the starting point for the screws.  We incised sharply through the IT band to expose the lateral border of the femur.  Under fluoroscopic guidance, we placed the first guide wire along the inferior aspect of the femoral neck.  We ensured that the starting point was superior to the lesser trochanter so as to reduce the risk of a stress riser.  Orthogonal images confirmed the placement of the first guidewire to be inferior and posterior within the femoral neck.  We then placed the targeting device and placed 2 additional guidewires in the superior and anterior and then superior and posterior femoral neck. We used fluoroscopy we were satisfied with the positioning of the guidewires.  Using the measuring device, we determined the length of screw needed.  We then proceeded to drill the lateral cortex.  All 3 screws with a washer (x 2) were then inserted under fluoroscopic guidance.  Orthogonal imaging confirmed the location of the screws of sufficient length and the washers were flush with the lateral cortex.  ? ?We irrigated the wound copiously and then closed the incision in a multilayer fashion with absorbable suture.  Sterile dressing was placed.  Patient was awoken taken to PACU in stable condition. ? ? ?Post-operative plan:  ?The patient will be  admitted to the floor.   ?Weightbearing status:  WBAT LLE ?Dressing to remain in place until POD#2/3 ?DVT prophylaxis per primary team, no orthopedic contraindications.    ?Pain control with PRN pain medication preferring oral medicines.   ?Follow up plan will be scheduled in approximately 10-14 days days for incision check and XR of the Left hip. ? ?

## 2021-04-28 NOTE — Consult Note (Addendum)
ORTHOPAEDIC CONSULTATION  REQUESTING PHYSICIAN: Cleora Fleet, MD  ASSESSMENT AND PLAN: 88 y.o. female with the following: Left Hip Non displaced femoral neck fracture  This patient requires inpatient admission to the hospitalist, to include preoperative clearance and perioperative medical management  - Weight Bearing Status/Activity: NWB Left lower extremity  - Additional recommended labs/tests: Preop Labs: CBC, BMP, PT/INR, Chest XR, and EKG  -VTE Prophylaxis: Please hold prior to OR; to resume POD#1 at the discretion of the primary team  - Pain control: Recommend PO pain medications PRN; judicious use of narcotics  - Follow-up plan: F/u 10-14 days postop  -Procedures: Plan for OR once patient has been medically optimized; scheduled for 04/28/21  Plan for Left Hip CRPP of femoral neck fracture     Chief Complaint: Left hip pain  HPI: Valerie Davenport is a 88 y.o. female who presented to the ED for evaluation after sustaining a mechanical fall.  She uses a walker at baseline.  Patient is hard of hearing, so most of this information has been gleaned from the chart and in discussion with family at the bedside.  She walks regularly.  Yesterday, she was walking up the street to see a friend when she stumbled and fell.  She has pain when she tries to move her left hip.  She is also complaining of pain in her left shoulder.  No additional injuries noted on available imaging.  She did hit her head.  She is not currently on a blood thinner.   Past Medical History:  Diagnosis Date   Anxiety    but doesn't require meds   Arthritis    Chronic back pain    HNP-takes Tramadol and Pain Pill(at night)   History of blood transfusion    no abnormal reaction noted   History of migraine    last one about 34yrs ago   History of shingles    Hyperlipidemia    takes Simvastatin daily   Hypertension    takes Enalapril and Chlorthalidone daily   Hypothyroidism    takes Levothyroxine daily    Joint pain    Nocturia    Past Surgical History:  Procedure Laterality Date   ABDOMINAL HYSTERECTOMY     BACK SURGERY     bilateral cataract surgery     COLONOSCOPY     LUMBAR LAMINECTOMY  02/20/2012   LUMBAR LAMINECTOMY  02/20/2012   Procedure: MICRODISCECTOMY LUMBAR LAMINECTOMY;  Surgeon: Eldred Manges, MD;  Location: [facility] OR;  Service: Orthopedics;  Laterality: N/A;  Right L4 hemilaminectomy, microdiscectomy, removal of free fragment   Social History   Socioeconomic History   Marital status: Widowed    Spouse name: Not on file   Number of children: Not on file   Years of education: Not on file   Highest education level: Not on file  Occupational History   Not on file  Tobacco Use   Smoking status: Never   Smokeless tobacco: Never  Vaping Use   Vaping Use: Never used  Substance and Sexual Activity   Alcohol use: No   Drug use: No   Sexual activity: Not Currently    Birth control/protection: Surgical    Comment: hyst  Other Topics Concern   Not on file  Social History Narrative   Not on file   Social Determinants of Health   Financial Resource Strain: Not on file  Food Insecurity: Not on file  Transportation Needs: Not on file  Physical Activity: Not on file  Stress: Not on file  Social Connections: Not on file   Family History  Problem Relation Age of Onset   Hypertension Other    Liver cancer Daughter    Heart disease Daughter    Other Son        drowning   COPD Son    Breast cancer Neg Hx    No Known Allergies Prior to Admission medications   Medication Sig Start Date End Date Taking? Authorizing Provider  celecoxib (CELEBREX) 100 MG capsule Take 1 capsule (100 mg total) by mouth 2 (two) times daily as needed. 06/08/20   Hilts, Casimiro Needle, MD  diazepam (VALIUM) 2 MG tablet TAKE 1/2 TO 1 TABLET(1 TO 2 MG) BY MOUTH TWICE DAILY AS NEEDED FOR ANXIETY 08/12/19   Hilts, Casimiro Needle, MD  diclofenac Sodium (VOLTAREN) 1 % GEL Apply 4 g topically 4 (four) times daily as  needed. 02/03/19   Hilts, Casimiro Needle, MD  enalapril (VASOTEC) 20 MG tablet TAKE 1 TABLET (20 MG TOTAL) DAILY. 09/05/20   Hilts, Casimiro Needle, MD  levothyroxine (SYNTHROID) 50 MCG tablet TAKE 1 TABLET (50 MCG TOTAL) BY MOUTH DAILY BEFORE BREAKFAST. 05/16/20   Hilts, Casimiro Needle, MD  meclizine (ANTIVERT) 25 MG tablet Take 25 mg by mouth 2 (two) times daily as needed. Patient not taking: Reported on 04/22/2021 01/10/19   [provider]  mupirocin ointment (BACTROBAN) 2 % Apply 1 application topically 2 (two) times daily. 10/03/20   Tarry Kos, MD  ondansetron (ZOFRAN ODT) 4 MG disintegrating tablet Take 1 tablet (4 mg total) by mouth every 8 (eight) hours as needed for nausea or vomiting. Patient not taking: Reported on 04/22/2021 05/18/19   Burgess Amor, PA-C  predniSONE (DELTASONE) 10 MG tablet Take as directed for 12 days.  Daily dose 6,6,5,5,4,4,3,3,2,2,1,1. Patient not taking: Reported on 12/18/2020 02/07/20   Hilts, Casimiro Needle, MD  predniSONE (STERAPRED UNI-PAK 21 TAB) 10 MG (21) TBPK tablet Take as directed Patient not taking: Reported on 12/18/2020 10/03/20   Tarry Kos, MD  simvastatin (ZOCOR) 20 MG tablet Take 1 tablet (20 mg total) by mouth daily. 07/06/20   Hilts, Casimiro Needle, MD  amLODipine (NORVASC) 5 MG tablet Take 1 tablet (5 mg total) by mouth daily. Patient not taking: Reported on 11/10/2018 10/26/16 01/27/19  Glynn Octave, MD   CT Head Wo Contrast  Result Date: 04/27/2021 CLINICAL DATA:  Fall. EXAM: CT HEAD WITHOUT CONTRAST CT CERVICAL SPINE WITHOUT CONTRAST TECHNIQUE: Multidetector CT imaging of the head and cervical spine was performed following the standard protocol without intravenous contrast. Multiplanar CT image reconstructions of the cervical spine were also generated. RADIATION DOSE REDUCTION: This exam was performed according to the departmental dose-optimization program which includes automated exposure control, adjustment of the mA and/or kV according to patient size and/or use of  iterative reconstruction technique. COMPARISON:  CT head 05/17/2019.  CT cervical spine 12/07/2013. FINDINGS: CT HEAD FINDINGS Brain: No evidence of acute infarction, hemorrhage, hydrocephalus, extra-axial collection or mass lesion/mass effect. Again seen is mild diffuse atrophy and mild periventricular white matter hypodensity, likely chronic small vessel ischemic change, mildly increased from prior. Vascular: Atherosclerotic calcifications are present within the cavernous internal carotid arteries. Skull: Normal. Negative for fracture or focal lesion. Sinuses/Orbits: No acute finding. Other: There is left frontal scalp soft tissue swelling. CT CERVICAL SPINE FINDINGS Alignment: Normal. Skull base and vertebrae: Bones are osteopenic. There is no evidence for acute fracture or dislocation. There is callus formation surrounding the spinous process in C7 likely related to healed fracture.  This is unchanged from prior. Soft tissues and spinal canal: No prevertebral fluid or swelling. No visible canal hematoma. Disc levels: There is disc space narrowing and endplate osteophyte formation throughout the cervical spine which appears similar to the prior study. There is no severe central canal or neural foraminal stenosis at any level. There stable mild central canal stenosis at C5-C6 and C6-C7 secondary to posterior disc osteophyte complexes. Upper chest: Negative. Other: None. IMPRESSION: 1.  No acute intracranial process. 2. No acute fracture or traumatic subluxation of the cervical spine. 3. Mild diffuse atrophy and mild chronic small vessel ischemic changes of the brain have slightly increased from prior. 4.  Stable multilevel degenerative changes of the cervical spine. Electronically Signed   By: Darliss Cheney M.D.   On: 04/27/2021 22:27   CT Cervical Spine Wo Contrast  Result Date: 04/27/2021 CLINICAL DATA:  Fall. EXAM: CT HEAD WITHOUT CONTRAST CT CERVICAL SPINE WITHOUT CONTRAST TECHNIQUE: Multidetector CT imaging  of the head and cervical spine was performed following the standard protocol without intravenous contrast. Multiplanar CT image reconstructions of the cervical spine were also generated. RADIATION DOSE REDUCTION: This exam was performed according to the departmental dose-optimization program which includes automated exposure control, adjustment of the mA and/or kV according to patient size and/or use of iterative reconstruction technique. COMPARISON:  CT head 05/17/2019.  CT cervical spine 12/07/2013. FINDINGS: CT HEAD FINDINGS Brain: No evidence of acute infarction, hemorrhage, hydrocephalus, extra-axial collection or mass lesion/mass effect. Again seen is mild diffuse atrophy and mild periventricular white matter hypodensity, likely chronic small vessel ischemic change, mildly increased from prior. Vascular: Atherosclerotic calcifications are present within the cavernous internal carotid arteries. Skull: Normal. Negative for fracture or focal lesion. Sinuses/Orbits: No acute finding. Other: There is left frontal scalp soft tissue swelling. CT CERVICAL SPINE FINDINGS Alignment: Normal. Skull base and vertebrae: Bones are osteopenic. There is no evidence for acute fracture or dislocation. There is callus formation surrounding the spinous process in C7 likely related to healed fracture. This is unchanged from prior. Soft tissues and spinal canal: No prevertebral fluid or swelling. No visible canal hematoma. Disc levels: There is disc space narrowing and endplate osteophyte formation throughout the cervical spine which appears similar to the prior study. There is no severe central canal or neural foraminal stenosis at any level. There stable mild central canal stenosis at C5-C6 and C6-C7 secondary to posterior disc osteophyte complexes. Upper chest: Negative. Other: None. IMPRESSION: 1.  No acute intracranial process. 2. No acute fracture or traumatic subluxation of the cervical spine. 3. Mild diffuse atrophy and mild  chronic small vessel ischemic changes of the brain have slightly increased from prior. 4.  Stable multilevel degenerative changes of the cervical spine. Electronically Signed   By: Darliss Cheney M.D.   On: 04/27/2021 22:27   DG Shoulder Left  Result Date: 04/27/2021 CLINICAL DATA:  Pain after fall. EXAM: LEFT SHOULDER - 2+ VIEW COMPARISON:  None. FINDINGS: Soft tissue calcification adjacent to the lateral humeral head, likely calcific tendinosis. No fractures identified. No dislocation identified. Soft tissue calcifications projected just superior to the humeral head on the lateral view. These calcifications have a chronic appearance. AC joint degenerative changes. No other acute abnormalities. IMPRESSION: No acute fracture or dislocation. Calcific tendinosis. Soft tissue calcifications projected over in just superior to the humeral head have a chronic appearance, possibly loose bodies. Electronically Signed   By: Gerome Sam III M.D.   On: 04/27/2021 19:22   DG Knee Complete  4 Views Left  Result Date: 04/27/2021 CLINICAL DATA:  Pain after fall EXAM: LEFT KNEE - COMPLETE 4+ VIEW COMPARISON:  None. FINDINGS: Chondrocalcinosis in the medial and lateral compartments. Tricompartmental degenerative changes. No joint effusion. No fracture. Enthesopathic changes off the anterior superior patella. Vascular calcifications are noted. IMPRESSION: 1. Chondrocalcinosis consistent with CPPD. Tricompartmental degenerative changes. No acute abnormalities. Electronically Signed   By: Gerome Sam III M.D.   On: 04/27/2021 19:23   DG Hand Complete Left  Result Date: 04/27/2021 CLINICAL DATA:  Pain after a fall. EXAM: LEFT HAND - COMPLETE 3+ VIEW COMPARISON:  None. FINDINGS: Degenerative changes in the interphalangeal, metacarpal phalangeal, and intercarpal joints. Prominent cartilaginous calcification in the radiocarpal joint, STT joint, second metacarpal phalangeal joint, and triangular fibrocartilage. No evidence of  acute fracture or dislocation. No focal bone lesion or bone destruction. IMPRESSION: Prominent degenerative changes in the left hand and wrist. No acute fractures identified. Electronically Signed   By: Burman Nieves M.D.   On: 04/27/2021 21:32   DG Foot Complete Left  Result Date: 04/27/2021 CLINICAL DATA:  Pain after a fall. EXAM: LEFT FOOT - COMPLETE 3+ VIEW COMPARISON:  None. FINDINGS: Diffuse bone demineralization. Degenerative changes throughout the left foot. No evidence of acute fracture or dislocation. No focal bone lesions. Small plantar calcaneal spur with calcification in the plantar fascia, suggesting fasciitis. Vascular calcifications. IMPRESSION: Degenerative changes throughout the left foot. No acute fractures identified. Plantar fascial calcifications suggesting fasciitis. Electronically Signed   By: Burman Nieves M.D.   On: 04/27/2021 21:34   DG Hip Unilat W or Wo Pelvis 2-3 Views Left  Result Date: 04/27/2021 CLINICAL DATA:  Pain. EXAM: DG HIP (WITH OR WITHOUT PELVIS) 2-3V LEFT COMPARISON:  None. FINDINGS: The bones are osteopenic. There are findings suspicious for subcapital left femoral neck fracture on the frogleg lateral view. There is no evidence for dislocation. There are mild-to-moderate degenerative changes of both hips. IMPRESSION: 1. Limited by osteopenia. Findings suspicious for subcapital left femoral neck fracture. Please correlate clinically. Electronically Signed   By: Darliss Cheney M.D.   On: 04/27/2021 21:38    Family History Reviewed and non-contributory, no pertinent history of problems with bleeding or anesthesia    Review of Systems Limited due to patient's inability to communicate     OBJECTIVE  Vitals:Patient Vitals for the past 8 hrs:  BP Temp Temp src Pulse Resp SpO2  04/28/21 0900 (!) 143/88 98.4 F (36.9 C) Oral 78 16 94 %  04/28/21 0730 (!) 157/100 -- -- 83 15 --  04/28/21 0600 (!) 153/95 -- -- 82 19 93 %  04/28/21 0430 (!) 158/97 -- -- 81  16 94 %  04/28/21 0330 (!) 144/85 -- -- 86 20 91 %  04/28/21 0300 (!) 150/96 -- -- 88 17 93 %   General: Alert, no acute distress Cardiovascular: Extremities are warm Respiratory: No cyanosis, no use of accessory musculature Skin: No lesions in the area of chief complaint  Neurologic: Sensation intact distally  Psychiatric: Patient is hard of hearing and just received some morphine.  She does not respond to questions appropriately.  Lymphatic: No swelling obvious and reported other than the area involved in the exam below Extremities  LLE: Extremity held in a fixed position.  ROM deferred due to known fracture.  Responds to light touch on her foot. Spontaneous movement. 2+ DP pulse.  Toes are WWP.   RLE: 2+ DP pulse.  Toes are WWP.  Responds to light touch on  her foot. Spontaneous movement. 2+ DP pulse.  Toes are WWP.   Left shoulder with some visible bruising.  Limited ROM due to pain.  Bruising and swelling of hand. Fingers are warm and well perfused.   Test Results Imaging XR of the Left hip demonstrates a Non displaced femoral neck fracture.  Labs cbc Recent Labs    04/27/21 2230 04/28/21 0656  WBC 18.3* 15.2*  HGB 13.7 15.3*  HCT 42.3 46.6*  PLT 207 221    Labs inflam No results for input(s): CRP in the last 72 hours.  Invalid input(s): ESR  Labs coag Recent Labs    04/27/21 2230  INR 1.0    Recent Labs    04/27/21 2230  NA 135  K 3.5  CL 101  CO2 23  GLUCOSE 130*  BUN 25*  CREATININE 1.01*  CALCIUM 9.3

## 2021-04-28 NOTE — Assessment & Plan Note (Addendum)
-  Mechanical fall ?-She did hit her head -CT head and cervical spine showed no acute intracranial process.  No acute fracture or traumatic subluxation of the C-spine.  Mild diffuse atrophy and mild chronic small vessel ischemic changes.  And degenerative changes. ?-Patient is not on blood thinners ?-PT evaluated and will need home health services which is arranged ?

## 2021-04-28 NOTE — Anesthesia Procedure Notes (Signed)
Procedure Name: LMA Insertion ?Date/Time: 04/28/2021 10:48 AM ?Performed by: Windell Norfolk, MD ?Pre-anesthesia Checklist: Patient identified, Emergency Drugs available, Suction available, Patient being monitored and Timeout performed ?Patient Re-evaluated:Patient Re-evaluated prior to induction ?Oxygen Delivery Method: Circle system utilized ?Preoxygenation: Pre-oxygenation with 100% oxygen ?Induction Type: IV induction ?LMA: LMA inserted ?LMA Size: 3.0 ?Dental Injury: Teeth and Oropharynx as per pre-operative assessment  ? ? ? ? ?

## 2021-04-28 NOTE — Assessment & Plan Note (Addendum)
BP uncontrolled in the ED as high as 183/103 ?Now well controlled on home meds, likely was elevated due to acute pain of fracture   ?

## 2021-04-28 NOTE — Anesthesia Postprocedure Evaluation (Signed)
Anesthesia Post Note ? ?Patient: CHESNIE CAPELL ? ?Procedure(s) Performed: CANNULATED HIP PINNING (Left: Hip) ? ?Patient location during evaluation: PACU ?Anesthesia Type: General ?Level of consciousness: awake and alert ?Pain management: pain level controlled ?Vital Signs Assessment: post-procedure vital signs reviewed and stable ?Respiratory status: spontaneous breathing, nonlabored ventilation, respiratory function stable and patient connected to nasal cannula oxygen ?Cardiovascular status: blood pressure returned to baseline and stable ?Postop Assessment: no apparent nausea or vomiting ?Anesthetic complications: no ? ? ?No notable events documented. ? ? ?Last Vitals:  ?Vitals:  ? 04/28/21 0730 04/28/21 0900  ?BP: (!) 157/100 (!) 143/88  ?Pulse: 83 78  ?Resp: 15 16  ?Temp:  36.9 ?C  ?SpO2:  94%  ?  ?Last Pain:  ?Vitals:  ? 04/28/21 0900  ?TempSrc: Oral  ?PainSc:   ? ? ?  ?  ?  ?  ?  ?  ? ?Windell Norfolk ? ? ? ? ?

## 2021-04-28 NOTE — Assessment & Plan Note (Signed)
Continue Synthroid °

## 2021-04-28 NOTE — Assessment & Plan Note (Addendum)
Subcapital femoral neck fracture left ?Postop day #1 s/p repair by Dr. Amedeo Kinsman ?Pt will do aspirin 81 mg BID x 6 weeks for DVT prophylaxis ?Pt to follow up with Dr. Amedeo Kinsman in 2 weeks for wound check and Xrays ?DC home with Home health today  ?

## 2021-04-28 NOTE — Progress Notes (Signed)
Patient has a deep cut on finger on her left hand from the fall she had prior to admission. Cut was cleaned and bandaged placed on it. MD Laural Benes notified.  ?

## 2021-04-28 NOTE — Transfer of Care (Signed)
Immediate Anesthesia Transfer of Care Note ? ?Patient: Valerie Davenport ? ?Procedure(s) Performed: CANNULATED HIP PINNING (Left: Hip) ? ?Patient Location: PACU ? ?Anesthesia Type:General ? ?Level of Consciousness: drowsy ? ?Airway & Oxygen Therapy: Patient Spontanous Breathing ? ?Post-op Assessment: Report given to RN and Post -op Vital signs reviewed and stable ? ?Post vital signs: Reviewed and stable ? ?Last Vitals:  ?Vitals Value Taken Time  ?BP 160/90 04/28/21 1154  ?Temp    ?Pulse 94 04/28/21 1155  ?Resp 8 04/28/21 1155  ?SpO2 91 % 04/28/21 1155  ?Vitals shown include unvalidated device data. ? ?Last Pain:  ?Vitals:  ? 04/28/21 0900  ?TempSrc: Oral  ?PainSc:   ?   ? ?  ? ?Complications: No notable events documented. ?

## 2021-04-28 NOTE — Anesthesia Preprocedure Evaluation (Signed)
Anesthesia Evaluation  ?Patient identified by MRN, date of birth, ID band ?Patient awake ? ? ? ?Reviewed: ?Allergy & Precautions, H&P , NPO status , Patient's Chart, lab work & pertinent test results, reviewed documented beta blocker date and time  ? ?Airway ?Mallampati: II ? ?TM Distance: >3 FB ?Neck ROM: full ? ? ? Dental ?no notable dental hx. ? ?  ?Pulmonary ?neg pulmonary ROS,  ?  ?Pulmonary exam normal ?breath sounds clear to auscultation ? ? ? ? ? ? Cardiovascular ?Exercise Tolerance: Good ?hypertension, negative cardio ROS ? ? ?Rhythm:regular Rate:Normal ? ? ?  ?Neuro/Psych ? Headaches, PSYCHIATRIC DISORDERS Anxiety TIA  ? GI/Hepatic ?negative GI ROS, Neg liver ROS,   ?Endo/Other  ?Hypothyroidism  ? Renal/GU ?negative Renal ROS Bladder dysfunction ? ? ? ?  ?Musculoskeletal ? ? Abdominal ?  ?Peds ? Hematology ?negative hematology ROS ?(+)   ?Anesthesia Other Findings ? ? Reproductive/Obstetrics ?negative OB ROS ? ?  ? ? ? ? ? ? ? ? ? ? ? ? ? ?  ?  ? ? ? ? ? ? ? ? ?Anesthesia Physical ?Anesthesia Plan ? ?ASA: 2 ? ?Anesthesia Plan: General and General LMA  ? ?Post-op Pain Management:   ? ?Induction:  ? ?PONV Risk Score and Plan: Ondansetron ? ?Airway Management Planned:  ? ?Additional Equipment:  ? ?Intra-op Plan:  ? ?Post-operative Plan:  ? ?Informed Consent: I have reviewed the patients History and Physical, chart, labs and discussed the procedure including the risks, benefits and alternatives for the proposed anesthesia with the patient or authorized representative who has indicated his/her understanding and acceptance.  ? ? ? ?Dental Advisory Given ? ?Plan Discussed with: CRNA ? ?Anesthesia Plan Comments:   ? ? ? ? ? ? ?Anesthesia Quick Evaluation ? ?

## 2021-04-28 NOTE — ED Notes (Signed)
Pt in gown and all jewelry taken off. Family member at bedside stated she would take out pts hearing aid right before surgery comes to get her as pt is very hard of hearing. ?

## 2021-04-28 NOTE — Assessment & Plan Note (Addendum)
Reactive, now trending down ?Likely acute phase reactant in the setting of subcapital femoral neck fracture ?No infectious symptoms ?

## 2021-04-28 NOTE — Hospital Course (Addendum)
88 y.o. female with medical history significant of with history of anxiety, arthritis, hyperlipidemia, hypertension, hypothyroidism, and more presents the ED with a chief complaint of fall.  Unfortunately I am seeing patient after she had morphine, and she is not answering questions appropriately.  For example she asked me if I needed something to sleep in, and told me that she likes what I brought her to sleep and for her birthday.  Granddaughter at bedside reports that patient was trying to open a gate on loose gravel when she slipped on the gravel and fell.  She landed on her left side, hit her head.  The fall was not witnessed, but they do not think she lost consciousness because she took her cane and started hitting the neighbors door to get the neighbor to come out and help her.  Patient does live independently.  She takes care of self but she no longer drives.  She ambulates with either a cane or walker.  She is able to hold a normal conversation recognize people around her.  At the time of my exam she is not recognizing her granddaughter, but she does know that the doctors keep waking her up.  Granddaughter reports the patient has been in her normal state of health until this fall. ?  ?Patient does not smoke, does not drink.  She is vaccinated for COVID.  Granddaughter thinks patient would prefer to be full code. ? ?04/28/2021:  POD#0 s/p CRPP of the Left femoral neck fracture.   ? ?04/29/2021: POD#1 Asa 81 mg BID started for DVT prophylaxis.  Pt ambulated hall with PT and they recommend [facility] services and family agreeable.  Pt seen by ortho and recommended 2 week follow up for xrays and wound check.  DC home with [facility] today.   ?

## 2021-04-28 NOTE — Plan of Care (Signed)

## 2021-04-28 NOTE — Assessment & Plan Note (Addendum)
-  RESOLVED NOW ?

## 2021-04-28 NOTE — Assessment & Plan Note (Addendum)
--  IV replacement ordered and was repleted ?

## 2021-04-28 NOTE — Progress Notes (Signed)
Patient's BP is 180/129 and hr 105. MD Carren Rang notified.PRN hydralazine given.  ?

## 2021-04-28 NOTE — H&P (Signed)
?History and Physical  ? ? ?Patient: Valerie Dickereggy A Clements ZOX:[id number] DOB: 03/11/1931 ?DOA: 04/27/2021 ?DOS: the patient was seen and examined on 04/28/2021 ?PCP: Lavada MesiHilts, Michael, MD  ?Patient coming from: Home ? ?Chief Complaint:  ?Chief Complaint  ?Patient presents with  ? Fall  ? ?HPI: Valerie Davenport is a 88 y.o. female with medical history significant of with history of anxiety, arthritis, hyperlipidemia, hypertension, hypothyroidism, and more presents the ED with a chief complaint of fall.  Unfortunately I am seeing patient after she had morphine, and she is not answering questions appropriately.  For example she asked me if I needed something to sleep in, and told me that she likes what I brought her to sleep and for her birthday.  Granddaughter at bedside reports that patient was trying to open a gate on loose gravel when she slipped on the gravel and fell.  She landed on her left side, hit her head.  The fall was not witnessed, but they do not think she lost consciousness because she took her cane and started hitting the neighbors door to get the neighbor to come out and help her.  Patient does live independently.  She takes care of self but she no longer drives.  She ambulates with either a cane or walker.  She is able to hold a normal conversation recognize people around her.  At the time of my exam she is not recognizing her granddaughter, but she does know that the doctors keep waking her up.  Granddaughter reports the patient has been in her normal state of health until this fall. ? ?Patient does not smoke, does not drink.  She is vaccinated for COVID.  Granddaughter thinks patient would prefer to be full code. ?Review of Systems: unable to review all systems due to the inability of the patient to answer questions. ?Past Medical History:  ?Diagnosis Date  ? Anxiety   ? but doesn't require meds  ? Arthritis   ? Chronic back pain   ? HNP-takes Tramadol and Pain Pill(at night)  ? History of blood transfusion   ? no  abnormal reaction noted  ? History of migraine   ? last one about 513yrs ago  ? History of shingles   ? Hyperlipidemia   ? takes Simvastatin daily  ? Hypertension   ? takes Enalapril and Chlorthalidone daily  ? Hypothyroidism   ? takes Levothyroxine daily  ? Joint pain   ? Nocturia   ? ?Past Surgical History:  ?Procedure Laterality Date  ? ABDOMINAL HYSTERECTOMY    ? BACK SURGERY    ? bilateral cataract surgery    ? COLONOSCOPY    ? LUMBAR LAMINECTOMY  02/20/2012  ? LUMBAR LAMINECTOMY  02/20/2012  ? Procedure: MICRODISCECTOMY LUMBAR LAMINECTOMY;  Surgeon: Eldred MangesMark C Yates, MD;  [facility];  Service: Orthopedics;  Laterality: N/A;  Right L4 hemilaminectomy, microdiscectomy, removal of free fragment  ? ?Social History:  reports that she has never smoked. She has never used smokeless tobacco. She reports that she does not drink alcohol and does not use drugs. ? ?No Known Allergies ? ?Family History  ?Problem Relation Age of Onset  ? Hypertension Other   ? Liver cancer Daughter   ? Heart disease Daughter   ? Other Son   ?     drowning  ? COPD Son   ? Breast cancer Neg Hx   ? ? ?Prior to Admission medications   ?Medication Sig Start Date End Date Taking? Authorizing Provider  ?  celecoxib (CELEBREX) 100 MG capsule Take 1 capsule (100 mg total) by mouth 2 (two) times daily as needed. 06/08/20   Hilts, Casimiro Needle, MD  ?diazepam (VALIUM) 2 MG tablet TAKE 1/2 TO 1 TABLET(1 TO 2 MG) BY MOUTH TWICE DAILY AS NEEDED FOR ANXIETY 08/12/19   Hilts, Casimiro Needle, MD  ?diclofenac Sodium (VOLTAREN) 1 % GEL Apply 4 g topically 4 (four) times daily as needed. 02/03/19   Hilts, Casimiro Needle, MD  ?enalapril (VASOTEC) 20 MG tablet TAKE 1 TABLET (20 MG TOTAL) DAILY. 09/05/20   Hilts, Casimiro Needle, MD  ?levothyroxine (SYNTHROID) 50 MCG tablet TAKE 1 TABLET (50 MCG TOTAL) BY MOUTH DAILY BEFORE BREAKFAST. 05/16/20   Hilts, Casimiro Needle, MD  ?meclizine (ANTIVERT) 25 MG tablet Take 25 mg by mouth 2 (two) times daily as needed. ?Patient not taking: Reported on 04/22/2021 01/10/19    [provider]  ?mupirocin ointment (BACTROBAN) 2 % Apply 1 application topically 2 (two) times daily. 10/03/20   Tarry Kos, MD  ?ondansetron (ZOFRAN ODT) 4 MG disintegrating tablet Take 1 tablet (4 mg total) by mouth every 8 (eight) hours as needed for nausea or vomiting. ?Patient not taking: Reported on 04/22/2021 05/18/19   Burgess Amor, PA-C  ?predniSONE (DELTASONE) 10 MG tablet Take as directed for 12 days.  Daily dose 6,6,5,5,4,4,3,3,2,2,1,1. ?Patient not taking: Reported on 12/18/2020 02/07/20   Hilts, Casimiro Needle, MD  ?predniSONE (STERAPRED UNI-PAK 21 TAB) 10 MG (21) TBPK tablet Take as directed ?Patient not taking: Reported on 12/18/2020 10/03/20   Tarry Kos, MD  ?simvastatin (ZOCOR) 20 MG tablet Take 1 tablet (20 mg total) by mouth daily. 07/06/20   Hilts, Casimiro Needle, MD  ?amLODipine (NORVASC) 5 MG tablet Take 1 tablet (5 mg total) by mouth daily. ?Patient not taking: Reported on 11/10/2018 10/26/16 01/27/19  Glynn Octave, MD  ? ? ?Physical Exam: ?Vitals:  ? 04/27/21 2300 04/27/21 2330 04/28/21 0030 04/28/21 0100  ?BP: (!) 193/110 (!) 146/94 (!) 168/103 (!) 142/89  ?Pulse: 96 92 93 86  ?Resp: 20 16 20 15   ?Temp:      ?TempSrc:      ?SpO2: 96% 99% 94% 90%  ?Weight:      ?Height:      ? ?1.  General: ?Patient lying supine in bed,  no acute distress ?  ?2. Psychiatric: ?Alert and disoriented, pleasant and cooperative with exam ?  ?3. Neurologic: ?Speech and language are normal, face is symmetric, moves all 4 extremities voluntarily, at baseline without acute deficits on limited exam ?  ?4. HEENMT:  ?Head is with large bruise over left temple, normocephalic, pupils reactive to light, neck is supple, trachea is midline, mucous membranes are moist ?  ?5. Respiratory : ?Lungs are clear to auscultation bilaterally without wheezing, rhonchi, rales, no cyanosis, no increase in work of breathing or accessory muscle use ?  ?6. Cardiovascular : ?Heart rate normal, rhythm is regular, no murmurs, rubs or gallops,  no peripheral edema, peripheral pulses palpated ?  ?7. Gastrointestinal:  ?Abdomen is soft, nondistended, nontender to palpation bowel sounds active, no masses or organomegaly palpated ?  ?8. Skin:  ?Skin is warm, dry and intact without rashes, acute lesions, or ulcers on limited exam ?  ?9.Musculoskeletal:  ? no asymmetry in tone, no peripheral edema, peripheral pulses palpated, no tenderness to palpation in the extremities ? ?Data Reviewed: ?In the ED ?Temp 98, heart rate 93-99, respiratory rate 14-21, blood pressure 183/103, satting at 94% on room air ?Leukocytosis with a white blood cell count of  18.3, hemoglobin stable at 13.7 ?Chemistry panel is unremarkable ?X-ray left foot shows degenerative changes without acute fracture ?X-ray left hand shows prominent degenerative changes without acute fracture ?X-ray left hip shows subcapital left femoral neck fracture ?X-ray left knee shows CPPD.  Tricompartmental degenerative changes without acute fracture ?X-ray left shoulder shows no acute fracture or dislocation ?Tylenol initially given for pain, and then morphine ?Ortho was consulted and recommends patient n.p.o. after midnight for surgery in the morning ?TXA and Ancef ordered for morning ? ?Assessment and Plan: ?* Hip fracture (HCC) ?Subcapital femoral neck fracture left ?N.p.o. after midnight ?Ortho plans for surgery 04/28/21 ?Tylenol, oxycodone, morphine for pain control ?Continue to monitor ? ?Acute metabolic encephalopathy ?-Patient was at her normal, baseline mentation until she was given more pain ?-She is now " talking out of her head," but her pain is controlled ?Continue to monitor ? ?Fall at home, initial encounter ?-Mechanical fall ?-She did hit her head -CT head and cervical spine showed no acute intracranial process.  No acute fracture or traumatic subluxation of the C-spine.  Mild diffuse atrophy and mild chronic small vessel ischemic changes.  And degenerative changes. ?-Patient is not on blood  thinners ?-Patient will need PT later in this hospitalization ? ?Leukocytosis ?White blood cell count is 18.3 ?Likely acute phase reactant in the setting of subcapital femoral neck fracture ?No infectious s

## 2021-04-28 NOTE — ED Notes (Signed)
Hospitalist at bedside 

## 2021-04-29 DIAGNOSIS — D72829 Elevated white blood cell count, unspecified: Secondary | ICD-10-CM

## 2021-04-29 LAB — BASIC METABOLIC PANEL
Anion gap: 9 (ref 5–15)
BUN: 22 mg/dL (ref 8–23)
CO2: 25 mmol/L (ref 22–32)
Calcium: 8.9 mg/dL (ref 8.9–10.3)
Chloride: 99 mmol/L (ref 98–111)
Creatinine, Ser: 0.83 mg/dL (ref 0.44–1.00)
GFR, Estimated: >60 mL/min (ref >60)
Glucose, Bld: 112 mg/dL — ABNORMAL HIGH (ref 70–99)
Potassium: 3.9 mmol/L (ref 3.5–5.1)
Sodium: 133 mmol/L — ABNORMAL LOW (ref 135–145)

## 2021-04-29 LAB — CBC
HCT: 39.2 % (ref 36.0–46.0)
Hemoglobin: 12.6 g/dL (ref 12.0–15.0)
MCH: 29.2 pg (ref 26.0–34.0)
MCHC: 32.1 g/dL (ref 30.0–36.0)
MCV: 90.7 fL (ref 80.0–100.0)
Platelets: 185 10*3/uL (ref 150–400)
RBC: 4.32 MIL/uL (ref 3.87–5.11)
RDW: 13.8 % (ref 11.5–15.5)
WBC: 14 10*3/uL — ABNORMAL HIGH (ref 4.0–10.5)
nRBC: 0 % (ref 0.0–0.2)

## 2021-04-29 LAB — MAGNESIUM: Magnesium: 2.3 mg/dL (ref 1.7–2.4)

## 2021-04-29 MED ORDER — ASPIRIN EC 81 MG PO TBEC
81.0000 mg | DELAYED_RELEASE_TABLET | Freq: Two times a day (BID) | ORAL | Status: DC
  Administered 2021-04-29: 81 mg via ORAL
  Filled 2021-04-29: qty 1

## 2021-04-29 MED ORDER — ACETAMINOPHEN 325 MG PO TABS
650.0000 mg | ORAL_TABLET | Freq: Three times a day (TID) | ORAL | Status: DC
Start: 2021-04-29 — End: 2023-02-20

## 2021-04-29 MED ORDER — OXYCODONE HCL 5 MG PO TABS
5.0000 mg | ORAL_TABLET | ORAL | 0 refills | Status: AC | PRN
Start: 2021-04-29 — End: 2021-05-04

## 2021-04-29 MED ORDER — PANTOPRAZOLE SODIUM 40 MG PO TBEC
40.0000 mg | DELAYED_RELEASE_TABLET | Freq: Every day | ORAL | Status: DC
  Administered 2021-04-29: 40 mg via ORAL
  Filled 2021-04-29: qty 1

## 2021-04-29 MED ORDER — ASPIRIN EC 81 MG PO TBEC: DELAYED_RELEASE_TABLET | ORAL | 11 refills | Status: DC

## 2021-04-29 MED ORDER — PANTOPRAZOLE SODIUM 40 MG PO TBEC: 40.0000 mg | DELAYED_RELEASE_TABLET | Freq: Every day | ORAL | 2 refills | Status: DC

## 2021-04-29 NOTE — TOC Initial Note (Signed)
Transition of Care (TOC) - Initial/Assessment Note  ? ? ?Patient Details  ?Name: Valerie Davenport ?MRN: [id number] ?Date of Birth: 1931/05/14 ? ?Transition of Care (TOC) CM/SW Contact:    ?Karn Cassis, LCSW ?Phone Number: ?04/29/2021, 11:02 AM ? ?Clinical Narrative:   LCSW spoke with pt and son to complete assessment. Pt lives alone and is typically very independent. She fell and fractured her hip. Post-op day 1.  Pt reports she ambulated down hall this morning with PT. Discussed PT recommendation for SNF. They want pt to return home with home health and are not interested in SNF. No preference on home health agency. Referred and accepted by Kandee Ke[facility] with [facility] for PT, OT, RN, and aide. Pt's son reports they have all DME needed at home already. Anticipate d/c later today. MD notified for orders.              ? ? ?Expected Discharge Plan: Home w Home Health Services ?Barriers to Discharge: Barriers Resolved ? ? ?Patient Goals and CMS Choice ?Patient states their goals for this hospitalization and ongoing recovery are:: return home ?  ?Choice offered to / list presented to : Patient ? ?Expected Discharge Plan and Services ?Expected Discharge Plan: Home w Home Health Services ?In-house Referral: Clinical Social Work ?  ?Post Acute Care Choice: Skilled Nursing Facility ?Living arrangements for the past 2 months: Single Family Home ?                ?  ?  ?  ?  ?  ?HH Arranged: Nurse's Aide, RN, PT, OT ?HH Agency: [facility] ?Date HH Agency Contacted: 04/29/21 ?Time HH Agency Contacted: 1059 ?Representative spoke with at [facility] Agency: Kandee Keen ? ?Prior Living Arrangements/Services ?Living arrangements for the past 2 months: Single Family Home ?Lives with:: Self ?Patient language and need for interpreter reviewed:: Yes ?Do you feel safe going back to the place where you live?: Yes      ?Need for Family Participation in Patient Care: Yes (Comment) ?Care giver support system in place?: Yes (comment) ?Current home  services: DME (walker, 3N1) ?Criminal Activity/Legal Involvement Pertinent to Current Situation/Hospitalization: No - Comment as needed ? ?Activities of Daily Living ?Home Assistive Devices/Equipment: Cane (specify quad or straight), Walker (specify type), Hearing aid, Shower chair with back ?ADL Screening (condition at time of admission) ?Patient's cognitive ability adequate to safely complete daily activities?: No ?Is the patient deaf or have difficulty hearing?: Yes ?Does the patient have difficulty seeing, even when wearing glasses/contacts?: Yes ?Does the patient have difficulty concentrating, remembering, or making decisions?: Yes ?Patient able to express need for assistance with ADLs?: No ?Does the patient have difficulty dressing or bathing?: Yes ?Independently performs ADLs?: No ?Communication: Needs assistance ?Is this a change from baseline?: Change from baseline, expected to last >3 days ?Dressing (OT): Needs assistance ?Is this a change from baseline?: Change from baseline, expected to last >3 days ?Grooming: Needs assistance ?Is this a change from baseline?: Change from baseline, expected to last >3 days ?Feeding: Independent ?Bathing: Needs assistance ?Is this a change from baseline?: Change from baseline, expected to last >3 days ?Toileting: Needs assistance ?Is this a change from baseline?: Change from baseline, expected to last >3days ?In/Out Bed: Needs assistance ?Is this a change from baseline?: Change from baseline, expected to last >3 days ?Walks in Home: Needs assistance ?Is this a change from baseline?: Change from baseline, expected to last >3 days ?Does the patient have difficulty walking or climbing stairs?:  Yes ?Weakness of Legs: Both ?Weakness of Arms/Hands: None ? ?Permission Sought/Granted ?  ?Permission granted to share information with : Yes, Verbal Permission Granted ?   ?   ? Permission granted to share info w Relationship: son ?   ? ?Emotional Assessment ?  ?  ?Affect (typically  observed): Appropriate ?Orientation: : Oriented to Self, Oriented to Place, Oriented to  Time ?Alcohol / Substance Use: Not Applicable ?Psych Involvement: No (comment) ? ?Admission diagnosis:  Hip fracture (HCC) [S72.009A] ?Fall, initial encounter [W19.XXXA] ?Closed fracture of left femur, unspecified fracture morphology, unspecified portion of femur, initial encounter (HCC) [S72.92XA] ?Patient Active Problem List  ? Diagnosis Date Noted  ? Hip fracture (HCC) 04/28/2021  ? Leukocytosis 04/28/2021  ? Fall at home, initial encounter 04/28/2021  ? Acute metabolic encephalopathy 04/28/2021  ? Hypomagnesemia 04/28/2021  ? Hypothyroid 06/20/2019  ? Headache 03/12/2019  ? TIA (transient ischemic attack) 03/11/2019  ? Essential hypertension 11/10/2018  ? HNP (herniated nucleus pulposus), lumbar 02/20/2012  ?  Class: Diagnosis of  ? ?PCP:  Lavada Mesi, MD ?Pharmacy:   ?[id number] - [place], [place] - [address] ?[address] ?[place] [address] ?Phone: [phone] Fax: [phone] ? ?[facility] - [place], [place] - [address] ?[phone] ?[place] [address] ?Phone: [phone] Fax: [phone] ? ? ? ? ?Social Determinants of Health (SDOH) Interventions ?  ? ?Readmission Risk Interventions ?   ? View : No data to display.  ?  ?  ?  ? ? ? ?

## 2021-04-29 NOTE — Care Management Important Message (Signed)
Important Message ? ?Patient Details  ?Name: Valerie Davenport ?MRN: [id number] ?Date of Birth: 21-Jul-1931 ? ? ?Medicare Important Message Given:  N/A - LOS <3 / Initial given by admissions ? ? ? ? ?Tommy Medal ?04/29/2021, 11:52 AM ?

## 2021-04-29 NOTE — TOC Transition Note (Signed)
Transition of Care (TOC) - CM/SW Discharge Note ? ? ?Patient Details  ?Name: Valerie Davenport ?MRN: [id number] ?Date of Birth: 1931/10/26 ? ?Transition of Care (TOC) CM/SW Contact:  ?Karn Cassis, LCSW ?Phone Number: ?04/29/2021, 11:05 AM ? ? ?Clinical Narrative:  Home health arranged. RN updated.   ? ? ? ?Final next level of care: Home w Home Health Services ?Barriers to Discharge: Barriers Resolved ? ? ?Patient Goals and CMS Choice ?Patient states their goals for this hospitalization and ongoing recovery are:: return home ?  ?Choice offered to / list presented to : Patient ? ?Discharge Placement ?  ?           ?  ?  ?Name of family member notified: son ?Patient and family notified of of transfer: 04/29/21 ? ?Discharge Plan and Services ?In-house Referral: Clinical Social Work ?  ?Post Acute Care Choice: Skilled Nursing Facility          ?  ?  ?  ?  ?  ?HH Arranged: Nurse's Aide, RN, PT, OT ?HH Agency: [facility] ?Date HH Agency Contacted: 04/29/21 ?Time HH Agency Contacted: 1059 ?Representative spoke with at [facility] Agency: Kandee Keen ? ?Social Determinants of Health (SDOH) Interventions ?  ? ? ?Readmission Risk Interventions ?   ? View : No data to display.  ?  ?  ?  ? ? ? ? ? ?

## 2021-04-29 NOTE — Evaluation (Signed)
Physical Therapy Evaluation ?Patient Details ?Name: Valerie Davenport ?MRN: [id number] ?DOB: 1931-06-13 ?Today's Date: 04/29/2021 ? ?History of Present Illness ? CRPP L femoral neck fracture due to fall when outside going to see neighbor. DOS: 04/28/21.  ?Clinical Impression ? Patient demonstrates slightly labored movement for sitting up at bedside requiring Min assist to move LLE, had block left foot due to scissoring during sit to stands, during ambulation no scissoring of LLE noted with fair/good tolerance for weightbearing on LLE without loss of balance using RW.  Patient tolerated sitting up in chair after therapy.  PLAN:  Patient to be discharged home today and discharged from acute physical therapy to care of nursing for ambulation as tolerated for length of stay with recommendations stated below  ?   ?   ? ?Recommendations for follow up therapy are one component of a multi-disciplinary discharge planning process, led by the attending physician.  Recommendations may be updated based on patient status, additional functional criteria and insurance authorization. ? ?Follow Up Recommendations Skilled nursing-short term rehab (<3 hours/day) ? ?  ?Assistance Recommended at Discharge Intermittent Supervision/Assistance  ?Patient can return home with the following ? A little help with walking and/or transfers;A little help with bathing/dressing/bathroom;Help with stairs or ramp for entrance;Assistance with cooking/housework ? ?  ?Equipment Recommendations None recommended by PT  ?Recommendations for Other Services ?    ?  ?Functional Status Assessment Patient has had a recent decline in their functional status and demonstrates the ability to make significant improvements in function in a reasonable and predictable amount of time.  ? ?  ?Precautions / Restrictions Precautions ?Precautions: Fall ?Restrictions ?Weight Bearing Restrictions: Yes ?LLE Weight Bearing: Weight bearing as tolerated  ? ?  ? ?Mobility ? Bed  Mobility ?Overal bed mobility: Needs Assistance ?Bed Mobility: Supine to Sit ?  ?  ?Supine to sit: Min assist ?  ?  ?General bed mobility comments: increased time, labored movement ?  ? ?Transfers ?Overall transfer level: Needs assistance ?Equipment used: Rolling walker (2 wheels) ?Transfers: Sit to/from Stand, Bed to chair/wheelchair/BSC ?Sit to Stand: Min assist ?  ?Step pivot transfers: Min assist ?  ?  ?  ?General transfer comment: tends to scissor left leg during sit to stands requiring left foot blocked ?  ? ?Ambulation/Gait ?Ambulation/Gait assistance: Min assist ?Gait Distance (Feet): 55 Feet ?Assistive device: Rolling walker (2 wheels) ?Gait Pattern/deviations: Decreased step length - left, Decreased stance time - right, Decreased stride length, Decreased stance time - left ?Gait velocity: decreased ?  ?  ?General Gait Details: slightly labored unsteady cadence with fair/good tolerance for weightbearing on LLE, no loss of balance, limited mostly due to fatigue ? ?Stairs ?  ?  ?  ?  ?  ? ?Wheelchair Mobility ?  ? ?Modified Rankin (Stroke Patients Only) ?  ? ?  ? ?Balance Overall balance assessment: Needs assistance ?Sitting-balance support: Feet supported, No upper extremity supported ?Sitting balance-Leahy Scale: Fair ?Sitting balance - Comments: fair/good seated at EOB ?  ?Standing balance support: During functional activity, Bilateral upper extremity supported ?Standing balance-Leahy Scale: Fair ?Standing balance comment: using RW ?  ?  ?  ?  ?  ?  ?  ?  ?  ?  ?  ?   ? ? ? ?Pertinent Vitals/Pain Pain Assessment ?Pain Assessment: Faces ?Faces Pain Scale: Hurts little more ?Pain Location: left hip with movement ?Pain Descriptors / Indicators: Sore ?Pain Intervention(s): Limited activity within patient's tolerance, Monitored during session, Repositioned  ? ? ?Home  Living Family/patient expects to be discharged to:: Private residence ?  ?Available Help at Discharge: Family;Available 24 hours/day ?Type of  Home: Mobile home ?Home Access: Ramped entrance ?  ?  ?  ?Home Layout: One level ?Home Equipment: Conservation officer, nature (2 wheels);Cane - single point;Shower seat;BSC/3in1 ?   ?  ?Prior Function Prior Level of Function : Independent/Modified Independent ?  ?  ?  ?  ?  ?  ?Mobility Comments: Household and short distanced community ambulator using RW or SPC ?ADLs Comments: Assisted by family ?  ? ? ?Hand Dominance  ?   ? ?  ?Extremity/Trunk Assessment  ? Upper Extremity Assessment ?Upper Extremity Assessment: Defer to OT evaluation ?  ? ?Lower Extremity Assessment ?Lower Extremity Assessment: Generalized weakness;LLE deficits/detail ?LLE Deficits / Details: grossly -4/5 ?LLE: Unable to fully assess due to pain ?LLE Sensation: WNL ?LLE Coordination: WNL ?  ? ?Cervical / Trunk Assessment ?Cervical / Trunk Assessment: Normal  ?Communication  ? Communication: HOH  ?Cognition Arousal/Alertness: Awake/alert ?Behavior During Therapy: [facility] for tasks assessed/performed ?Overall Cognitive Status: Within Functional Limits for tasks assessed ?  ?  ?  ?  ?  ?  ?  ?  ?  ?  ?  ?  ?  ?  ?  ?  ?  ?  ?  ? ?  ?General Comments   ? ?  ?Exercises    ? ?Assessment/Plan  ?  ?PT Assessment All further PT needs can be met in the next venue of care  ?PT Problem List Decreased strength;Decreased activity tolerance;Decreased balance;Decreased mobility ? ?   ?  ?PT Treatment Interventions     ? ?PT Goals (Current goals can be found in the Care Plan section)  ?Acute Rehab PT Goals ?Patient Stated Goal: return home with family to assist ?PT Goal Formulation: With patient/family ?Time For Goal Achievement: 04/29/21 ?Potential to Achieve Goals: Good ? ?  ?Frequency   ?  ? ? ?Co-evaluation PT/OT/SLP Co-Evaluation/Treatment: Yes ?Reason for Co-Treatment: To address functional/ADL transfers ?PT goals addressed during session: Mobility/safety with mobility;Balance;Proper use of DME ?  ?  ? ? ?  ?AM-PAC PT "6 Clicks" Mobility  ?Outcome Measure Help needed turning  from your back to your side while in a flat bed without using bedrails?: A Little ?Help needed moving from lying on your back to sitting on the side of a flat bed without using bedrails?: A Little ?Help needed moving to and from a bed to a chair (including a wheelchair)?: A Little ?Help needed standing up from a chair using your arms (e.g., wheelchair or bedside chair)?: A Little ?Help needed to walk in hospital room?: A Little ?Help needed climbing 3-5 steps with a railing? : A Lot ?6 Click Score: 17 ? ?  ?End of Session   ?Activity Tolerance: Patient tolerated treatment well;Patient limited by fatigue ?Patient left: in chair;with call bell/phone within reach ?Nurse Communication: Mobility status ?PT Visit Diagnosis: Unsteadiness on feet (R26.81);Other abnormalities of gait and mobility (R26.89);Muscle weakness (generalized) (M62.81) ?  ? ?Time: 8590-9311 ?PT Time Calculation (min) (ACUTE ONLY): 21 min ? ? ?Charges:   PT Evaluation ?$PT Eval Moderate Complexity: 1 Mod ?PT Treatments ?$Therapeutic Activity: 8-22 mins ?  ?   ? ? ?2:30 PM, 04/29/21 ?Lonell Grandchild, MPT ?Physical Therapist with  ?[facility] ?[phone] office ?[phone] mobile phone ? ? ?

## 2021-04-29 NOTE — Progress Notes (Signed)
Pt has discharge orders, discharge teaching given and no further questions at this time. Pt has laceration to left hand-pointer finger at which was noted yesterday by day shift nurse. Laceration accorded during fall at home. MD made aware by this nurse. Family made aware and per MD family/patient is to put triple abt ointment on laceration. Pt family also given tape and non adherent dressing for protection if needed. No further questions at this time. Pt wheeled to main lobby via w/c by staff to vehicle accompanied by granddaughter.  ?

## 2021-04-29 NOTE — Progress Notes (Signed)
? ?  ORTHOPAEDIC PROGRESS NOTE ? ?s/p Procedure(s): ?CRPP L femoral neck fracture ? ?DOS: 04/28/21 ? ?SUBJECTIVE: ?Left hip feels better.  No issues over night.  No family at the bedside.  Continues to have left shoulder pain.  ? ?OBJECTIVE: ?PE: ? ?Elderly female.  Alert and oriented.  Hard of hearing.  Responding to questions ? ?Left hip dressing is clean, dry and intact ?She is sitting in bedside chair ?Able to maintain a straight leg raise.  ?Toes are warm and well perfused ?She is wiggling her toes.  ?Responds to light touch ? ?Vitals:  ? 04/28/21 2315 04/29/21 0348  ?BP: 124/66 119/71  ?Pulse: 81 76  ?Resp: 18 18  ?Temp:  98.1 ?F (36.7 ?C)  ?SpO2: 96% 95%  ? ? ?  Latest Ref Rng & Units 04/29/2021  ?  5:03 AM 04/28/2021  ?  6:56 AM 04/27/2021  ? 10:30 PM  ?CBC  ?WBC 4.0 - 10.5 K/uL 14.0   15.2   18.3    ?Hemoglobin 12.0 - 15.0 g/dL 81.4   48.1   85.6    ?Hematocrit 36.0 - 46.0 % 39.2   46.6   42.3    ?Platelets 150 - 400 K/uL 185   221   207    ? ? ? ?ASSESSMENT: ?Valerie Davenport is a 88 y.o. female doing well postoperatively.  Feeling better postop ? ?PLAN: ?Weightbearing: WBAT LLE ?Insicional and dressing care: Reinforce dressings as needed ?Orthopedic device(s): None ?VTE prophylaxis: Recommend Aspirin 81mg  BID  6 weeks ; to start POD#1 ?Pain control: PO pain medications PRN ?Follow - up plan: 2 weeks for incision evaluation and XR.  If DC to a nursing facility, the sutures can be trimmed around 2 weeks postop and she can see me in clinic in 6 weeks.  ? ? ?Contact information:   ? ? ?Vernecia Umble A. , MD MS ?[facility] ?[address] ?[place],  [place]  [place] ?Phone: [phone] ?Fax: [phone] ? ?

## 2021-04-29 NOTE — Discharge Instructions (Signed)
Take aspirin 81 mg twice daily for 6 weeks (for blood clot prevention) and then resume once daily ? ?Please see Dr. Dallas Schimke in 2 weeks for wound check and xrays.  ? ? ?IMPORTANT INFORMATION: PAY CLOSE ATTENTION  ? ?PHYSICIAN DISCHARGE INSTRUCTIONS ? ?Follow with Primary care provider  Hilts, Casimiro Needle, MD  and other consultants as instructed by your Hospitalist Physician ? ?SEEK MEDICAL CARE OR RETURN TO EMERGENCY ROOM IF SYMPTOMS COME BACK, WORSEN OR NEW PROBLEM DEVELOPS  ? ?Please note: ?You were cared for by a hospitalist during your hospital stay. Every effort will be made to forward records to your primary care provider.  You can request that your primary care provider send for your hospital records if they have not received them.  Once you are discharged, your primary care physician will handle any further medical issues. Please note that NO REFILLS for any discharge medications will be authorized once you are discharged, as it is imperative that you return to your primary care physician (or establish a relationship with a primary care physician if you do not have one) for your post hospital discharge needs so that they can reassess your need for medications and monitor your lab values. ? ?Please get a complete blood count and chemistry panel checked by your Primary MD at your next visit, and again as instructed by your Primary MD. ? ?Get Medicines reviewed and adjusted: ?Please take all your medications with you for your next visit with your Primary MD ? ?Laboratory/radiological data: ?Please request your Primary MD to go over all hospital tests and procedure/radiological results at the follow up, please ask your primary care provider to get all Hospital records sent to his/her office. ? ?In some cases, they will be blood work, cultures and biopsy results pending at the time of your discharge. Please request that your primary care provider follow up on these results. ? ?If you are diabetic, please bring your  blood sugar readings with you to your follow up appointment with primary care.   ? ?Please call and make your follow up appointments as soon as possible.   ? ?Also Note the following: ?If you experience worsening of your admission symptoms, develop shortness of breath, life threatening emergency, suicidal or homicidal thoughts you must seek medical attention immediately by calling 911 or calling your MD immediately  if symptoms less severe. ? ?You must read complete instructions/literature along with all the possible adverse reactions/side effects for all the Medicines you take and that have been prescribed to you. Take any new Medicines after you have completely understood and accpet all the possible adverse reactions/side effects.  ? ?Do not drive when taking Pain medications or sleeping medications (Benzodiazepines) ? ?Do not take more than prescribed Pain, Sleep and Anxiety Medications. It is not advisable to combine anxiety,sleep and pain medications without talking with your primary care practitioner ? ?Special Instructions: If you have smoked or chewed Tobacco  in the last 2 yrs please stop smoking, stop any regular Alcohol  and or any Recreational drug use. ? ?Wear Seat belts while driving.  Do not drive if taking any narcotic, mind altering or controlled substances or recreational drugs or alcohol.  ? ? ? ? ? ?

## 2021-04-29 NOTE — Evaluation (Signed)
Occupational Therapy Evaluation ?Patient Details ?Name: Valerie Davenport ?MRN: [id number] ?DOB: 01-21-1932 ?Today's Date: 04/29/2021 ? ? ?History of Present Illness CRPP L femoral neck fracture due to fall when outside going to see neighbor. DOS: 04/28/21.  ? ?Clinical Impression ?  ?Patient in bed upon therapy arrival and agreeable to participate in OT evaluation. Pt demonstrates decreased strength in BUE in addition to pain and decreased ROM in the left arm since falling on it. Due to recent surgery for femur fx, patient requires increased assistance to complete basic ADL tasks and functional transfers. Recommended SNF at discharge prior to returning home.   ?   ? ?Recommendations for follow up therapy are one component of a multi-disciplinary discharge planning process, led by the attending physician.  Recommendations may be updated based on patient status, additional functional criteria and insurance authorization.  ? ?Follow Up Recommendations ? Skilled nursing-short term rehab (<3 hours/day)  ?  ?Assistance Recommended at Discharge Intermittent Supervision/Assistance  ?Patient can return home with the following A little help with walking and/or transfers;A little help with bathing/dressing/bathroom;Direct supervision/assist for financial management;Assist for transportation ? ?  ?Functional Status Assessment ? Patient has had a recent decline in their functional status and demonstrates the ability to make significant improvements in function in a reasonable and predictable amount of time.  ?Equipment Recommendations ? None recommended by OT  ?  ?   ?Precautions / Restrictions Precautions ?Precautions: Fall ?Restrictions ?Weight Bearing Restrictions: Yes ?LLE Weight Bearing: Weight bearing as tolerated  ? ?  ? ?Mobility Bed Mobility ?Overal bed mobility: Needs Assistance ?Bed Mobility: Supine to Sit ?  ?  ?Supine to sit: Min assist ?  ?  ?  ?  ? ?Transfers ?Overall transfer level: Needs assistance ?Equipment used:  Rolling walker (2 wheels) ?Transfers: Sit to/from Stand, Bed to chair/wheelchair/BSC ?Sit to Stand: Min assist ?Stand pivot transfers: Min assist ?  ?  ?  ?  ?  ?  ? ?  ?Balance Overall balance assessment: Mild deficits observed, not formally tested ?  ?   ? ?ADL either performed or assessed with clinical judgement  ? ?ADL Overall ADL's : Needs assistance/impaired ?Eating/Feeding: Set up;Sitting ?  ?Grooming: Wash/dry hands;Wash/dry face;Set up;Sitting ?  ?Upper Body Bathing: Set up;Sitting ?  ?Lower Body Bathing: Total assistance;Bed level ?  ?Upper Body Dressing : Set up;Sitting ?  ?Lower Body Dressing: Total assistance;Bed level ?  ?Toilet Transfer: Minimal assistance;BSC/3in1;Rolling walker (2 wheels) ?  ?Toileting- Clothing Manipulation and Hygiene: Total assistance;Sitting/lateral lean;Sit to/from stand ?  ?  ?  ?  ?   ? ? ? ?Vision Baseline Vision/History: 0 No visual deficits ?   ?   ?   ?   ? ?Pertinent Vitals/Pain Pain Assessment ?Pain Assessment: No/denies pain  ? ? ? ?Hand Dominance   ?  ?Extremity/Trunk Assessment Upper Extremity Assessment ?Upper Extremity Assessment: Generalized weakness ?  ?Lower Extremity Assessment ?Lower Extremity Assessment: Defer to PT evaluation ?  ?  ?  ?Communication Communication ?Communication: HOH ?  ?   ?   ?   ?   ? ? ?Home Living Family/patient expects to be discharged to:: Private residence ?Living Arrangements: Alone ?Available Help at Discharge: Family;Available 24 hours/day ?Type of Home: Mobile home ?Home Access: Ramped entrance ?  ?  ?Home Layout: One level ?  ?  ?Bathroom Shower/Tub: Walk-in shower ?  ?Bathroom Toilet: Standard (uses BSC over toilet) ?  ?  ?Home Equipment: Agricultural consultant (2 wheels);Cane - single point;Shower  seat;BSC/3in1 ?  ?  ?  ? ?  ?Prior Functioning/Environment Prior Level of Function : Independent/Modified Independent ?  ?  ?  ? ?  ?  ?OT Problem List: Decreased strength;Pain;Impaired UE functional use;Impaired balance (sitting and/or  standing);Decreased safety awareness;Decreased activity tolerance;Decreased range of motion ?  ?   ?   ?   ?   ? ?Co-evaluation PT/OT/SLP Co-Evaluation/Treatment: Yes ?Reason for Co-Treatment: To address functional/ADL transfers ?  ?OT goals addressed during session: ADL's and self-care;Proper use of Adaptive equipment and DME;Strengthening/ROM ?  ? ?  ?AM-PAC OT "6 Clicks" Daily Activity     ?Outcome Measure Help from another person eating meals?: None ?Help from another person taking care of personal grooming?: None ?Help from another person toileting, which includes using toliet, bedpan, or urinal?: A Lot ?Help from another person bathing (including washing, rinsing, drying)?: A Little ?Help from another person to put on and taking off regular upper body clothing?: None ?Help from another person to put on and taking off regular lower body clothing?: Total ?6 Click Score: 18 ?  ?End of Session Equipment Utilized During Treatment: Gait belt;Rolling walker (2 wheels) ? ?Activity Tolerance: Patient tolerated treatment well ?Patient left: in chair;with call bell/phone within reach;with chair alarm set ? ?OT Visit Diagnosis: History of falling (Z91.81);Muscle weakness (generalized) (M62.81)  ?              ?Time: 3545-6256 ?OT Time Calculation (min): 20 min ?Charges:  OT General Charges ?$OT Visit: 1 Visit ?OT Evaluation ?$OT Eval Low Complexity: 1 Low ? ?Limmie Patricia, OTR/L,CBIS  ?[phone] ? ? ?Franz Svec, Vernona Rieger D ?04/29/2021, 12:07 PM ?

## 2021-04-29 NOTE — Discharge Summary (Signed)
Physician Discharge Summary  ?Valerie Davenport ALP:[id number] DOB: [birth year] DOA: 04/27/2021 ? ?PCP: Lavada Mesi, MD ?Orthopedics: Dr. Dallas Schimke  ? ?Admit date: 04/27/2021 ?Discharge date: 04/29/2021 ? ?Admitted From:  Home  ?Disposition: Home with Home Health  ? ?Recommendations for Outpatient Follow-up:  ?Follow up with PCP in 3 weeks ?Follow up with orthopedics Dr. Dallas Schimke in 2 weeks for wound check and xrays ?Please obtain CBC in 2-3 weeks ?Aspirin 81 mg BID x 6 weeks for DVT prophylaxis then resume once daily ? ?Home Health: PT OT RN AIDE ? ?Discharge Condition: STABLE   ?CODE STATUS: FULL   ?DIET: resume home diet  ? ?Brief Hospitalization Summary: ?Please see all hospital notes, images, labs for full details of the hospitalization. ?88 y.o. female with medical history significant of with history of anxiety, arthritis, hyperlipidemia, hypertension, hypothyroidism, and more presents the ED with a chief complaint of fall.  Unfortunately I am seeing patient after she had morphine, and she is not answering questions appropriately.  For example she asked me if I needed something to sleep in, and told me that she likes what I brought her to sleep and for her birthday.  Granddaughter at bedside reports that patient was trying to open a gate on loose gravel when she slipped on the gravel and fell.  She landed on her left side, hit her head.  The fall was not witnessed, but they do not think she lost consciousness because she took her cane and started hitting the neighbors door to get the neighbor to come out and help her.  Patient does live independently.  She takes care of self but she no longer drives.  She ambulates with either a cane or walker.  She is able to hold a normal conversation recognize people around her.  At the time of my exam she is not recognizing her granddaughter, but she does know that the doctors keep waking her up.  Granddaughter reports the patient has been in her normal state of health until this fall. ?   ?Patient does not smoke, does not drink.  She is vaccinated for COVID.  Granddaughter thinks patient would prefer to be full code. ? ?04/28/2021:  POD#0 s/p CRPP of the Left femoral neck fracture.   ? ?04/29/2021: POD#1 Asa 81 mg BID started for DVT prophylaxis.  Pt ambulated hall with PT and they recommend [facility] services and family agreeable.  Pt seen by ortho and recommended 2 week follow up for xrays and wound check.  DC home with [facility] today.   ? ?HOSPITAL COURSE BY PROBLEM LIST  ? ?Assessment and Plan: ?* Hip fracture (HCC) ?Subcapital femoral neck fracture left ?Postop day #1 s/p repair by Dr. Dallas Schimke ?Pt will do aspirin 81 mg BID x 6 weeks for DVT prophylaxis ?Pt to follow up with Dr. Dallas Schimke in 2 weeks for wound check and Xrays ?DC home with Home health today  ? ?Hypomagnesemia ?--IV replacement ordered and was repleted ? ?Acute metabolic encephalopathy ?-RESOLVED NOW ? ?Fall at home ?-Mechanical fall ?-She did hit her head -CT head and cervical spine showed no acute intracranial process.  No acute fracture or traumatic subluxation of the C-spine.  Mild diffuse atrophy and mild chronic small vessel ischemic changes.  And degenerative changes. ?-Patient is not on blood thinners ?-PT evaluated and will need home health services which is arranged ? ?Leukocytosis ?Reactive, now trending down ?Likely acute phase reactant in the setting of subcapital femoral neck fracture ?No infectious symptoms ? ?Hypothyroid ?Continue  Synthroid ? ?Essential hypertension ?BP uncontrolled in the ED as high as 183/103 ?Now well controlled on home meds, likely was elevated due to acute pain of fracture   ? ? ?Discharge Diagnoses:  ?Principal Problem: ?  Hip fracture (HCC) ?Active Problems: ?  Essential hypertension ?  Hypothyroid ?  Leukocytosis ?  Fall at home ?  Acute metabolic encephalopathy ?  Hypomagnesemia ? ? ?Discharge Instructions: ? ?Allergies as of 04/29/2021   ?No Known Allergies ?  ? ?  ?Medication List  ?  ? ?STOP taking these  medications   ? ?celecoxib 100 MG capsule ?Commonly known as: CeleBREX ?  ?diazepam 2 MG tablet ?Commonly known as: VALIUM ?  ?diclofenac Sodium 1 % Gel ?Commonly known as: Voltaren ?  ?meclizine 25 MG tablet ?Commonly known as: ANTIVERT ?  ?mupirocin ointment 2 % ?Commonly known as: BACTROBAN ?  ?ondansetron 4 MG disintegrating tablet ?Commonly known as: Zofran ODT ?  ?predniSONE 10 MG (21) Tbpk tablet ?Commonly known as: STERAPRED UNI-PAK 21 TAB ?  ?predniSONE 10 MG tablet ?Commonly known as: DELTASONE ?  ? ?  ? ?TAKE these medications   ? ?acetaminophen 325 MG tablet ?Commonly known as: TYLENOL ?Take 2 tablets (650 mg total) by mouth in the morning, at noon, and at bedtime. ?  ?aspirin EC 81 MG tablet ?Swallow whole. Take 1 po BID x 6 weeks, then resume 1 po daily ?What changed:  ?how much to take ?how to take this ?when to take this ?additional instructions ?  ?enalapril 20 MG tablet ?Commonly known as: VASOTEC ?TAKE 1 TABLET (20 MG TOTAL) DAILY. ?What changed: See the new instructions. ?  ?levothyroxine 50 MCG tablet ?Commonly known as: SYNTHROID ?TAKE 1 TABLET (50 MCG TOTAL) BY MOUTH DAILY BEFORE BREAKFAST. ?  ?oxyCODONE 5 MG immediate release tablet ?Commonly known as: Oxy IR/ROXICODONE ?Take 1 tablet (5 mg total) by mouth every 4 (four) hours as needed for up to 5 days for severe pain. ?  ?pantoprazole 40 MG tablet ?Commonly known as: PROTONIX ?Take 1 tablet (40 mg total) by mouth daily at 6 (six) AM. ?Start taking on: April 30, 2021 ?  ?simvastatin 20 MG tablet ?Commonly known as: ZOCOR ?Take 1 tablet (20 mg total) by mouth daily. ?  ? ?  ? ? Follow-up Information   ? ? Oliver Barre, MD. Schedule an appointment as soon as possible for a visit in 2 week(s).   ?Specialties: Orthopedic Surgery, Sports Medicine ?Why: Hospital Follow Up, For wound re-check Xrays ?Contact information: ?[address] [place] [address] ?[phone] ? ? ?  ?  ? ? Care, [facility] Follow up.   ?Specialty: Home Health  Services ?Why: Will contact you to schedule home health visits. ?Contact information: ?[address] ?[address] ?[place] [address] ?[phone] ? ? ?  ?  ? ?  ?  ? ?  ? ?No Known Allergies ?Allergies as of 04/29/2021   ?No Known Allergies ?  ? ?  ?Medication List  ?  ? ?STOP taking these medications   ? ?celecoxib 100 MG capsule ?Commonly known as: CeleBREX ?  ?diazepam 2 MG tablet ?Commonly known as: VALIUM ?  ?diclofenac Sodium 1 % Gel ?Commonly known as: Voltaren ?  ?meclizine 25 MG tablet ?Commonly known as: ANTIVERT ?  ?mupirocin ointment 2 % ?Commonly known as: BACTROBAN ?  ?ondansetron 4 MG disintegrating tablet ?Commonly known as: Zofran ODT ?  ?predniSONE 10 MG (21) Tbpk tablet ?Commonly known as:  STERAPRED UNI-PAK 21 TAB ?  ?predniSONE 10 MG tablet ?Commonly known as: DELTASONE ?  ? ?  ? ?TAKE these medications   ? ?acetaminophen 325 MG tablet ?Commonly known as: TYLENOL ?Take 2 tablets (650 mg total) by mouth in the morning, at noon, and at bedtime. ?  ?aspirin EC 81 MG tablet ?Swallow whole. Take 1 po BID x 6 weeks, then resume 1 po daily ?What changed:  ?how much to take ?how to take this ?when to take this ?additional instructions ?  ?enalapril 20 MG tablet ?Commonly known as: VASOTEC ?TAKE 1 TABLET (20 MG TOTAL) DAILY. ?What changed: See the new instructions. ?  ?levothyroxine 50 MCG tablet ?Commonly known as: SYNTHROID ?TAKE 1 TABLET (50 MCG TOTAL) BY MOUTH DAILY BEFORE BREAKFAST. ?  ?oxyCODONE 5 MG immediate release tablet ?Commonly known as: Oxy IR/ROXICODONE ?Take 1 tablet (5 mg total) by mouth every 4 (four) hours as needed for up to 5 days for severe pain. ?  ?pantoprazole 40 MG tablet ?Commonly known as: PROTONIX ?Take 1 tablet (40 mg total) by mouth daily at 6 (six) AM. ?Start taking on: April 30, 2021 ?  ?simvastatin 20 MG tablet ?Commonly known as: ZOCOR ?Take 1 tablet (20 mg total) by mouth daily. ?  ? ?  ? ? ?Procedures/Studies: ?CT Head Wo Contrast ? ?Result Date: 04/27/2021 ?CLINICAL  DATA:  Fall. EXAM: CT HEAD WITHOUT CONTRAST CT CERVICAL SPINE WITHOUT CONTRAST TECHNIQUE: Multidetector CT imaging of the head and cervical spine was performed following the standard protocol without intraven

## 2021-05-01 ENCOUNTER — Encounter (HOSPITAL_COMMUNITY): Payer: Self-pay | Admitting: Orthopedic Surgery

## 2021-05-02 ENCOUNTER — Telehealth: Payer: Self-pay | Admitting: Radiology

## 2021-05-02 NOTE — Telephone Encounter (Signed)
Phone call from [facility] with [facility].  Asked for verbal orders for 1 x wk x 1 wk, 2 x wk x 3 wk, 1 x wk x 5 wk.  Gave verbal auth for orders.  ?He also says that the dressing had been causing pressure on the incision, so he removed it.  Incision is c & d, no drainage, and steri strips are intact.  Family has gauze and tape to use to cover it if needed.   ?Family is aware to call office with any issues.  ?FYI ?

## 2021-05-06 ENCOUNTER — Other Ambulatory Visit (HOSPITAL_COMMUNITY): Payer: Self-pay | Admitting: Family Medicine

## 2021-05-06 DIAGNOSIS — M545 Low back pain, unspecified: Secondary | ICD-10-CM

## 2021-05-08 ENCOUNTER — Other Ambulatory Visit: Payer: Self-pay

## 2021-05-08 DIAGNOSIS — W19XXXA Unspecified fall, initial encounter: Secondary | ICD-10-CM

## 2021-05-08 DIAGNOSIS — S72002D Fracture of unspecified part of neck of left femur, subsequent encounter for closed fracture with routine healing: Secondary | ICD-10-CM

## 2021-05-08 DIAGNOSIS — Z09 Encounter for follow-up examination after completed treatment for conditions other than malignant neoplasm: Secondary | ICD-10-CM

## 2021-05-14 ENCOUNTER — Encounter: Payer: Self-pay | Admitting: Orthopedic Surgery

## 2021-05-14 ENCOUNTER — Ambulatory Visit (INDEPENDENT_AMBULATORY_CARE_PROVIDER_SITE_OTHER): Payer: Medicare HMO | Admitting: Orthopedic Surgery

## 2021-05-14 ENCOUNTER — Ambulatory Visit: Payer: Medicare HMO

## 2021-05-14 VITALS — Ht <= 58 in | Wt 105.0 lb

## 2021-05-14 DIAGNOSIS — S72002D Fracture of unspecified part of neck of left femur, subsequent encounter for closed fracture with routine healing: Secondary | ICD-10-CM | POA: Diagnosis not present

## 2021-05-14 DIAGNOSIS — Z09 Encounter for follow-up examination after completed treatment for conditions other than malignant neoplasm: Secondary | ICD-10-CM

## 2021-05-14 NOTE — Progress Notes (Signed)
Orthopaedic Postop Note ? ?Assessment: ?Valerie Davenport is a 88 y.o. female s/p CRPP Left femoral neck fracture  ? ?DOS: 04/28/2021 ? ?Plan: ?Sutures trimmed, steri strips placed ?Work with PT, using walker. ?Prescription for new walker provided.  ?Continue with DVT prophylaxis for at least 6 weeks after surgery ?WBAT on the operative extremity ?Follow up in 4 weeks; call with any issues ? ?No orders of the defined types were placed in this encounter. ? ? ? ?Follow-up: ?Return in about 4 weeks (around 06/11/2021). ?XR at next visit: AP pelvis and Left hip ? ?Subjective: ? ?Chief Complaint  ?Patient presents with  ? Routine Post Op  ?  Lt hip DOS 04/28/21    ? ? ?History of Present Illness: ?Valerie Davenport is a 88 y.o. female who presents following the above stated procedure.  Surgery was approximately 2-3 weeks ago.  She was discharged home, with home health.  She is doing well.  She is walking with the assistance of a walker.  She is not complaining of pain.  PT is coming to her house twice weekly, progressing to 3 times per week next week.  She wants to stay at home by herself.  Family is reluctant.  No issues with her incision. ? ?Review of Systems: ?No fevers or chills ?No numbness or tingling ?No Chest Pain ?No shortness of breath ? ? ?Objective: ?Ht 4\' 8"  (1.422 m)   Wt 105 lb (47.6 kg)   BMI 23.54 kg/m?  ? ?Physical Exam: ? ?Alert and oriented.  No acute distress.  Hard of hearing.  Ambulating well with the assistance of a walker. ? ?Surgical incision is healing well.  No surrounding erythema or drainage.  There is some local ecchymosis.  Mild tenderness to palpation laterally.  She is able to maintain a straight leg raise.  She tolerates gentle range of motion of the left hip.  Toes are warm and well-perfused ? ?IMAGING: ?I personally ordered and reviewed the following images: ? ?XR of the Left hip and AP pelvis demonstrates 3 cannulated screws traversing the femoral neck fracture.  No evidence of the fracture  currently.  The screws remain in stable position.  The hip remains reduced.  There is no evidence of implant subsidence.  No acute fractures are noted. ? ?Impression: Left hip in good alignment following CRPP of a femoral neck fracture; no hardware failure  ? ? , MD ?05/14/2021 ?10:58 AM ? ? ?

## 2021-05-14 NOTE — Patient Instructions (Addendum)
Weight bearing as tolerated with walker ?Patient can shower, but no soaking (baths, pools, jacuzzi's) for 2 months. ?No dressings needed after current strips fall off.  ?Recommend waiting on staying alone for at least 2 more weeks, or until reevaluation.  ?

## 2021-06-11 ENCOUNTER — Encounter: Payer: Self-pay | Admitting: Orthopedic Surgery

## 2021-06-11 ENCOUNTER — Ambulatory Visit (INDEPENDENT_AMBULATORY_CARE_PROVIDER_SITE_OTHER): Payer: Medicare HMO | Admitting: Orthopedic Surgery

## 2021-06-11 ENCOUNTER — Ambulatory Visit (INDEPENDENT_AMBULATORY_CARE_PROVIDER_SITE_OTHER): Payer: Medicare HMO

## 2021-06-11 DIAGNOSIS — S72002D Fracture of unspecified part of neck of left femur, subsequent encounter for closed fracture with routine healing: Secondary | ICD-10-CM

## 2021-06-11 NOTE — Progress Notes (Signed)
Orthopaedic Postop Note ? ?Assessment: ?Valerie Davenport is a 88 y.o. female s/p CRPP Left femoral neck fracture  ? ?DOS: 04/28/2021 ? ?Plan: ?She is doing very well. ?Continues to ambulate with the assistance of a walker. ?Continue PT as indicated. ?Walking is excellent therapy, and will continue to improve her strength. ?She stated understanding. ?Medications as needed. ?Follow-up in 6 weeks. ? ? ? ?Follow-up: ?Return in about 6 weeks (around 07/23/2021). ?XR at next visit: AP pelvis and Left hip ? ?Subjective: ? ?Chief Complaint  ?Patient presents with  ? Hip Injury  ?  LT hip follow up w xrays ?DOI 04/28/21  ? ? ?History of Present Illness: ?Valerie Davenport is a 88 y.o. female who presents following the above stated procedure.  Surgery was approximately 6 weeks ago.  She is at home.  She is ambulating with the assistance of a walker.  She has 1 additional visit with home health PT.  No issues with her incisions.  Some irritation overlying the surgical incision. ? ? ?Review of Systems: ?No fevers or chills ?No numbness or tingling ?No Chest Pain ?No shortness of breath ? ? ?Objective: ?There were no vitals taken for this visit. ? ?Physical Exam: ? ?Alert and oriented.  No acute distress.  Hard of hearing.  Ambulating well with the assistance of a walker. ? ?Surgical incisions healing well.  No surrounding erythema or drainage.  Mild tenderness to palpation overlying the incision.  She is able to maintain a straight leg raise.  She tolerates axial loading.  No pain with gentle range of motion of the left hip. ? ?IMAGING: ?I personally ordered and reviewed the following images: ? ?X-ray of the left hip was obtained in clinic today.  This was compared to prior x-rays.  Hardware remains in good position.  No evidence of hardware failure or loosening.  The screws are not backing out.  Femoral neck fracture remains in good alignment.  No acute injuries are noted. ? ?Impression: Healing left femoral neck fracture following closed  reduction and percutaneous pinning. ? ?Mordecai Rasmussen, MD ?06/11/2021 ?10:27 PM ? ? ?

## 2021-06-11 NOTE — Patient Instructions (Signed)
Continue to walk, it is excellent therapy.  Continue to use the walker at all times. ? ?For the discomfort around the incision, I recommend you rub this area gently to desensitize the area. ? ?Follow-up 6 weeks. ?

## 2021-07-03 ENCOUNTER — Encounter: Payer: Self-pay | Admitting: Obstetrics & Gynecology

## 2021-07-31 ENCOUNTER — Other Ambulatory Visit (HOSPITAL_COMMUNITY): Payer: Self-pay | Admitting: Family Medicine

## 2021-07-31 ENCOUNTER — Encounter: Payer: Medicare HMO | Admitting: Orthopedic Surgery

## 2021-07-31 DIAGNOSIS — M545 Low back pain, unspecified: Secondary | ICD-10-CM

## 2021-08-02 ENCOUNTER — Encounter: Payer: Self-pay | Admitting: Orthopedic Surgery

## 2021-08-02 ENCOUNTER — Ambulatory Visit (INDEPENDENT_AMBULATORY_CARE_PROVIDER_SITE_OTHER): Payer: Medicare HMO | Admitting: Orthopedic Surgery

## 2021-08-02 ENCOUNTER — Ambulatory Visit (INDEPENDENT_AMBULATORY_CARE_PROVIDER_SITE_OTHER): Payer: Medicare HMO

## 2021-08-02 VITALS — Ht 64.0 in | Wt 105.0 lb

## 2021-08-02 DIAGNOSIS — Z09 Encounter for follow-up examination after completed treatment for conditions other than malignant neoplasm: Secondary | ICD-10-CM

## 2021-08-02 DIAGNOSIS — S72002D Fracture of unspecified part of neck of left femur, subsequent encounter for closed fracture with routine healing: Secondary | ICD-10-CM | POA: Diagnosis not present

## 2021-08-02 NOTE — Progress Notes (Signed)
Orthopaedic Postop Note  Assessment: Valerie Davenport is a 88 y.o. female s/p CRPP Left femoral neck fracture   DOS: 04/28/2021  Plan: Continues to get stronger. No pain in her hip. Ambulate with a walker, okay to transition to a cane if comfortable. Walking is excellent therapy Consider treatment for onychomycosis of left great toe. Follow-up as needed   Follow-up: Return if symptoms worsen or fail to improve. XR at next visit: [facility] pelvis and Left hip  Subjective:  Chief Complaint  Patient presents with   Routine Post Op    Lt hip DOS 04/28/21    History of Present Illness: Valerie Davenport is a 88 y.o. female who presents following the above stated procedure.  Surgery was approximately 3 months ago.  She is doing well on her own.  She uses her walker most of the time, but states that she will occasionally walk without it at home.  No numbness or tingling.  She states she is having some other issues with her left great toe and this is being treated by another provider.  Review of Systems: No fevers or chills No numbness or tingling No Chest Pain No shortness of breath   Objective: Ht 5\' 4"  (1.626 m)   Wt 105 lb (47.6 kg)   BMI 18.02 kg/m   Physical Exam:  Alert and oriented.  No acute distress.  Hard of hearing.  Ambulating well with the assistance of a walker.  Surgical incision looks good.  No surrounding erythema or drainage.  Mild tenderness to palpation overlying the incision.  She is able to maintain a straight leg raise.  No pain with axial loading.  She tolerates gentle range of motion of the left hip.  She has some tenderness over the dorsum of the great toe, the area of the previous laceration.  She also has some onychomycosis of the great toe.  No drainage.  No redness.  IMAGING: I personally ordered and reviewed the following images:  X-rays of the left hip were obtained in clinic today.  These were compared to prior x-rays.  Fracture remains in good alignment.   No evidence of hardware failure or loosening.  Screws are not backing out.  Pain is reduced.  No acute injuries noted.  Impression: Healing left femoral neck fracture following operative fixation.  , MD 08/02/2021 3:32 PM

## 2021-08-14 ENCOUNTER — Ambulatory Visit (HOSPITAL_COMMUNITY)
Admission: RE | Admit: 2021-08-14 | Discharge: 2021-08-14 | Disposition: A | Discharge status: Home / Self Care | Payer: Medicare HMO | Source: Ambulatory Visit | Attending: Family Medicine | Admitting: Family Medicine

## 2021-08-14 ENCOUNTER — Inpatient Hospital Stay (HOSPITAL_COMMUNITY): Admission: RE | Admit: 2021-08-14 | Payer: Medicare HMO | Source: Ambulatory Visit

## 2021-08-14 ENCOUNTER — Encounter (HOSPITAL_COMMUNITY): Payer: Self-pay

## 2021-08-14 ENCOUNTER — Ambulatory Visit (HOSPITAL_COMMUNITY): Payer: Medicare HMO

## 2021-08-14 DIAGNOSIS — Z1231 Encounter for screening mammogram for malignant neoplasm of breast: Secondary | ICD-10-CM

## 2021-08-14 DIAGNOSIS — M25552 Pain in left hip: Secondary | ICD-10-CM | POA: Diagnosis not present

## 2021-08-14 DIAGNOSIS — M545 Low back pain, unspecified: Secondary | ICD-10-CM | POA: Insufficient documentation

## 2021-08-22 ENCOUNTER — Ambulatory Visit: Payer: Medicare HMO | Admitting: Obstetrics & Gynecology

## 2021-08-26 ENCOUNTER — Encounter: Payer: Self-pay | Admitting: Obstetrics & Gynecology

## 2021-08-26 ENCOUNTER — Ambulatory Visit: Payer: Medicare HMO | Admitting: Obstetrics & Gynecology

## 2021-08-26 VITALS — BP 181/90 | HR 82

## 2021-08-26 DIAGNOSIS — Z4689 Encounter for fitting and adjustment of other specified devices: Secondary | ICD-10-CM | POA: Diagnosis not present

## 2021-08-26 DIAGNOSIS — N819 Female genital prolapse, unspecified: Secondary | ICD-10-CM | POA: Diagnosis not present

## 2021-08-26 NOTE — Progress Notes (Signed)
Chief Complaint  Patient presents with   Pessary Check    Blood pressure (!) 181/90, pulse 82.  Valerie Davenport presents today for routine follow up related to her pessary.   She uses a Milex ring with support #5 She reports no vaginal discharge and no vaginal bleeding   Likert scale(1 not bothersome -5 very bothersome)  :  1  Exam reveals no undue vaginal mucosal pressure of breakdown, no discharge and no vaginal bleeding.  Vaginal Epithelial Abnormality Classification System:   0 0    No abnormalities 1    Epithelial erythema 2    Granulation tissue 3    Epithelial break or erosion, 1 cm or less 4    Epithelial break or erosion, 1 cm or greater  The pessary is removed, cleaned and replaced without difficulty.      ICD-10-CM   1. Pessary maintenance, Milex ring with support #5, placed 01/2018  Z46.89     2. 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  Lazaro Arms, MD  08/26/2021 10:48 AM

## 2021-12-26 ENCOUNTER — Ambulatory Visit: Payer: Medicare HMO | Admitting: Obstetrics & Gynecology

## 2021-12-27 ENCOUNTER — Encounter: Payer: Self-pay | Admitting: Obstetrics & Gynecology

## 2021-12-27 ENCOUNTER — Ambulatory Visit: Payer: Medicare HMO | Admitting: Obstetrics & Gynecology

## 2021-12-27 VITALS — BP 142/84 | HR 93

## 2021-12-27 DIAGNOSIS — N819 Female genital prolapse, unspecified: Secondary | ICD-10-CM

## 2021-12-27 DIAGNOSIS — Z4689 Encounter for fitting and adjustment of other specified devices: Secondary | ICD-10-CM

## 2022-02-01 NOTE — Progress Notes (Signed)
Chief Complaint  Patient presents with   Pessary Check    Blood pressure (!) 142/84, pulse 93.  Valerie Davenport presents today for routine follow up related to her pessary.   She uses a Milex ring with support #5 She reports no vaginal discharge and no vaginal bleeding   Likert scale(1 not bothersome -5 very bothersome)  :  1  Exam reveals no undue vaginal mucosal pressure of breakdown, no discharge and no vaginal bleeding.  Vaginal Epithelial Abnormality Classification System:   0 0    No abnormalities 1    Epithelial erythema 2    Granulation tissue 3    Epithelial break or erosion, 1 cm or less 4    Epithelial break or erosion, 1 cm or greater  The pessary is removed, cleaned and replaced without difficulty.      ICD-10-CM   1. Pessary maintenance, Milex ring with support #5, placed 01/2018  Z46.89     2. 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  Florian Buff, MD  02/01/2022 7:37 AM

## 2022-02-19 ENCOUNTER — Other Ambulatory Visit (HOSPITAL_COMMUNITY): Payer: Self-pay | Admitting: Family Medicine

## 2022-02-19 DIAGNOSIS — Z1231 Encounter for screening mammogram for malignant neoplasm of breast: Secondary | ICD-10-CM

## 2022-02-26 ENCOUNTER — Ambulatory Visit (HOSPITAL_COMMUNITY)
Admission: RE | Admit: 2022-02-26 | Discharge: 2022-02-26 | Disposition: A | Discharge status: Home / Self Care | Payer: Medicare HMO | Source: Ambulatory Visit | Attending: Family Medicine | Admitting: Family Medicine

## 2022-02-26 DIAGNOSIS — Z1231 Encounter for screening mammogram for malignant neoplasm of breast: Secondary | ICD-10-CM

## 2022-02-27 ENCOUNTER — Ambulatory Visit: Payer: Medicare HMO | Admitting: Obstetrics & Gynecology

## 2022-02-28 ENCOUNTER — Encounter: Payer: Self-pay | Admitting: Obstetrics & Gynecology

## 2022-02-28 ENCOUNTER — Ambulatory Visit: Payer: Medicare HMO | Admitting: Obstetrics & Gynecology

## 2022-02-28 VITALS — BP 139/89 | HR 84

## 2022-02-28 DIAGNOSIS — Z4689 Encounter for fitting and adjustment of other specified devices: Secondary | ICD-10-CM | POA: Diagnosis not present

## 2022-02-28 DIAGNOSIS — N819 Female genital prolapse, unspecified: Secondary | ICD-10-CM | POA: Diagnosis not present

## 2022-02-28 NOTE — Progress Notes (Signed)
Chief Complaint  Patient presents with   Pessary Check    Blood pressure 139/89, pulse 84.  Valerie Davenport presents today for routine follow up related to her pessary.   She uses a Milex ring with support #5 She reports no vaginal discharge and no vaginal bleeding   Likert scale(1 not bothersome -5 very bothersome)  :  1  Exam reveals no undue vaginal mucosal pressure of breakdown, no discharge and no vaginal bleeding.  Vaginal Epithelial Abnormality Classification System:   0 0    No abnormalities 1    Epithelial erythema 2    Granulation tissue 3    Epithelial break or erosion, 1 cm or less 4    Epithelial break or erosion, 1 cm or greater  The pessary is removed, cleaned and replaced without difficulty.      ICD-10-CM   1. Pessary maintenance, Milex ring with support #5, placed 01/2018  Z46.89     2. 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  Florian Buff, MD  02/28/2022 12:36 PM

## 2022-03-14 ENCOUNTER — Emergency Department (HOSPITAL_COMMUNITY): Payer: Medicare HMO

## 2022-03-14 ENCOUNTER — Other Ambulatory Visit: Payer: Self-pay

## 2022-03-14 ENCOUNTER — Encounter (HOSPITAL_COMMUNITY): Payer: Self-pay

## 2022-03-14 ENCOUNTER — Emergency Department (HOSPITAL_COMMUNITY)
Admission: EM | Admit: 2022-03-14 | Discharge: 2022-03-15 | Disposition: A | Discharge status: Home / Self Care | Payer: Medicare HMO | Attending: Emergency Medicine | Admitting: Emergency Medicine

## 2022-03-14 DIAGNOSIS — I10 Essential (primary) hypertension: Secondary | ICD-10-CM | POA: Insufficient documentation

## 2022-03-14 DIAGNOSIS — W19XXXA Unspecified fall, initial encounter: Secondary | ICD-10-CM

## 2022-03-14 DIAGNOSIS — Z96642 Presence of left artificial hip joint: Secondary | ICD-10-CM | POA: Diagnosis not present

## 2022-03-14 DIAGNOSIS — E039 Hypothyroidism, unspecified: Secondary | ICD-10-CM | POA: Diagnosis not present

## 2022-03-14 DIAGNOSIS — S0990XA Unspecified injury of head, initial encounter: Secondary | ICD-10-CM | POA: Insufficient documentation

## 2022-03-14 DIAGNOSIS — M25552 Pain in left hip: Secondary | ICD-10-CM | POA: Diagnosis not present

## 2022-03-14 DIAGNOSIS — M542 Cervicalgia: Secondary | ICD-10-CM | POA: Diagnosis not present

## 2022-03-14 DIAGNOSIS — M545 Low back pain, unspecified: Secondary | ICD-10-CM

## 2022-03-14 DIAGNOSIS — T1490XA Injury, unspecified, initial encounter: Secondary | ICD-10-CM | POA: Diagnosis not present

## 2022-03-14 DIAGNOSIS — S72002A Fracture of unspecified part of neck of left femur, initial encounter for closed fracture: Secondary | ICD-10-CM | POA: Diagnosis not present

## 2022-03-14 MED ORDER — ACETAMINOPHEN 500 MG PO TABS
1000.0000 mg | ORAL_TABLET | Freq: Once | ORAL | Status: AC
  Administered 2022-03-14: 1000 mg via ORAL
  Filled 2022-03-14: qty 2

## 2022-03-14 NOTE — ED Triage Notes (Signed)
Pt fell from standing onto a coffee table and then to the carpet floor. Denies any loss of consciousness, is hurting on the left hip down the left leg.

## 2022-03-15 ENCOUNTER — Emergency Department (HOSPITAL_COMMUNITY): Payer: Medicare HMO

## 2022-03-15 DIAGNOSIS — M545 Low back pain, unspecified: Secondary | ICD-10-CM | POA: Diagnosis not present

## 2022-03-15 DIAGNOSIS — T1490XA Injury, unspecified, initial encounter: Secondary | ICD-10-CM | POA: Diagnosis not present

## 2022-03-15 DIAGNOSIS — S72002A Fracture of unspecified part of neck of left femur, initial encounter for closed fracture: Secondary | ICD-10-CM | POA: Diagnosis not present

## 2022-03-15 NOTE — Discharge Instructions (Signed)
You were evaluated in the Emergency Department and after careful evaluation, we did not find any emergent condition requiring admission or further testing in the hospital.  Your exam/testing today is overall reassuring.  Recommend Tylenol at home for pain as needed.  Please return to the Emergency Department if you experience any worsening of your condition.   Thank you for allowing [place] to be a part of your care.

## 2022-03-15 NOTE — ED Provider Notes (Signed)
[facility] Emergency Department Provider Note MRN:  [id number]  Arrival date & time: 03/15/22     Chief Complaint   Fall   History of Present Illness   Valerie Davenport is a 89 y.o. year-old female with a history of hip replacement presenting to the ED with chief complaint of fall.  Patient explains that her big toe is a bit rolled up and sometimes it causes her to trip and fall.  This happened this evening and she fell onto her left hip.  Also hit her head.  Endorsing neck pain, did not lose consciousness.  No back pain.  Hurting the worst in the left hip thigh area.  No abdominal pain, no chest pain or shortness of breath.  Review of Systems  A thorough review of systems was obtained and all systems are negative except as noted in the HPI and PMH.   Patient's Health History    Past Medical History:  Diagnosis Date   Anxiety    but doesn't require meds   Arthritis    Chronic back pain    HNP-takes Tramadol and Pain Pill(at night)   History of blood transfusion    no abnormal reaction noted   History of migraine    last one about 73yr ago   History of shingles    Hyperlipidemia    takes Simvastatin daily   Hypertension    takes Enalapril and Chlorthalidone daily   Hypothyroidism    takes Levothyroxine daily   Joint pain    Nocturia     Past Surgical History:  Procedure Laterality Date   ABDOMINAL HYSTERECTOMY     BACK SURGERY     bilateral cataract surgery     COLONOSCOPY     HIP PINNING,CANNULATED Left 04/28/2021   Procedure: CANNULATED HIP PINNING;  Surgeon: CMordecai Rasmussen MD;  Location: [facility];  Service: Orthopedics;  Laterality: Left;   LUMBAR LAMINECTOMY  02/20/2012   LUMBAR LAMINECTOMY  02/20/2012   Procedure: MICRODISCECTOMY LUMBAR LAMINECTOMY;  Surgeon: MMarybelle Killings MD;  [place] [facility]  Service: Orthopedics;  Laterality: N/A;  Right L4 hemilaminectomy, microdiscectomy, removal of free fragment    Family History  Problem Relation  Age of Onset   Hypertension Other    Liver cancer Daughter    Heart disease Daughter    Other Son        drowning   COPD Son    Breast cancer Neg Hx     Social History   Socioeconomic History   Marital status: Widowed    Spouse name: Not on file   Number of children: Not on file   Years of education: Not on file   Highest education level: Not on file  Occupational History   Not on file  Tobacco Use   Smoking status: Never   Smokeless tobacco: Never  Vaping Use   Vaping Use: Never used  Substance and Sexual Activity   Alcohol use: No   Drug use: No   Sexual activity: Not Currently    Birth control/protection: Surgical    Comment: hyst  Other Topics Concern   Not on file  Social History Narrative   Not on file   Social Determinants of Health   Financial Resource Strain: Not on file  Food Insecurity: Not on file  Transportation Needs: Not on file  Physical Activity: Not on file  Stress: Not on file  Social Connections: Not on file  Intimate Partner Violence: Not on file  Physical Exam   Vitals:   03/14/22 2210 03/15/22 0214  BP: (!) 155/91 (!) 166/97  Pulse: 91 84  Resp: 16 14  Temp: 98 F (36.7 C) 98.2 F (36.8 C)  SpO2: 97% 96%    CONSTITUTIONAL: Chronically ill-appearing, NAD NEURO/PSYCH:  Alert and oriented x 3, no focal deficits EYES:  eyes equal and reactive ENT/NECK:  no LAD, no JVD CARDIO: Regular rate, well-perfused, normal S1 and S2 PULM:  CTAB no wheezing or rhonchi GI/GU:  non-distended, non-tender MSK/SPINE:  No gross deformities, no edema SKIN:  no rash, atraumatic   *Additional and/or pertinent findings included in MDM below  Diagnostic and Interventional Summary    EKG Interpretation  Date/Time:    Ventricular Rate:    PR Interval:    QRS Duration:   QT Interval:    QTC Calculation:   R Axis:     Text Interpretation:         Labs Reviewed - No data to display  DG Hip Unilat W or Wo Pelvis 2-3 Views Left  Final  Result    DG Femur Min 2 Views Left  Final Result    DG Knee Complete 4 Views Left  Final Result    DG Lumbar Spine 2-3 Views  Final Result    CT HEAD WO CONTRAST (5MM)  Final Result    CT CERVICAL SPINE WO CONTRAST  Final Result      Medications  acetaminophen (TYLENOL) tablet 1,000 mg (1,000 mg Oral Given 03/14/22 2336)     Procedures  /  Critical Care Procedures  ED Course and Medical Decision Making  Initial Impression and Ddx Decreased range of motion of the left hip due to pain, DDx includes periprosthetic fracture.  Head trauma, advanced age, will rule out intracranial bleeding as well.  Past medical/surgical history that increases complexity of ED encounter: Dementia  Interpretation of Diagnostics I personally reviewed the hip x-ray and my interpretation is as follows: No fracture  CTs and x-rays are without acute or emergent process  Patient Reassessment and Ultimate Disposition/Management     Patient is appropriate for discharge.  Patient management required discussion with the following services or consulting groups:  None  Complexity of Problems Addressed Acute illness or injury that poses threat of life of bodily function  Additional Data Reviewed and Analyzed Further history obtained from: Further history from spouse/family member  Additional Factors Impacting ED Encounter Risk None  Barth Kirks. Sedonia Small, [place] [email or web address]  Final Clinical Impressions(s) / ED Diagnoses     ICD-10-CM   1. Fall, initial encounter  W19.XXXA     2. Low back pain, unspecified back pain laterality, unspecified chronicity, unspecified whether sciatica present  M54.50 DG Lumbar Spine 2-3 Views    DG Lumbar Spine 2-3 Views    3. Pain of left hip  M25.552       ED Discharge Orders     None        Discharge Instructions Discussed with and Provided to Patient:     Discharge Instructions       You were evaluated in the Emergency Department and after careful evaluation, we did not find any emergent condition requiring admission or further testing in the hospital.  Your exam/testing today is overall reassuring.  Recommend Tylenol at home for pain as needed.  Please return to the Emergency Department if you experience any worsening of your condition.   Thank you for allowing [place]  to be a part of your care.       Maudie Flakes, MD 03/15/22 0230

## 2022-03-27 ENCOUNTER — Other Ambulatory Visit (HOSPITAL_COMMUNITY): Payer: Self-pay | Admitting: Family Medicine

## 2022-03-27 ENCOUNTER — Encounter: Payer: Self-pay | Admitting: Radiology

## 2022-03-27 DIAGNOSIS — R059 Cough, unspecified: Secondary | ICD-10-CM

## 2022-03-27 DIAGNOSIS — M25552 Pain in left hip: Secondary | ICD-10-CM

## 2022-03-28 ENCOUNTER — Ambulatory Visit (HOSPITAL_COMMUNITY)
Admission: RE | Admit: 2022-03-28 | Discharge: 2022-03-28 | Disposition: A | Discharge status: Home / Self Care | Payer: Medicare HMO | Source: Ambulatory Visit | Attending: Family Medicine | Admitting: Family Medicine

## 2022-03-28 DIAGNOSIS — M25552 Pain in left hip: Secondary | ICD-10-CM | POA: Diagnosis not present

## 2022-03-28 DIAGNOSIS — S72002A Fracture of unspecified part of neck of left femur, initial encounter for closed fracture: Secondary | ICD-10-CM | POA: Diagnosis not present

## 2022-03-28 DIAGNOSIS — J439 Emphysema, unspecified: Secondary | ICD-10-CM | POA: Diagnosis not present

## 2022-03-28 DIAGNOSIS — R059 Cough, unspecified: Secondary | ICD-10-CM | POA: Diagnosis not present

## 2022-03-28 DIAGNOSIS — J449 Chronic obstructive pulmonary disease, unspecified: Secondary | ICD-10-CM | POA: Diagnosis not present

## 2022-03-28 DIAGNOSIS — R079 Chest pain, unspecified: Secondary | ICD-10-CM | POA: Diagnosis not present

## 2022-07-04 ENCOUNTER — Ambulatory Visit: Payer: Medicare HMO | Admitting: Obstetrics & Gynecology

## 2022-07-11 ENCOUNTER — Ambulatory Visit: Payer: Medicare HMO | Admitting: Obstetrics & Gynecology

## 2022-07-11 ENCOUNTER — Encounter: Payer: Self-pay | Admitting: Obstetrics & Gynecology

## 2022-07-11 VITALS — BP 139/89 | HR 84

## 2022-07-11 DIAGNOSIS — M21372 Foot drop, left foot: Secondary | ICD-10-CM | POA: Diagnosis not present

## 2022-07-11 DIAGNOSIS — E039 Hypothyroidism, unspecified: Secondary | ICD-10-CM | POA: Diagnosis not present

## 2022-07-11 DIAGNOSIS — E782 Mixed hyperlipidemia: Secondary | ICD-10-CM | POA: Diagnosis not present

## 2022-07-11 DIAGNOSIS — I10 Essential (primary) hypertension: Secondary | ICD-10-CM | POA: Diagnosis not present

## 2022-07-11 DIAGNOSIS — Z4689 Encounter for fitting and adjustment of other specified devices: Secondary | ICD-10-CM

## 2022-07-11 DIAGNOSIS — N819 Female genital prolapse, unspecified: Secondary | ICD-10-CM | POA: Diagnosis not present

## 2022-07-11 DIAGNOSIS — R42 Dizziness and giddiness: Secondary | ICD-10-CM | POA: Diagnosis not present

## 2022-07-11 DIAGNOSIS — D649 Anemia, unspecified: Secondary | ICD-10-CM | POA: Diagnosis not present

## 2022-07-11 NOTE — Progress Notes (Signed)
Chief Complaint  Patient presents with   Pessary Check    Blood pressure 139/89, pulse 84.  Valerie Davenport presents today for routine follow up related to her pessary.   She uses a Milex ring with support #5 She reports no vaginal discharge and no vaginal bleeding   Likert scale(1 not bothersome -5 very bothersome)  :  1  Exam reveals no undue vaginal mucosal pressure of breakdown, no discharge and no vaginal bleeding.  Vaginal Epithelial Abnormality Classification System:   0 0    No abnormalities 1    Epithelial erythema 2    Granulation tissue 3    Epithelial break or erosion, 1 cm or less 4    Epithelial break or erosion, 1 cm or greater  The pessary is removed, cleaned and replaced without difficulty.      ICD-10-CM   1. Pessary maintenance, Milex ring with support #5, placed 01/2018  Z46.89     2. 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  Lazaro Arms, MD  07/11/2022 10:37 AM

## 2022-08-29 ENCOUNTER — Emergency Department (HOSPITAL_COMMUNITY): Payer: Medicare HMO

## 2022-08-29 ENCOUNTER — Encounter (HOSPITAL_COMMUNITY): Payer: Self-pay

## 2022-08-29 ENCOUNTER — Emergency Department (HOSPITAL_COMMUNITY)
Admission: EM | Admit: 2022-08-29 | Discharge: 2022-08-30 | Disposition: A | Discharge status: Home / Self Care | Payer: Medicare HMO | Attending: Emergency Medicine | Admitting: Emergency Medicine

## 2022-08-29 ENCOUNTER — Other Ambulatory Visit: Payer: Self-pay

## 2022-08-29 DIAGNOSIS — F0390 Unspecified dementia without behavioral disturbance: Secondary | ICD-10-CM | POA: Insufficient documentation

## 2022-08-29 DIAGNOSIS — S199XXA Unspecified injury of neck, initial encounter: Secondary | ICD-10-CM | POA: Diagnosis not present

## 2022-08-29 DIAGNOSIS — G9389 Other specified disorders of brain: Secondary | ICD-10-CM | POA: Diagnosis not present

## 2022-08-29 DIAGNOSIS — W19XXXA Unspecified fall, initial encounter: Secondary | ICD-10-CM | POA: Insufficient documentation

## 2022-08-29 DIAGNOSIS — R9089 Other abnormal findings on diagnostic imaging of central nervous system: Secondary | ICD-10-CM | POA: Diagnosis not present

## 2022-08-29 DIAGNOSIS — E039 Hypothyroidism, unspecified: Secondary | ICD-10-CM | POA: Insufficient documentation

## 2022-08-29 DIAGNOSIS — R918 Other nonspecific abnormal finding of lung field: Secondary | ICD-10-CM | POA: Diagnosis not present

## 2022-08-29 DIAGNOSIS — R410 Disorientation, unspecified: Secondary | ICD-10-CM | POA: Diagnosis not present

## 2022-08-29 DIAGNOSIS — S3991XA Unspecified injury of abdomen, initial encounter: Secondary | ICD-10-CM | POA: Diagnosis not present

## 2022-08-29 DIAGNOSIS — R4182 Altered mental status, unspecified: Secondary | ICD-10-CM | POA: Diagnosis not present

## 2022-08-29 DIAGNOSIS — R799 Abnormal finding of blood chemistry, unspecified: Secondary | ICD-10-CM | POA: Diagnosis not present

## 2022-08-29 DIAGNOSIS — I10 Essential (primary) hypertension: Secondary | ICD-10-CM | POA: Insufficient documentation

## 2022-08-29 DIAGNOSIS — N858 Other specified noninflammatory disorders of uterus: Secondary | ICD-10-CM | POA: Diagnosis not present

## 2022-08-29 DIAGNOSIS — M5126 Other intervertebral disc displacement, lumbar region: Secondary | ICD-10-CM | POA: Diagnosis not present

## 2022-08-29 DIAGNOSIS — S0990XA Unspecified injury of head, initial encounter: Secondary | ICD-10-CM | POA: Diagnosis not present

## 2022-08-29 DIAGNOSIS — Z90710 Acquired absence of both cervix and uterus: Secondary | ICD-10-CM | POA: Diagnosis not present

## 2022-08-29 DIAGNOSIS — M549 Dorsalgia, unspecified: Secondary | ICD-10-CM | POA: Insufficient documentation

## 2022-08-29 DIAGNOSIS — M47816 Spondylosis without myelopathy or radiculopathy, lumbar region: Secondary | ICD-10-CM | POA: Diagnosis not present

## 2022-08-29 LAB — CBC WITH DIFFERENTIAL/PLATELET
Abs Immature Granulocytes: 0.06 10*3/uL (ref 0.00–0.07)
Basophils Absolute: 0 10*3/uL (ref 0.0–0.1)
Basophils Relative: 0 %
Eosinophils Absolute: 0 10*3/uL (ref 0.0–0.5)
Eosinophils Relative: 0 %
HCT: 42.3 % (ref 36.0–46.0)
Hemoglobin: 14.2 g/dL (ref 12.0–15.0)
Immature Granulocytes: 1 %
Lymphocytes Relative: 12 %
Lymphs Abs: 1.6 10*3/uL (ref 0.7–4.0)
MCH: 30.5 pg (ref 26.0–34.0)
MCHC: 33.6 g/dL (ref 30.0–36.0)
MCV: 90.8 fL (ref 80.0–100.0)
Monocytes Absolute: 1.3 10*3/uL — ABNORMAL HIGH (ref 0.1–1.0)
Monocytes Relative: 10 %
Neutro Abs: 10.2 10*3/uL — ABNORMAL HIGH (ref 1.7–7.7)
Neutrophils Relative %: 77 %
Platelets: 274 10*3/uL (ref 150–400)
RBC: 4.66 MIL/uL (ref 3.87–5.11)
RDW: 13.4 % (ref 11.5–15.5)
WBC: 13.1 10*3/uL — ABNORMAL HIGH (ref 4.0–10.5)
nRBC: 0 % (ref 0.0–0.2)

## 2022-08-29 LAB — MAGNESIUM: Magnesium: 1.5 mg/dL — ABNORMAL LOW (ref 1.7–2.4)

## 2022-08-29 LAB — COMPREHENSIVE METABOLIC PANEL
ALT: 20 U/L (ref 0–44)
AST: 31 U/L (ref 15–41)
Albumin: 3.9 g/dL (ref 3.5–5.0)
Alkaline Phosphatase: 72 U/L (ref 38–126)
Anion gap: 10 (ref 5–15)
BUN: 22 mg/dL (ref 8–23)
CO2: 25 mmol/L (ref 22–32)
Calcium: 9.4 mg/dL (ref 8.9–10.3)
Chloride: 99 mmol/L (ref 98–111)
Creatinine, Ser: 1.16 mg/dL — ABNORMAL HIGH (ref 0.44–1.00)
GFR, Estimated: 45 mL/min — ABNORMAL LOW (ref >60)
Glucose, Bld: 114 mg/dL — ABNORMAL HIGH (ref 70–99)
Potassium: 3.3 mmol/L — ABNORMAL LOW (ref 3.5–5.1)
Sodium: 134 mmol/L — ABNORMAL LOW (ref 135–145)
Total Bilirubin: 1.6 mg/dL — ABNORMAL HIGH (ref 0.3–1.2)
Total Protein: 7 g/dL (ref 6.5–8.1)

## 2022-08-29 LAB — CBG MONITORING, ED: Glucose-Capillary: 107 mg/dL — ABNORMAL HIGH (ref 70–99)

## 2022-08-29 LAB — CK: Total CK: 366 U/L — ABNORMAL HIGH (ref 38–234)

## 2022-08-29 NOTE — ED Notes (Signed)
Pt returned from CT °

## 2022-08-29 NOTE — ED Triage Notes (Signed)
Found on the floor by neighbor at 12:30 today. Son told neighbor not to call 911 14:30 granddaughter talked to pt on the phone and noted that pt was confused.   Pt is confused when talking. Granddaughter stated that the last time she talked to her and seh was normal was last night at 8:45. Pt complains of cough x2 weeks  Denies fever Pt stated that that se had a "really bad bowel movement today and I just couldn't walk" Pt complains of LEFT flank pain   Family denies blood thinner No trauma noted to head.   BGL in triage 107

## 2022-08-29 NOTE — ED Provider Notes (Signed)
[facility] Emergency Department Provider Note MRN:  [id number]  Arrival date & time: 08/30/22     Chief Complaint   Fall   History of Present Illness   Valerie Davenport is a 89 y.o. year-old female with history of dementia, TIA presenting to the ED with chief complaint of fall.  Fall today, unwitnessed, potentially on the ground for a while.  Has been acting much more confused than normal over the past several hours.  No recent illness or fever.  Endorsing back pain, was endorsing flank pain earlier today.  Review of Systems  A thorough review of systems was obtained and all systems are negative except as noted in the HPI and PMH.   Patient's Health History    Past Medical History:  Diagnosis Date   Anxiety    but doesn't require meds   Arthritis    Chronic back pain    HNP-takes Tramadol and Pain Pill(at night)   History of blood transfusion    no abnormal reaction noted   History of migraine    last one about 77yrs ago   History of shingles    Hyperlipidemia    takes Simvastatin daily   Hypertension    takes Enalapril and Chlorthalidone daily   Hypothyroidism    takes Levothyroxine daily   Joint pain    Nocturia     Past Surgical History:  Procedure Laterality Date   ABDOMINAL HYSTERECTOMY     BACK SURGERY     bilateral cataract surgery     COLONOSCOPY     HIP PINNING,CANNULATED Left 04/28/2021   Procedure: CANNULATED HIP PINNING;  Surgeon: Oliver Barre, MD;  Location: [facility];  Service: Orthopedics;  Laterality: Left;   LUMBAR LAMINECTOMY  02/20/2012   LUMBAR LAMINECTOMY  02/20/2012   Procedure: MICRODISCECTOMY LUMBAR LAMINECTOMY;  Surgeon: Eldred Manges, MD;  Location[facility];  Service: Orthopedics;  Laterality: N/A;  Right L4 hemilaminectomy, microdiscectomy, removal of free fragment    Family History  Problem Relation Age of Onset   Hypertension Other    Liver cancer Daughter    Heart disease Daughter    Other Son        drowning    COPD Son    Breast cancer Neg Hx     Social History   Socioeconomic History   Marital status: Widowed    Spouse name: Not on file   Number of children: Not on file   Years of education: Not on file   Highest education level: Not on file  Occupational History   Not on file  Tobacco Use   Smoking status: Never   Smokeless tobacco: Never  Vaping Use   Vaping status: Never Used  Substance and Sexual Activity   Alcohol use: No   Drug use: No   Sexual activity: Not Currently    Birth control/protection: Surgical    Comment: hyst  Other Topics Concern   Not on file  Social History Narrative   Not on file   Social Determinants of Health   Financial Resource Strain: Not on file  Food Insecurity: Not on file  Transportation Needs: Not on file  Physical Activity: Not on file  Stress: Not on file  Social Connections: Not on file  Intimate Partner Violence: Not on file     Physical Exam   Vitals:   08/30/22 0130 08/30/22 0145  BP:  113/61  Pulse: 76 79  Resp: 13 19  Temp:  SpO2: 97% 96%    CONSTITUTIONAL: Well-appearing, NAD NEURO/PSYCH: Awake and alert, oriented to name, moves all extremities equally EYES:  eyes equal and reactive ENT/NECK:  no LAD, no JVD CARDIO: Regular rate, well-perfused, normal S1 and S2 PULM:  CTAB no wheezing or rhonchi GI/GU:  non-distended, non-tender MSK/SPINE:  No gross deformities, no edema SKIN:  no rash, atraumatic   *Additional and/or pertinent findings included in MDM below  Diagnostic and Interventional Summary    EKG Interpretation Date/Time:  Friday August 29 2022 21:27:35 EDT Ventricular Rate:  91 PR Interval:  116 QRS Duration:  110 QT Interval:  418 QTC Calculation: 514 R Axis:   110  Text Interpretation: Normal sinus rhythm Right bundle branch block Abnormal ECG When compared with ECG of 27-Apr-2021 18:48, PREVIOUS ECG IS PRESENT Confirmed by Kennis Carina [phone]) on 08/29/2022 11:05:04 PM       Labs Reviewed   CBC WITH DIFFERENTIAL/PLATELET - Abnormal; Notable for the following components:      Result Value   WBC 13.1 (*)    Neutro Abs 10.2 (*)    Monocytes Absolute 1.3 (*)    All other components within normal limits  COMPREHENSIVE METABOLIC PANEL - Abnormal; Notable for the following components:   Sodium 134 (*)    Potassium 3.3 (*)    Glucose, Bld 114 (*)    Creatinine, Ser 1.16 (*)    Total Bilirubin 1.6 (*)    GFR, Estimated 45 (*)    All other components within normal limits  MAGNESIUM - Abnormal; Notable for the following components:   Magnesium 1.5 (*)    All other components within normal limits  CK - Abnormal; Notable for the following components:   Total CK 366 (*)    All other components within normal limits  CBG MONITORING, ED - Abnormal; Notable for the following components:   Glucose-Capillary 107 (*)    All other components within normal limits  URINE CULTURE  URINALYSIS, ROUTINE W REFLEX MICROSCOPIC    CT HEAD WO CONTRAST ( )  Final Result    CT CERVICAL SPINE WO CONTRAST  Final Result    CT ABDOMEN PELVIS WO CONTRAST  Final Result    CT L-SPINE NO CHARGE  Final Result    DG Chest 2 View  Final Result      Medications - No data to display   Procedures  /  Critical Care Procedures  ED Course and Medical Decision Making  Initial Impression and Ddx Differential diagnosis includes subdural hematoma, UTI, electrolyte disturbance, waxing and waning dementia.  Past medical/surgical history that increases complexity of ED encounter: Dementia  Interpretation of Diagnostics I personally reviewed the Chest Xray and my interpretation is as follows: Normal, no pneumothorax  Labs reassuring with no significant blood count or electrolyte disturbance, very mild hypomagnesemia, very mild CK elevation.  CT imaging is without acute traumatic injury.  There is a large solid mass at the location of the uterus which was previously removed via hysterectomy.  Patient  Reassessment and Ultimate Disposition/Management     Patient reassessed tenderness still well-appearing with normal vital signs.  Family would like to bring her home.  Mental status is still slightly off from baseline but not much, they are comfortable taking care of her and keeping a close eye on her.  Overall this seems appropriate as there is not an obvious sign for admission at this time.  Abdomen completely soft and nontender, she has follow-up with OB/GYN.  No fever or  suprapubic tenderness to suggest UTI.  Patient management required discussion with the following services or consulting groups:  None  Complexity of Problems Addressed Acute illness or injury that poses threat of life of bodily function  Additional Data Reviewed and Analyzed Further history obtained from: Further history from spouse/family member  Additional Factors Impacting ED Encounter Risk Consideration of hospitalization  Elmer Sow. Pilar Plate, MD [facility] Emergency Medicine [facility] [email or web address]  Final Clinical Impressions(s) / ED Diagnoses     ICD-10-CM   1. Fall, initial encounter  W19.XXXA     2. Confusion  R41.0     3. Uterine mass  N85.8       ED Discharge Orders     None        Discharge Instructions Discussed with and Provided to Patient:     Discharge Instructions      You were evaluated in the Emergency Department and after careful evaluation, we did not find any emergent condition requiring admission or further testing in the hospital.  Your exam/testing today was overall reassuring.  Recommend close follow-up with primary care doctor regarding the confusion this evening.  Can return to the emergency department at any time if this is not improving.  We discussed the uterine mass found on CT, follow-up with your OB/GYN for management.  Please return to the Emergency Department if you experience any worsening of your condition.  Thank you for allowing [place] to  be a part of your care.        Sabas Sous, MD 08/30/22 [phone]

## 2022-08-29 NOTE — ED Notes (Signed)
Lab at bedside  Transport also at bedside to take to [facility]

## 2022-08-29 NOTE — ED Notes (Signed)
Patient transported to CT 

## 2022-08-30 DIAGNOSIS — S0990XA Unspecified injury of head, initial encounter: Secondary | ICD-10-CM | POA: Diagnosis not present

## 2022-08-30 DIAGNOSIS — Z90710 Acquired absence of both cervix and uterus: Secondary | ICD-10-CM | POA: Diagnosis not present

## 2022-08-30 DIAGNOSIS — M47816 Spondylosis without myelopathy or radiculopathy, lumbar region: Secondary | ICD-10-CM | POA: Diagnosis not present

## 2022-08-30 DIAGNOSIS — S3991XA Unspecified injury of abdomen, initial encounter: Secondary | ICD-10-CM | POA: Diagnosis not present

## 2022-08-30 DIAGNOSIS — S199XXA Unspecified injury of neck, initial encounter: Secondary | ICD-10-CM | POA: Diagnosis not present

## 2022-08-30 DIAGNOSIS — R9089 Other abnormal findings on diagnostic imaging of central nervous system: Secondary | ICD-10-CM | POA: Diagnosis not present

## 2022-08-30 DIAGNOSIS — M5126 Other intervertebral disc displacement, lumbar region: Secondary | ICD-10-CM | POA: Diagnosis not present

## 2022-08-30 DIAGNOSIS — N858 Other specified noninflammatory disorders of uterus: Secondary | ICD-10-CM | POA: Diagnosis not present

## 2022-08-30 NOTE — Discharge Instructions (Signed)
You were evaluated in the Emergency Department and after careful evaluation, we did not find any emergent condition requiring admission or further testing in the hospital.  Your exam/testing today was overall reassuring.  Recommend close follow-up with primary care doctor regarding the confusion this evening.  Can return to the emergency department at any time if this is not improving.  We discussed the uterine mass found on CT, follow-up with your OB/GYN for management.  Please return to the Emergency Department if you experience any worsening of your condition.  Thank you for allowing [place] to be a part of your care.

## 2022-08-30 NOTE — ED Notes (Signed)
I&O cath performed by this RN and EDT per EDP request.  Urine sample sent to lab.

## 2022-08-31 LAB — URINE CULTURE: Culture: NO GROWTH

## 2022-09-01 ENCOUNTER — Other Ambulatory Visit: Payer: Self-pay | Admitting: Obstetrics & Gynecology

## 2022-09-01 DIAGNOSIS — R1900 Intra-abdominal and pelvic swelling, mass and lump, unspecified site: Secondary | ICD-10-CM

## 2022-09-01 NOTE — Progress Notes (Signed)
Order for pelvic US 

## 2022-09-04 ENCOUNTER — Telehealth: Payer: Self-pay

## 2022-09-04 ENCOUNTER — Ambulatory Visit: Payer: Medicare HMO | Admitting: Obstetrics & Gynecology

## 2022-09-04 NOTE — Telephone Encounter (Signed)
Transition Care Management Unsuccessful Follow-up Telephone Call  Date of discharge and from where:  08/30/2022 [facility]  Attempts:  1st Attempt  Reason for unsuccessful TCM follow-up call:  Left voice message  Jaline Pincock [facility]  [facility] Population Health Community Resource Care Guide   [email or web address]  ?? [phone]   Website: [email or web address]  [email or web address]

## 2022-09-05 ENCOUNTER — Telehealth: Payer: Self-pay

## 2022-09-05 NOTE — Telephone Encounter (Signed)
Transition Care Management Unsuccessful Follow-up Telephone Call  Date of discharge and from where:  08/30/2022 [facility]  Attempts:  2nd Attempt  Reason for unsuccessful TCM follow-up call:  No answer/busy  Nazifa Trinka [facility]  [facility] Population Health Community Resource Care Guide   [email or web address]  ?? [phone]   Website: [email or web address]  [email or web address]

## 2022-09-09 ENCOUNTER — Other Ambulatory Visit: Payer: Self-pay | Admitting: Obstetrics & Gynecology

## 2022-09-09 ENCOUNTER — Ambulatory Visit (HOSPITAL_COMMUNITY)
Admission: RE | Admit: 2022-09-09 | Discharge: 2022-09-09 | Disposition: A | Discharge status: Home / Self Care | Payer: Medicare HMO | Source: Ambulatory Visit | Attending: Obstetrics & Gynecology | Admitting: Obstetrics & Gynecology

## 2022-09-09 DIAGNOSIS — R1900 Intra-abdominal and pelvic swelling, mass and lump, unspecified site: Secondary | ICD-10-CM

## 2022-09-11 ENCOUNTER — Encounter: Payer: Self-pay | Admitting: Obstetrics & Gynecology

## 2022-09-11 ENCOUNTER — Ambulatory Visit: Payer: Medicare HMO | Admitting: Obstetrics & Gynecology

## 2022-09-11 VITALS — BP 177/90 | HR 86 | Ht 65.0 in | Wt 108.0 lb

## 2022-09-11 DIAGNOSIS — N838 Other noninflammatory disorders of ovary, fallopian tube and broad ligament: Secondary | ICD-10-CM

## 2022-09-11 NOTE — Progress Notes (Signed)
Follow up appointment for results: Ovarian mass diagnosed in ED visit   Chief Complaint  Patient presents with   Follow-up    Discuss pelvic US results    Blood pressure (!) 177/90, pulse 86, height 5\' 5"  (1.651 m), weight 108 lb (49 kg). CLINICAL DATA:  Abdominal trauma, blunt   EXAM: CT ABDOMEN AND PELVIS WITHOUT CONTRAST   TECHNIQUE: Multidetector CT imaging of the abdomen and pelvis was performed following the standard protocol without IV contrast.   RADIATION DOSE REDUCTION: This exam was performed according to the departmental dose-optimization program which includes automated exposure control, adjustment of the mA and/or kV according to patient size and/or use of iterative reconstruction technique.   COMPARISON:  Ultrasound pelvis 10/07/2004   FINDINGS: Lower chest: Bilateral lower lobe subsegmental atelectasis. Reticulations and possible mild bronchiectasis. No acute abnormality. At least 3 vessel coronary calcification.   Hepatobiliary:   Not enlarged. Couple nonspecific calcifications within the liver. Otherwise no focal lesion with limited evaluation on this noncontrast study. Calcified gallstone within the gallbladder lumen. No gallbladder wall thickening or pericholecystic fluid.   No biliary ductal dilatation.   Pancreas: Normal pancreatic contour. No main pancreatic duct dilatation.   Spleen: Not enlarged. No focal lesion.   Adrenals/Urinary Tract:   No nodularity bilaterally.   No hydroureteronephrosis. No nephroureterolithiasis. No contour deforming renal mass.   The urinary bladder is unremarkable.   Stomach/Bowel: No small or large bowel wall thickening or dilatation. Diffuse colonic diverticulosis. The appendix is not definitely identified with no inflammatory changes in the right lower quadrant to suggest acute appendicitis.   Vasculature/Lymphatic:   Severe atherosclerotic plaque. No abdominal aorta or iliac aneurysm.   No  abdominal, pelvic, inguinal lymphadenopathy.   Reproductive: Status post hysterectomy given previous ultrasound pelvis 10/07/2004. Solid 9 x 6.6 cm mass superior to the urinary bladder dome in the expected region of the uterus. Pessary device in appropriate position.   Other: No simple free fluid ascites. No pneumoperitoneum. No mesenteric hematoma identified. No organized fluid collection.   Musculoskeletal:   No significant soft tissue hematoma.   No acute pelvic fracture. Old healed left pelvic fracture. Left femoral screw fixation of the femoral neck. Degenerative changes of the bilateral hips. Old healed sacral insufficiency fracture. Please see separately dictated CT lumbar spine 08/30/2022.   Ports and Devices: None.   IMPRESSION: 1. No acute intra-abdominal, intrapelvic traumatic injury with limited evaluation on this noncontrast study. 2. No acute fracture or traumatic malalignment of the  lumbar spine.   Other imaging findings of potential clinical significance:   1. Solid 9 x 6.6 cm mass superior to the urinary bladder dome in the expected region of the uterus. Status post hysterectomy given previous ultrasound pelvis 10/07/2004. Finding concerning for malignancy. Comparison with prior cross-sectional imaging would be of value. Recommend pelvic ultrasound for further evaluation. 2.  Aortic Atherosclerosis (ICD10-I70.0). 3. Cholelithiasis with no findings of cholecystitis. 4. Colonic diverticulosis with no acute diverticulitis.     Electronically Signed   By: Tish Frederickson M.D.   On: 08/30/2022 00:13   CLINICAL DATA:  Remote hysterectomy.  Pelvic mass.   EXAM: TRANSABDOMINAL ULTRASOUND OF PELVIS   TECHNIQUE: Transabdominal ultrasound examination of the pelvis was performed including evaluation of the uterus, ovaries, adnexal regions, and pelvic cul-de-sac.   COMPARISON:  CT 08/29/2022, sonogram 10/07/2004   FINDINGS: Uterus   Absent    Endometrium   Not applicable   Right ovary   Not visualized   Left ovary  Not visualized   Other findings: There is minimal simple appearing free fluid within the pelvis. The pelvic mass noted on prior CT examination of 08/29/2022 corresponds to a complex debris-filled cystic lesion within the pelvis. Apparent internal vascularity is likely artifactual and related to swirling debris but not well assessed on this examination. This cystic lesion measures 7.0 x 5.0 x 8.8 cm. This appears new from prior examination of 10/07/2004.   IMPRESSION: 1. Complex debris-filled cystic lesion within the pelvis measuring up to 8.8 cm, new from prior examination of 10/07/2004. This may represent a complex cystic lesion such as a chronic hematoma or abscess or a cystic ovarian mass. Further evaluation with contrast-enhanced MRI examination is recommended for further characterization. 2. Minimal simple appearing free fluid within the pelvis.     Electronically Signed   By: Helyn Numbers M.D.   On: 09/12/2022 22:40  [facility] ordered this encounter: No orders of the defined types were placed in this encounter.   Orders for this encounter: Orders Placed This Encounter  Procedures   Ovarian Malignancy Risk-ROMA    Impression + Management Plan   ICD-10-CM   1. Ovarian mass, right  N83.8 Ovarian Malignancy Risk-ROMA      Follow Up: No follow-ups on file.     All questions were answered.  Past Medical History:  Diagnosis Date   Anxiety    but doesn't require meds   Arthritis    Chronic back pain    HNP-takes Tramadol and Pain Pill(at night)   History of blood transfusion    no abnormal reaction noted   History of migraine    last one about 27yrs ago   History of shingles    Hyperlipidemia    takes Simvastatin daily   Hypertension    takes Enalapril and Chlorthalidone daily   Hypothyroidism    takes Levothyroxine daily   Joint pain    Nocturia     Past Surgical  History:  Procedure Laterality Date   ABDOMINAL HYSTERECTOMY     BACK SURGERY     bilateral cataract surgery     COLONOSCOPY     HIP PINNING,CANNULATED Left 04/28/2021   Procedure: CANNULATED HIP PINNING;  Surgeon: Oliver Barre, MD;  Location: [facility];  Service: Orthopedics;  Laterality: Left;   LUMBAR LAMINECTOMY  02/20/2012   LUMBAR LAMINECTOMY  02/20/2012   Procedure: MICRODISCECTOMY LUMBAR LAMINECTOMY;  Surgeon: Eldred Manges, MD;  Location: [facility];  Service: Orthopedics;  Laterality: N/A;  Right L4 hemilaminectomy, microdiscectomy, removal of free fragment    OB History     Gravida  6   Para  5   Term  5   Preterm      AB  1   Living         SAB  1   IAB      Ectopic      Multiple      Live Births              No Known Allergies  Social History   Socioeconomic History   Marital status: Widowed    Spouse name: Not on file   Number of children: Not on file   Years of education: Not on file   Highest education level: Not on file  Occupational History   Not on file  Tobacco Use   Smoking status: Never   Smokeless tobacco: Never  Vaping Use   Vaping status: Never Used  Substance and Sexual Activity  Alcohol use: No   Drug use: No   Sexual activity: Not Currently    Birth control/protection: Surgical    Comment: hyst  Other Topics Concern   Not on file  Social History Narrative   Not on file   Social Determinants of Health   Financial Resource Strain: Not on file  Food Insecurity: Not on file  Transportation Needs: Not on file  Physical Activity: Not on file  Stress: Not on file  Social Connections: Not on file    Family History  Problem Relation Age of Onset   Hypertension Other    Liver cancer Daughter    Heart disease Daughter    Other Son        drowning   COPD Son    Breast cancer Neg Hx

## 2022-09-13 LAB — OVARIAN MALIGNANCY RISK-ROMA
Cancer Antigen (CA) 125: 31.2 U/mL (ref 0.0–38.1)
HE4: 215 pmol/L — ABNORMAL HIGH (ref 0.0–96.9)
Postmenopausal ROMA: 5.03 — ABNORMAL HIGH
Premenopausal ROMA: 7.31 — ABNORMAL HIGH

## 2022-09-13 LAB — PREMENOPAUSAL INTERP: HIGH

## 2022-09-13 LAB — POSTMENOPAUSAL INTERP: HIGH

## 2022-09-18 ENCOUNTER — Telehealth: Payer: Self-pay

## 2022-09-18 NOTE — Telephone Encounter (Signed)
Patients granddaughter called and stated that she is wanting to know the results of her grandmother's blood work.  The patient does have a DPR on file to speak with her granddaughter.

## 2022-09-19 ENCOUNTER — Telehealth: Payer: Self-pay

## 2022-09-19 NOTE — Telephone Encounter (Signed)
Spoke with granddaughter. Informed Dr Despina Hidden was out of the office until Monday and I would get with him to call regarding test result. Granddaughter verbalized understanding.

## 2022-09-19 NOTE — Telephone Encounter (Signed)
Patient's granddaughter is wanting someone to call her about her grandmothers test results.  Granddaughter is listed on her DPR.

## 2022-09-19 NOTE — Telephone Encounter (Signed)
Spoke with the patient regarding the referral to GYN oncology. Patient scheduled as new patient with Dr Pricilla Holm on 10/09/2022. Patient given an arrival time of 10:45am.  Explained to the patient the the doctor will perform a pelvic exam at this visit. Patient given the policy that only one visitor allowed and that visitor must be over 16 yrs are allowed in the Cancer Center. Patient given the address/phone number for the clinic and that the center offers free valet service. Patient aware that masks are option.

## 2022-09-22 ENCOUNTER — Encounter: Payer: Self-pay | Admitting: Obstetrics & Gynecology

## 2022-10-07 ENCOUNTER — Encounter (HOSPITAL_COMMUNITY): Payer: Self-pay | Admitting: *Deleted

## 2022-10-07 ENCOUNTER — Emergency Department (HOSPITAL_COMMUNITY): Payer: Medicare HMO

## 2022-10-07 ENCOUNTER — Other Ambulatory Visit: Payer: Self-pay

## 2022-10-07 ENCOUNTER — Observation Stay (HOSPITAL_COMMUNITY): Payer: Medicare HMO

## 2022-10-07 ENCOUNTER — Inpatient Hospital Stay (HOSPITAL_COMMUNITY)
Admission: EM | Admit: 2022-10-07 | Discharge: 2022-10-09 | DRG: 071 | Disposition: A | Discharge status: Hospice | Payer: Medicare HMO | Attending: Family Medicine | Admitting: Family Medicine

## 2022-10-07 DIAGNOSIS — E785 Hyperlipidemia, unspecified: Secondary | ICD-10-CM | POA: Diagnosis present

## 2022-10-07 DIAGNOSIS — R0902 Hypoxemia: Secondary | ICD-10-CM | POA: Diagnosis not present

## 2022-10-07 DIAGNOSIS — F03918 Unspecified dementia, unspecified severity, with other behavioral disturbance: Secondary | ICD-10-CM | POA: Insufficient documentation

## 2022-10-07 DIAGNOSIS — Z8249 Family history of ischemic heart disease and other diseases of the circulatory system: Secondary | ICD-10-CM | POA: Diagnosis not present

## 2022-10-07 DIAGNOSIS — Z79899 Other long term (current) drug therapy: Secondary | ICD-10-CM

## 2022-10-07 DIAGNOSIS — F05 Delirium due to known physiological condition: Secondary | ICD-10-CM | POA: Diagnosis not present

## 2022-10-07 DIAGNOSIS — R000 Tachycardia, unspecified: Secondary | ICD-10-CM | POA: Diagnosis not present

## 2022-10-07 DIAGNOSIS — Z515 Encounter for palliative care: Secondary | ICD-10-CM | POA: Diagnosis not present

## 2022-10-07 DIAGNOSIS — Z79890 Hormone replacement therapy (postmenopausal): Secondary | ICD-10-CM

## 2022-10-07 DIAGNOSIS — E039 Hypothyroidism, unspecified: Secondary | ICD-10-CM | POA: Diagnosis present

## 2022-10-07 DIAGNOSIS — Z90710 Acquired absence of both cervix and uterus: Secondary | ICD-10-CM | POA: Diagnosis not present

## 2022-10-07 DIAGNOSIS — M8588 Other specified disorders of bone density and structure, other site: Secondary | ICD-10-CM | POA: Diagnosis not present

## 2022-10-07 DIAGNOSIS — Z8619 Personal history of other infectious and parasitic diseases: Secondary | ICD-10-CM

## 2022-10-07 DIAGNOSIS — R9082 White matter disease, unspecified: Secondary | ICD-10-CM | POA: Diagnosis not present

## 2022-10-07 DIAGNOSIS — E876 Hypokalemia: Secondary | ICD-10-CM | POA: Diagnosis present

## 2022-10-07 DIAGNOSIS — I6523 Occlusion and stenosis of bilateral carotid arteries: Secondary | ICD-10-CM | POA: Diagnosis not present

## 2022-10-07 DIAGNOSIS — Z7982 Long term (current) use of aspirin: Secondary | ICD-10-CM

## 2022-10-07 DIAGNOSIS — R4182 Altered mental status, unspecified: Secondary | ICD-10-CM | POA: Diagnosis not present

## 2022-10-07 DIAGNOSIS — Z7189 Other specified counseling: Secondary | ICD-10-CM | POA: Diagnosis not present

## 2022-10-07 DIAGNOSIS — F03B18 Unspecified dementia, moderate, with other behavioral disturbance: Secondary | ICD-10-CM | POA: Diagnosis not present

## 2022-10-07 DIAGNOSIS — S72001A Fracture of unspecified part of neck of right femur, initial encounter for closed fracture: Secondary | ICD-10-CM | POA: Diagnosis not present

## 2022-10-07 DIAGNOSIS — S199XXA Unspecified injury of neck, initial encounter: Secondary | ICD-10-CM | POA: Diagnosis not present

## 2022-10-07 DIAGNOSIS — S0990XA Unspecified injury of head, initial encounter: Secondary | ICD-10-CM | POA: Diagnosis not present

## 2022-10-07 DIAGNOSIS — E860 Dehydration: Secondary | ICD-10-CM | POA: Diagnosis present

## 2022-10-07 DIAGNOSIS — W19XXXA Unspecified fall, initial encounter: Secondary | ICD-10-CM | POA: Diagnosis not present

## 2022-10-07 DIAGNOSIS — G9341 Metabolic encephalopathy: Secondary | ICD-10-CM | POA: Diagnosis not present

## 2022-10-07 DIAGNOSIS — M1611 Unilateral primary osteoarthritis, right hip: Secondary | ICD-10-CM | POA: Diagnosis not present

## 2022-10-07 DIAGNOSIS — R1900 Intra-abdominal and pelvic swelling, mass and lump, unspecified site: Secondary | ICD-10-CM | POA: Diagnosis not present

## 2022-10-07 DIAGNOSIS — S32511A Fracture of superior rim of right pubis, initial encounter for closed fracture: Secondary | ICD-10-CM | POA: Diagnosis not present

## 2022-10-07 DIAGNOSIS — Z1152 Encounter for screening for COVID-19: Secondary | ICD-10-CM

## 2022-10-07 DIAGNOSIS — C569 Malignant neoplasm of unspecified ovary: Secondary | ICD-10-CM | POA: Diagnosis present

## 2022-10-07 DIAGNOSIS — C679 Malignant neoplasm of bladder, unspecified: Secondary | ICD-10-CM | POA: Diagnosis not present

## 2022-10-07 DIAGNOSIS — I10 Essential (primary) hypertension: Secondary | ICD-10-CM | POA: Diagnosis not present

## 2022-10-07 DIAGNOSIS — Z825 Family history of asthma and other chronic lower respiratory diseases: Secondary | ICD-10-CM

## 2022-10-07 DIAGNOSIS — R404 Transient alteration of awareness: Secondary | ICD-10-CM | POA: Diagnosis not present

## 2022-10-07 DIAGNOSIS — D72829 Elevated white blood cell count, unspecified: Secondary | ICD-10-CM | POA: Diagnosis not present

## 2022-10-07 DIAGNOSIS — Z800 Family history of malignant neoplasm of digestive organs: Secondary | ICD-10-CM

## 2022-10-07 DIAGNOSIS — Z66 Do not resuscitate: Secondary | ICD-10-CM | POA: Diagnosis not present

## 2022-10-07 LAB — CBC WITH DIFFERENTIAL/PLATELET
Abs Immature Granulocytes: 0.06 10*3/uL (ref 0.00–0.07)
Basophils Absolute: 0.1 10*3/uL (ref 0.0–0.1)
Basophils Relative: 0 %
Eosinophils Absolute: 0 10*3/uL (ref 0.0–0.5)
Eosinophils Relative: 0 %
HCT: 45 % (ref 36.0–46.0)
Hemoglobin: 15 g/dL (ref 12.0–15.0)
Immature Granulocytes: 0 %
Lymphocytes Relative: 6 %
Lymphs Abs: 1 10*3/uL (ref 0.7–4.0)
MCH: 30 pg (ref 26.0–34.0)
MCHC: 33.3 g/dL (ref 30.0–36.0)
MCV: 90 fL (ref 80.0–100.0)
Monocytes Absolute: 0.8 10*3/uL (ref 0.1–1.0)
Monocytes Relative: 5 %
Neutro Abs: 13.7 10*3/uL — ABNORMAL HIGH (ref 1.7–7.7)
Neutrophils Relative %: 89 %
Platelets: 278 10*3/uL (ref 150–400)
RBC: 5 MIL/uL (ref 3.87–5.11)
RDW: 13.2 % (ref 11.5–15.5)
WBC: 15.6 10*3/uL — ABNORMAL HIGH (ref 4.0–10.5)
nRBC: 0 % (ref 0.0–0.2)

## 2022-10-07 LAB — COMPREHENSIVE METABOLIC PANEL
ALT: 21 U/L (ref 0–44)
AST: 21 U/L (ref 15–41)
Albumin: 4.2 g/dL (ref 3.5–5.0)
Alkaline Phosphatase: 90 U/L (ref 38–126)
Anion gap: 13 (ref 5–15)
BUN: 21 mg/dL (ref 8–23)
CO2: 24 mmol/L (ref 22–32)
Calcium: 9.5 mg/dL (ref 8.9–10.3)
Chloride: 95 mmol/L — ABNORMAL LOW (ref 98–111)
Creatinine, Ser: 0.9 mg/dL (ref 0.44–1.00)
GFR, Estimated: >60 mL/min (ref >60)
Glucose, Bld: 103 mg/dL — ABNORMAL HIGH (ref 70–99)
Potassium: 3.7 mmol/L (ref 3.5–5.1)
Sodium: 132 mmol/L — ABNORMAL LOW (ref 135–145)
Total Bilirubin: 1.2 mg/dL (ref 0.3–1.2)
Total Protein: 7.6 g/dL (ref 6.5–8.1)

## 2022-10-07 LAB — SARS CORONAVIRUS 2 BY RT PCR: SARS Coronavirus 2 by RT PCR: NEGATIVE

## 2022-10-07 LAB — URINALYSIS, W/ REFLEX TO CULTURE (INFECTION SUSPECTED)
Bacteria, UA: NONE SEEN
Bilirubin Urine: NEGATIVE
Glucose, UA: NEGATIVE mg/dL
Hgb urine dipstick: NEGATIVE
Ketones, ur: 5 mg/dL — AB
Leukocytes,Ua: NEGATIVE
Nitrite: NEGATIVE
Protein, ur: 30 mg/dL — AB
Specific Gravity, Urine: 1.012 (ref 1.005–1.030)
pH: 7 (ref 5.0–8.0)

## 2022-10-07 LAB — TSH: TSH: 1.269 u[IU]/mL (ref 0.350–4.500)

## 2022-10-07 MED ORDER — HALOPERIDOL LACTATE 5 MG/ML IJ SOLN
2.0000 mg | Freq: Once | INTRAMUSCULAR | Status: DC
  Filled 2022-10-07: qty 1

## 2022-10-07 MED ORDER — HYDRALAZINE HCL 20 MG/ML IJ SOLN
5.0000 mg | INTRAMUSCULAR | Status: DC | PRN
  Administered 2022-10-07: 5 mg via INTRAVENOUS
  Filled 2022-10-07: qty 1

## 2022-10-07 MED ORDER — LACTATED RINGERS IV BOLUS
500.0000 mL | Freq: Once | INTRAVENOUS | Status: AC
  Administered 2022-10-07: 500 mL via INTRAVENOUS

## 2022-10-07 MED ORDER — LEVOTHYROXINE SODIUM 50 MCG PO TABS
50.0000 ug | ORAL_TABLET | Freq: Every day | ORAL | Status: DC
  Administered 2022-10-09: 50 ug via ORAL
  Filled 2022-10-07: qty 1

## 2022-10-07 MED ORDER — LORAZEPAM 2 MG/ML IJ SOLN
1.0000 mg | Freq: Once | INTRAMUSCULAR | Status: AC
  Administered 2022-10-07: 1 mg via INTRAVENOUS
  Filled 2022-10-07: qty 1

## 2022-10-07 MED ORDER — LORAZEPAM 2 MG/ML IJ SOLN
1.0000 mg | INTRAMUSCULAR | Status: DC | PRN
  Filled 2022-10-07: qty 1

## 2022-10-07 MED ORDER — ENOXAPARIN SODIUM 40 MG/0.4ML IJ SOSY
40.0000 mg | PREFILLED_SYRINGE | INTRAMUSCULAR | Status: DC
  Administered 2022-10-07: 40 mg via SUBCUTANEOUS
  Filled 2022-10-07: qty 0.4

## 2022-10-07 MED ORDER — ONDANSETRON HCL 4 MG PO TABS: 4.0000 mg | ORAL_TABLET | Freq: Four times a day (QID) | ORAL | Status: DC | PRN

## 2022-10-07 MED ORDER — HALOPERIDOL LACTATE 5 MG/ML IJ SOLN
2.0000 mg | Freq: Once | INTRAMUSCULAR | Status: AC
  Administered 2022-10-07: 2 mg via INTRAVENOUS
  Filled 2022-10-07: qty 1

## 2022-10-07 MED ORDER — ONDANSETRON HCL 4 MG/2ML IJ SOLN: 4.0000 mg | Freq: Four times a day (QID) | INTRAMUSCULAR | Status: DC | PRN

## 2022-10-07 MED ORDER — HALOPERIDOL LACTATE 5 MG/ML IJ SOLN
2.0000 mg | Freq: Once | INTRAMUSCULAR | Status: AC
  Administered 2022-10-07: 2 mg via INTRAVENOUS

## 2022-10-07 MED ORDER — LACTATED RINGERS IV SOLN: INTRAVENOUS | Status: DC

## 2022-10-07 NOTE — ED Notes (Addendum)
Pt continues to attempt to get out of bed and redirection not helpful. Lights dimmed, room quiet and pt still attempting. Seizure pads applied for safety.

## 2022-10-07 NOTE — ED Notes (Signed)
EDP aware of high bp.

## 2022-10-07 NOTE — ED Notes (Signed)
Pt agitated and trying to get out of bed. Pt pulling at IV and leads. MD informed.

## 2022-10-07 NOTE — ED Triage Notes (Signed)
Pt BIB [facility] from home for c/o AMS; pt has dementia but family states pt is more alert than she is today  Pt fell x 3 days ago but has no deformities

## 2022-10-07 NOTE — H&P (Signed)
History and Physical    Patient: Valerie Davenport:[id number] DOB: 1932/01/27 DOA: 10/07/2022 DOS: the patient was seen and examined on 10/07/2022 PCP: Lavada Mesi, MD  Patient coming from: Home  Chief Complaint:  Chief Complaint  Patient presents with   Fall   HPI: Valerie Davenport is a 89 y.o. female with medical history significant of dementia, hypertension, pelvic mass.  History is primarily from EMS and son.  The patient has had diminished appetite and has not been eating a lot over the past couple of days.  She appeared reasonably well at 7 AM but progressively became more confused.  She became very agitated needing a couple of different medications in order to keep her calm.  While here, she began to scream and was inconsolable.  There did not appear to be any source of her agitation, and needed Haldol and Ativan in order to calm her.  She is currently fairly sedated and unable to provide history.  Her son states that she has otherwise been well.  No fevers, chills.  She does have dementia and has been declining over the past several weeks.  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  Review of Systems: As mentioned in the history of present illness. All other systems reviewed and are negative. Past Medical History:  Diagnosis Date   Anxiety    but doesn't require meds   Arthritis    Chronic back pain    HNP-takes Tramadol and Pain Pill(at night)   History of blood transfusion    no abnormal reaction noted   History of migraine    last one about 24yrs ago   History of shingles    Hyperlipidemia    takes Simvastatin daily   Hypertension    takes Enalapril and Chlorthalidone daily   Hypothyroidism    takes Levothyroxine daily   Joint pain    Nocturia    Past Surgical History:  Procedure Laterality Date   ABDOMINAL HYSTERECTOMY     BACK SURGERY     bilateral cataract surgery     COLONOSCOPY     HIP  PINNING,CANNULATED Left 04/28/2021   Procedure: CANNULATED HIP PINNING;  Surgeon: Oliver Barre, MD;  Location: [facility];  Service: Orthopedics;  Laterality: Left;   LUMBAR LAMINECTOMY  02/20/2012   LUMBAR LAMINECTOMY  02/20/2012   Procedure: MICRODISCECTOMY LUMBAR LAMINECTOMY;  Surgeon: Eldred Manges, MD;  Location: [facility];  Service: Orthopedics;  Laterality: N/A;  Right L4 hemilaminectomy, microdiscectomy, removal of free fragment   Social History:  reports that she has never smoked. She has never used smokeless tobacco. She reports that she does not drink alcohol and does not use drugs.  No Known Allergies  Family History  Problem Relation Age of Onset   Hypertension Other    Liver cancer Daughter    Heart disease Daughter    Other Son        drowning   COPD Son    Breast cancer Neg Hx     Prior to Admission medications   Medication Sig Start Date End Date Taking? Authorizing Provider  acetaminophen (TYLENOL) 325 MG tablet Take 2 tablets (650 mg total) by mouth in the morning, at noon, and at bedtime. 04/29/21  Yes Johnson, Clanford L, MD  aspirin EC 81 MG tablet Swallow whole. Take 1 po BID x 6 weeks, then resume 1 po daily 04/29/21  Yes Johnson, Clanford L,  MD  Cyanocobalamin (VITAMIN B-12 PO) Take by mouth.   Yes [provider]  levothyroxine (SYNTHROID) 50 MCG tablet TAKE 1 TABLET (50 MCG TOTAL) BY MOUTH DAILY BEFORE BREAKFAST. 05/16/20  Yes Hilts, Michael, MD  Vitamin D-Vitamin K (K2-D3 MAX PO) Take 1 tablet by mouth daily. 90 mcg 125 mcg, 5000 units   Yes [provider]  traZODone (DESYREL) 50 MG tablet Take 50 mg by mouth at bedtime. Patient not taking: Reported on 10/07/2022 10/07/22   [provider]  amLODipine (NORVASC) 5 MG tablet Take 1 tablet (5 mg total) by mouth daily. Patient not taking: Reported on 11/10/2018 10/26/16 01/27/19  Glynn Octave, MD    Physical Exam: Vitals:   10/07/22 1830 10/07/22 1928 10/07/22 1945 10/07/22 2002  BP: (!)  170/159  (!) 167/97 (!) 177/92  Pulse:  94 96 81  Resp: (!) 23 20 (!) 24 (!) 23  Temp:      TempSrc:      SpO2: 100% 95% 95% 97%   General: Elderly female.  Somnolent.. No acute cardiopulmonary distress.  HEENT: Normocephalic atraumatic.  Right and left ears normal in appearance.  Pupils equal, round, reactive to light. Extraocular muscles are intact. Sclerae anicteric and noninjected.  Moist mucosal membranes. No mucosal lesions.  Neck: Neck supple without lymphadenopathy. No carotid bruits. No masses palpated.  Cardiovascular: Regular rate with normal S1-S2 sounds. No murmurs, rubs, gallops auscultated. No JVD.  Respiratory: Good respiratory effort with no wheezes, rales, rhonchi. Lungs clear to auscultation bilaterally.  No accessory muscle use. Abdomen: Soft, nontender, nondistended. Active bowel sounds. No masses or hepatosplenomegaly  Skin: No rashes, lesions, or ulcerations.  Dry, warm to touch. 2+ dorsalis pedis and radial pulses. Musculoskeletal: No calf or leg pain. All major joints not erythematous nontender.  No upper or lower joint deformation.  Good ROM.  No contractures  Psychiatric: Unable to assess Neurologic: Unable to assess  Data Reviewed: Results for orders placed or performed during the hospital encounter of 10/07/22 (from the past 24 hour(s))  Comprehensive metabolic panel     Status: Abnormal   Collection Time: 10/07/22  4:20 PM  Result Value Ref Range   Sodium 132 (L) 135 - 145 mmol/L   Potassium 3.7 3.5 - 5.1 mmol/L   Chloride 95 (L) 98 - 111 mmol/L   CO2 24 22 - 32 mmol/L   Glucose, Bld 103 (H) 70 - 99 mg/dL   BUN 21 8 - 23 mg/dL   Creatinine, Ser 7.62 0.44 - 1.00 mg/dL   Calcium 9.5 8.9 - 83.1 mg/dL   Total Protein 7.6 6.5 - 8.1 g/dL   Albumin 4.2 3.5 - 5.0 g/dL   AST 21 15 - 41 U/L   ALT 21 0 - 44 U/L   Alkaline Phosphatase 90 38 - 126 U/L   Total Bilirubin 1.2 0.3 - 1.2 mg/dL   GFR, Estimated >51 >76 mL/min   Anion gap 13 5 - 15  CBC with  Differential     Status: Abnormal   Collection Time: 10/07/22  4:20 PM  Result Value Ref Range   WBC 15.6 (H) 4.0 - 10.5 K/uL   RBC 5.00 3.87 - 5.11 MIL/uL   Hemoglobin 15.0 12.0 - 15.0 g/dL   HCT 16.0 73.7 - 10.6 %   MCV 90.0 80.0 - 100.0 fL   MCH 30.0 26.0 - 34.0 pg   MCHC 33.3 30.0 - 36.0 g/dL   RDW 26.9 48.5 - 46.2 %   Platelets 278  150 - 400 K/uL   nRBC 0.0 0.0 - 0.2 %   Neutrophils Relative % 89 %   Neutro Abs 13.7 (H) 1.7 - 7.7 K/uL   Lymphocytes Relative 6 %   Lymphs Abs 1.0 0.7 - 4.0 K/uL   Monocytes Relative 5 %   Monocytes Absolute 0.8 0.1 - 1.0 K/uL   Eosinophils Relative 0 %   Eosinophils Absolute 0.0 0.0 - 0.5 K/uL   Basophils Relative 0 %   Basophils Absolute 0.1 0.0 - 0.1 K/uL   Immature Granulocytes 0 %   Abs Immature Granulocytes 0.06 0.00 - 0.07 K/uL  Urinalysis, w/ Reflex to Culture (Infection Suspected) -Urine, Catheterized     Status: Abnormal   Collection Time: 10/07/22  4:20 PM  Result Value Ref Range   Specimen Source URINE, CATHETERIZED    Color, Urine YELLOW YELLOW   APPearance CLEAR CLEAR   Specific Gravity, Urine 1.012 1.005 - 1.030   pH 7.0 5.0 - 8.0   Glucose, UA NEGATIVE NEGATIVE mg/dL   Hgb urine dipstick NEGATIVE NEGATIVE   Bilirubin Urine NEGATIVE NEGATIVE   Ketones, ur 5 (A) NEGATIVE mg/dL   Protein, ur 30 (A) NEGATIVE mg/dL   Nitrite NEGATIVE NEGATIVE   Leukocytes,Ua NEGATIVE NEGATIVE   RBC / HPF 0-5 0 - 5 RBC/hpf   WBC, UA 0-5 0 - 5 WBC/hpf   Bacteria, UA NONE SEEN NONE SEEN   Squamous Epithelial / HPF 0-5 0 - 5 /HPF   Mucus PRESENT   SARS Coronavirus 2 by RT PCR (hospital order, performed in [facility] hospital lab) *cepheid single result test* Anterior Nasal Swab     Status: None   Collection Time: 10/07/22  4:20 PM   Specimen: Anterior Nasal Swab  Result Value Ref Range   SARS Coronavirus 2 by RT PCR NEGATIVE NEGATIVE    DG Pelvis Portable  Result Date: 10/07/2022 CLINICAL DATA:  Trauma combative altered EXAM: PORTABLE  PELVIS 1-2 VIEWS COMPARISON:  03/28/2022 FINDINGS: Limited by technique and positioning. Screw fixation of the proximal left femur. SI joints are non widened. Pubic symphysis appears intact. Old fracture left inferior pubic ramus. Possible acute right superior pubic ramus fracture. Potential nondisplaced right femoral neck fracture. IMPRESSION: Limited by technique and positioning. Possible acute right superior pubic ramus fracture. Potential nondisplaced right femoral neck fracture. CT recommended for further assessment Electronically Signed   By: Jasmine Pang M.D.   On: 10/07/2022 19:58   CT HEAD WO CONTRAST  Result Date: 10/07/2022 CLINICAL DATA:  Head trauma, moderate-severe; Polytrauma, blunt. Altered mental status. Recent fall. EXAM: CT HEAD WITHOUT CONTRAST CT CERVICAL SPINE WITHOUT CONTRAST TECHNIQUE: Multidetector CT imaging of the head and cervical spine was performed following the standard protocol without intravenous contrast. Multiplanar CT image reconstructions of the cervical spine were also generated. RADIATION DOSE REDUCTION: This exam was performed according to the departmental dose-optimization program which includes automated exposure control, adjustment of the mA and/or kV according to patient size and/or use of iterative reconstruction technique. COMPARISON:  CT head and cervical spine 08/30/2022 FINDINGS: CT HEAD FINDINGS The study is severely motion degraded despite attempts at repeat imaging and utilization of immobilization devices. Brain: No acute large territory infarct, or gross intracranial hemorrhage, midline shift, or sizeable extra-axial fluid collection is identified. The degree of motion severely reduces sensitivity for detection of small hemorrhages or infarcts. Cerebral atrophy is unchanged. Chronic cerebral white matter disease is not evaluated in detail. Vascular: Calcified atherosclerosis at the skull base. Skull: No displaced  skull fracture is identified within  limitations of motion artifact. Sinuses/Orbits: The visualized paranasal sinuses and mastoid air cells are clear. Bilateral cataract extraction. Other: None. CT CERVICAL SPINE FINDINGS The study is moderately motion degraded. Alignment: Chronic reversal of the normal cervical lordosis with chronic grade 1 anterolisthesis of C2 on C3 and trace retrolisthesis of C5 on C6. Skull base and vertebrae: No acute fracture is identified within limitations of motion artifact. Advanced C1-2 arthropathy is similar to the prior study, with prominence ligamentous thickening and soft tissue calcification about the dens and with erosions again seen in the dens. Soft tissues and spinal canal: No evidence of significant prevertebral fluid or swelling or gross spinal canal hematoma within limitations of motion artifact. Disc levels: Similar appearance of moderate multilevel disc degeneration and and asymmetrically advanced multilevel right-sided facet arthrosis. Upper chest: No acute abnormality. Other: Prominent atherosclerotic calcification at the carotid bifurcations. IMPRESSION: 1. Severely motion degraded head CT. No gross acute intracranial abnormality. 2. Moderately motion degraded cervical spine CT. No acute fracture identified. Electronically Signed   By: Sebastian Ache M.D.   On: 10/07/2022 19:55   CT CERVICAL SPINE WO CONTRAST  Result Date: 10/07/2022 CLINICAL DATA:  Head trauma, moderate-severe; Polytrauma, blunt. Altered mental status. Recent fall. EXAM: CT HEAD WITHOUT CONTRAST CT CERVICAL SPINE WITHOUT CONTRAST TECHNIQUE: Multidetector CT imaging of the head and cervical spine was performed following the standard protocol without intravenous contrast. Multiplanar CT image reconstructions of the cervical spine were also generated. RADIATION DOSE REDUCTION: This exam was performed according to the departmental dose-optimization program which includes automated exposure control, adjustment of the mA and/or kV according to  patient size and/or use of iterative reconstruction technique. COMPARISON:  CT head and cervical spine 08/30/2022 FINDINGS: CT HEAD FINDINGS The study is severely motion degraded despite attempts at repeat imaging and utilization of immobilization devices. Brain: No acute large territory infarct, or gross intracranial hemorrhage, midline shift, or sizeable extra-axial fluid collection is identified. The degree of motion severely reduces sensitivity for detection of small hemorrhages or infarcts. Cerebral atrophy is unchanged. Chronic cerebral white matter disease is not evaluated in detail. Vascular: Calcified atherosclerosis at the skull base. Skull: No displaced skull fracture is identified within limitations of motion artifact. Sinuses/Orbits: The visualized paranasal sinuses and mastoid air cells are clear. Bilateral cataract extraction. Other: None. CT CERVICAL SPINE FINDINGS The study is moderately motion degraded. Alignment: Chronic reversal of the normal cervical lordosis with chronic grade 1 anterolisthesis of C2 on C3 and trace retrolisthesis of C5 on C6. Skull base and vertebrae: No acute fracture is identified within limitations of motion artifact. Advanced C1-2 arthropathy is similar to the prior study, with prominence ligamentous thickening and soft tissue calcification about the dens and with erosions again seen in the dens. Soft tissues and spinal canal: No evidence of significant prevertebral fluid or swelling or gross spinal canal hematoma within limitations of motion artifact. Disc levels: Similar appearance of moderate multilevel disc degeneration and and asymmetrically advanced multilevel right-sided facet arthrosis. Upper chest: No acute abnormality. Other: Prominent atherosclerotic calcification at the carotid bifurcations. IMPRESSION: 1. Severely motion degraded head CT. No gross acute intracranial abnormality. 2. Moderately motion degraded cervical spine CT. No acute fracture identified.  Electronically Signed   By: Sebastian Ache M.D.   On: 10/07/2022 19:55   DG Chest Port 1 View  Result Date: 10/07/2022 CLINICAL DATA:  Chest trauma.  Altered mental status and fall. EXAM: PORTABLE CHEST 1 VIEW COMPARISON:  Chest radiograph dated  08/29/2022. FINDINGS: No focal consolidation, pleural effusion, pneumothorax. Minimal left lung base atelectasis/scarring. The cardiac silhouette is within normal limits. Osteopenia with degenerative changes of the spine. No acute osseous pathology. IMPRESSION: No active disease. Electronically Signed   By: Elgie Collard M.D.   On: 10/07/2022 19:53     Assessment and Plan: No notes have been filed under this hospital service. Service: Hospitalist  Principal Problem:   AMS (altered mental status) Active Problems:   Essential hypertension   Hypothyroid   Leukocytosis   Acute metabolic encephalopathy   Dementia with behavioral disturbance (HCC)  Metabolic encephalopathy Uncertain of etiology: This may be worsening of her dementia. EDP discussed patient with neurology who recommended decreasing her blood pressure to approximately 170 likely.  If not improved, will recommended MRI. Will check TSH and vitamin B12 IV hydralazine for blood pressure elevation Ativan for agitation Dementia with behavioral disturbance Leukocytosis This appears chronic and at baseline Pelvic mass Likely ovarian cancer Has follow-up with GYN oncology on Thursday Hypothyroid Continue Synthroid Check TSH    Advance Care Planning:   Code Status: Limited: Do not attempt resuscitation (DNR) -DNR-LIMITED -Do Not Intubate/DNI confirmed by patient's son who is caregiver and power of attorney  Consults: None  Family Communication: Son  Severity of Illness: The appropriate patient status for this patient is OBSERVATION. Observation status is judged to be reasonable and necessary in order to provide the required intensity of service to ensure the patient's safety. The  patient's presenting symptoms, physical exam findings, and initial radiographic and laboratory data in the context of their medical condition is felt to place them at decreased risk for further clinical deterioration. Furthermore, it is anticipated that the patient will be medically stable for discharge from the hospital within 2 midnights of admission.   Author: Levie Heritage, DO 10/07/2022 8:44 PM  For on call review [email or web address].

## 2022-10-07 NOTE — ED Provider Notes (Signed)
[place] EMERGENCY DEPARTMENT AT [facility] Provider Note   CSN: [id number] Arrival date & time: 10/07/22  1435     History  Chief Complaint  Patient presents with   Valerie Davenport is a 89 y.o. female.  HPI 89 year old female with a history of hypertension and progressive dementia presents with altered mental status.  History is primarily from EMS and then shortly thereafter from the son and his wife.  They check on the patient frequently and are able to see what she is doing through a ring camera.  Son gave her her meds this morning but she would not eat.  Seem to be okay at around 7 AM.  Has gotten progressively more confused and now stopped talking ever since then.  At times she seemed to be screaming though at nothing in particular.  She did not seem to have any focal weakness but seem to stop being able to walk.  This is not normal as she normally requires no assistance to walk.  No recent illness such as a fever.  She was in the hospital last month for a fall and her dementia seems to be getting rapidly progressively worse since then. She had a couple falls yesterday but didn't seem to suffer any injuries.  Home Medications Prior to Admission medications   Medication Sig Start Date End Date Taking? Authorizing Provider  acetaminophen (TYLENOL) 325 MG tablet Take 2 tablets (650 mg total) by mouth in the morning, at noon, and at bedtime. 04/29/21  Yes Johnson, Clanford L, MD  aspirin EC 81 MG tablet Swallow whole. Take 1 po BID x 6 weeks, then resume 1 po daily 04/29/21  Yes Johnson, Clanford L, MD  Cyanocobalamin (VITAMIN B-12 PO) Take by mouth.   Yes [provider]  levothyroxine (SYNTHROID) 50 MCG tablet TAKE 1 TABLET (50 MCG TOTAL) BY MOUTH DAILY BEFORE BREAKFAST. 05/16/20  Yes Hilts, Michael, MD  Vitamin D-Vitamin K (K2-D3 MAX PO) Take 1 tablet by mouth daily. 90 mcg 125 mcg, 5000 units   Yes [provider]  traZODone (DESYREL) 50 MG tablet Take  50 mg by mouth at bedtime. Patient not taking: Reported on 10/07/2022 10/07/22   [provider]  amLODipine (NORVASC) 5 MG tablet Take 1 tablet (5 mg total) by mouth daily. Patient not taking: Reported on 11/10/2018 10/26/16 01/27/19  Glynn Octave, MD      Allergies    Patient has no known allergies.    Review of Systems   Review of Systems  Unable to perform ROS: Dementia    Physical Exam Updated Vital Signs BP (!) 170/135   Pulse (!) 125   Temp 99.5 F (37.5 C) (Oral)   Resp 20   Ht 5\' 4"  (1.626 m)   Wt 45.6 kg   SpO2 98%   BMI 17.26 kg/m  Physical Exam Vitals and nursing note reviewed.  Constitutional:      Appearance: She is well-developed. She is not diaphoretic.  HENT:     Head: Normocephalic and atraumatic.  Cardiovascular:     Rate and Rhythm: Normal rate and regular rhythm.     Heart sounds: Normal heart sounds.  Pulmonary:     Effort: Pulmonary effort is normal.     Breath sounds: Normal breath sounds.  Abdominal:     General: There is no distension.     Palpations: Abdomen is soft.     Tenderness: There is no abdominal tenderness.  Musculoskeletal:  Right hip: Normal range of motion.     Left hip: Normal range of motion.  Skin:    General: Skin is warm and dry.  Neurological:     Mental Status: She is alert.     Comments: Awake, seems to mumble when I talk to her and can't understand her. Doesn't really follow commands but moves all 4 extremities equally and has good tone     ED Results / Procedures / Treatments   Labs (all labs ordered are listed, but only abnormal results are displayed) Labs Reviewed  COMPREHENSIVE METABOLIC PANEL - Abnormal; Notable for the following components:      Result Value   Sodium 132 (*)    Chloride 95 (*)    Glucose, Bld 103 (*)    All other components within normal limits  CBC WITH DIFFERENTIAL/PLATELET - Abnormal; Notable for the following components:   WBC 15.6 (*)    Neutro Abs 13.7 (*)    All  other components within normal limits  URINALYSIS, W/ REFLEX TO CULTURE (INFECTION SUSPECTED) - Abnormal; Notable for the following components:   Ketones, ur 5 (*)    Protein, ur 30 (*)    All other components within normal limits  SARS CORONAVIRUS 2 BY RT PCR  TSH  BASIC METABOLIC PANEL  CBC  VITAMIN B12    EKG EKG Interpretation Date/Time:  Tuesday October 07 2022 16:05:21 EDT Ventricular Rate:  102 PR Interval:    QRS Duration:  85 QT Interval:  364 QTC Calculation: 475 R Axis:   75  Text Interpretation: Sinus tachycardia Borderline repolarization abnormality overall similar to Aug 2024 Confirmed by Pricilla Loveless [phone]) on 10/07/2022 4:23:34 PM  Radiology CT Hip Right Wo Contrast  Result Date: 10/07/2022 CLINICAL DATA:  Trauma to the right hip a pain. Concern for fracture. EXAM: CT OF THE RIGHT HIP WITHOUT CONTRAST TECHNIQUE: Multidetector CT imaging of the right hip was performed according to the standard protocol. Multiplanar CT image reconstructions were also generated. RADIATION DOSE REDUCTION: This exam was performed according to the departmental dose-optimization program which includes automated exposure control, adjustment of the mA and/or kV according to patient size and/or use of iterative reconstruction technique. COMPARISON:  Right hip radiograph dated 10/07/2022. FINDINGS: Bones/Joint/Cartilage Subacute appearing fractures of the right superior and inferior pubic rami. No definite acute fracture. Evaluation however is limited due to osteopenia. No dislocation. There is moderate arthritic changes of the right hip. No joint effusion. Ligaments Suboptimally assessed by CT. Muscles and Tendons No acute findings.  No intramuscular fluid collection. Soft tissues No acute findings.  A pessary is partially visualized in the pelvis. IMPRESSION: 1. No acute fracture or dislocation. 2. Subacute appearing fractures of the right superior and inferior pubic rami. 3. Moderate arthritic  changes of the right hip. Electronically Signed   By: Elgie Collard M.D.   On: 10/07/2022 22:09   DG Pelvis Portable  Result Date: 10/07/2022 CLINICAL DATA:  Trauma combative altered EXAM: PORTABLE PELVIS 1-2 VIEWS COMPARISON:  03/28/2022 FINDINGS: Limited by technique and positioning. Screw fixation of the proximal left femur. SI joints are non widened. Pubic symphysis appears intact. Old fracture left inferior pubic ramus. Possible acute right superior pubic ramus fracture. Potential nondisplaced right femoral neck fracture. IMPRESSION: Limited by technique and positioning. Possible acute right superior pubic ramus fracture. Potential nondisplaced right femoral neck fracture. CT recommended for further assessment Electronically Signed   By: Jasmine Pang M.D.   On: 10/07/2022 19:58   CT  HEAD WO CONTRAST  Result Date: 10/07/2022 CLINICAL DATA:  Head trauma, moderate-severe; Polytrauma, blunt. Altered mental status. Recent fall. EXAM: CT HEAD WITHOUT CONTRAST CT CERVICAL SPINE WITHOUT CONTRAST TECHNIQUE: Multidetector CT imaging of the head and cervical spine was performed following the standard protocol without intravenous contrast. Multiplanar CT image reconstructions of the cervical spine were also generated. RADIATION DOSE REDUCTION: This exam was performed according to the departmental dose-optimization program which includes automated exposure control, adjustment of the mA and/or kV according to patient size and/or use of iterative reconstruction technique. COMPARISON:  CT head and cervical spine 08/30/2022 FINDINGS: CT HEAD FINDINGS The study is severely motion degraded despite attempts at repeat imaging and utilization of immobilization devices. Brain: No acute large territory infarct, or gross intracranial hemorrhage, midline shift, or sizeable extra-axial fluid collection is identified. The degree of motion severely reduces sensitivity for detection of small hemorrhages or infarcts. Cerebral  atrophy is unchanged. Chronic cerebral white matter disease is not evaluated in detail. Vascular: Calcified atherosclerosis at the skull base. Skull: No displaced skull fracture is identified within limitations of motion artifact. Sinuses/Orbits: The visualized paranasal sinuses and mastoid air cells are clear. Bilateral cataract extraction. Other: None. CT CERVICAL SPINE FINDINGS The study is moderately motion degraded. Alignment: Chronic reversal of the normal cervical lordosis with chronic grade 1 anterolisthesis of C2 on C3 and trace retrolisthesis of C5 on C6. Skull base and vertebrae: No acute fracture is identified within limitations of motion artifact. Advanced C1-2 arthropathy is similar to the prior study, with prominence ligamentous thickening and soft tissue calcification about the dens and with erosions again seen in the dens. Soft tissues and spinal canal: No evidence of significant prevertebral fluid or swelling or gross spinal canal hematoma within limitations of motion artifact. Disc levels: Similar appearance of moderate multilevel disc degeneration and and asymmetrically advanced multilevel right-sided facet arthrosis. Upper chest: No acute abnormality. Other: Prominent atherosclerotic calcification at the carotid bifurcations. IMPRESSION: 1. Severely motion degraded head CT. No gross acute intracranial abnormality. 2. Moderately motion degraded cervical spine CT. No acute fracture identified. Electronically Signed   By: Sebastian Ache M.D.   On: 10/07/2022 19:55   CT CERVICAL SPINE WO CONTRAST  Result Date: 10/07/2022 CLINICAL DATA:  Head trauma, moderate-severe; Polytrauma, blunt. Altered mental status. Recent fall. EXAM: CT HEAD WITHOUT CONTRAST CT CERVICAL SPINE WITHOUT CONTRAST TECHNIQUE: Multidetector CT imaging of the head and cervical spine was performed following the standard protocol without intravenous contrast. Multiplanar CT image reconstructions of the cervical spine were also  generated. RADIATION DOSE REDUCTION: This exam was performed according to the departmental dose-optimization program which includes automated exposure control, adjustment of the mA and/or kV according to patient size and/or use of iterative reconstruction technique. COMPARISON:  CT head and cervical spine 08/30/2022 FINDINGS: CT HEAD FINDINGS The study is severely motion degraded despite attempts at repeat imaging and utilization of immobilization devices. Brain: No acute large territory infarct, or gross intracranial hemorrhage, midline shift, or sizeable extra-axial fluid collection is identified. The degree of motion severely reduces sensitivity for detection of small hemorrhages or infarcts. Cerebral atrophy is unchanged. Chronic cerebral white matter disease is not evaluated in detail. Vascular: Calcified atherosclerosis at the skull base. Skull: No displaced skull fracture is identified within limitations of motion artifact. Sinuses/Orbits: The visualized paranasal sinuses and mastoid air cells are clear. Bilateral cataract extraction. Other: None. CT CERVICAL SPINE FINDINGS The study is moderately motion degraded. Alignment: Chronic reversal of the normal cervical lordosis with chronic  grade 1 anterolisthesis of C2 on C3 and trace retrolisthesis of C5 on C6. Skull base and vertebrae: No acute fracture is identified within limitations of motion artifact. Advanced C1-2 arthropathy is similar to the prior study, with prominence ligamentous thickening and soft tissue calcification about the dens and with erosions again seen in the dens. Soft tissues and spinal canal: No evidence of significant prevertebral fluid or swelling or gross spinal canal hematoma within limitations of motion artifact. Disc levels: Similar appearance of moderate multilevel disc degeneration and and asymmetrically advanced multilevel right-sided facet arthrosis. Upper chest: No acute abnormality. Other: Prominent atherosclerotic  calcification at the carotid bifurcations. IMPRESSION: 1. Severely motion degraded head CT. No gross acute intracranial abnormality. 2. Moderately motion degraded cervical spine CT. No acute fracture identified. Electronically Signed   By: Sebastian Ache M.D.   On: 10/07/2022 19:55   DG Chest Port 1 View  Result Date: 10/07/2022 CLINICAL DATA:  Chest trauma.  Altered mental status and fall. EXAM: PORTABLE CHEST 1 VIEW COMPARISON:  Chest radiograph dated 08/29/2022. FINDINGS: No focal consolidation, pleural effusion, pneumothorax. Minimal left lung base atelectasis/scarring. The cardiac silhouette is within normal limits. Osteopenia with degenerative changes of the spine. No acute osseous pathology. IMPRESSION: No active disease. Electronically Signed   By: Elgie Collard M.D.   On: 10/07/2022 19:53    Procedures .Critical Care  Performed by: Pricilla Loveless, MD Authorized by: Pricilla Loveless, MD   Critical care provider statement:    Critical care time (minutes):  30   Critical care time was exclusive of:  Separately billable procedures and treating other patients   Critical care was necessary to treat or prevent imminent or life-threatening deterioration of the following conditions:  CNS failure or compromise   Critical care was time spent personally by me on the following activities:  Development of treatment plan with patient or surrogate, discussions with consultants, evaluation of patient's response to treatment, examination of patient, ordering and review of laboratory studies, ordering and review of radiographic studies, ordering and performing treatments and interventions, pulse oximetry, re-evaluation of patient's condition and review of old charts     Medications Ordered in ED Medications  hydrALAZINE (APRESOLINE) injection 5 mg (5 mg Intravenous Given 10/07/22 2323)  LORazepam (ATIVAN) injection 1 mg (has no administration in time range)  levothyroxine (SYNTHROID) tablet 50 mcg (has  no administration in time range)  enoxaparin (LOVENOX) injection 40 mg (40 mg Subcutaneous Given 10/07/22 2249)  lactated ringers infusion ( Intravenous New Bag/Given 10/07/22 2250)  ondansetron (ZOFRAN) tablet 4 mg (has no administration in time range)    Or  ondansetron (ZOFRAN) injection 4 mg (has no administration in time range)  lactated ringers bolus 500 mL (0 mLs Intravenous Stopped 10/07/22 1831)  haloperidol lactate (HALDOL) injection 2 mg (2 mg Intravenous Given 10/07/22 1728)  haloperidol lactate (HALDOL) injection 2 mg (2 mg Intravenous Given 10/07/22 1841)  lactated ringers bolus 500 mL (0 mLs Intravenous Stopped 10/07/22 1918)  LORazepam (ATIVAN) injection 1 mg (1 mg Intravenous Given 10/07/22 1920)    ED Course/ Medical Decision Making/ A&P                                 Medical Decision Making Amount and/or Complexity of Data Reviewed Labs: ordered.    Details: Leukocytosis but seems to be stable from baseline.  No UTI. Radiology: ordered and independent interpretation performed.    Details: No head  bleed ECG/medicine tests: ordered and independent interpretation performed.    Details: Irregular rhythm.  Risk Prescription drug management. Decision regarding hospitalization.   Patient became agitated here and was given Haldol and later some Ativan.  Her blood pressure is quite elevated, unclear if this is related to her delirium or causing her delirium.  CT head does not show any obvious head bleed but is limited.  Discussed with Dr. Selina Cooley, given no MRI availability here at this hour, she recommends blood pressure treatment down to 160 in case this is a stroke and MRI in the morning.  Otherwise, she is afebrile.  No focal deficits.  There is a question of possible hip fracture on the x-ray so a CT will be ordered.  Discussed with Dr. Adrian Blackwater for admission.        Final Clinical Impression(s) / ED Diagnoses Final diagnoses:  Altered mental status, unspecified altered  mental status type    Rx / DC Orders ED Discharge Orders     None         Pricilla Loveless, MD 10/07/22 [phone]

## 2022-10-08 ENCOUNTER — Telehealth: Payer: Self-pay | Admitting: Oncology

## 2022-10-08 DIAGNOSIS — F03B18 Unspecified dementia, moderate, with other behavioral disturbance: Secondary | ICD-10-CM | POA: Diagnosis present

## 2022-10-08 DIAGNOSIS — C569 Malignant neoplasm of unspecified ovary: Secondary | ICD-10-CM | POA: Diagnosis present

## 2022-10-08 DIAGNOSIS — E876 Hypokalemia: Secondary | ICD-10-CM | POA: Diagnosis present

## 2022-10-08 DIAGNOSIS — F05 Delirium due to known physiological condition: Secondary | ICD-10-CM | POA: Diagnosis not present

## 2022-10-08 DIAGNOSIS — R1900 Intra-abdominal and pelvic swelling, mass and lump, unspecified site: Secondary | ICD-10-CM | POA: Diagnosis not present

## 2022-10-08 DIAGNOSIS — Z7189 Other specified counseling: Secondary | ICD-10-CM | POA: Diagnosis not present

## 2022-10-08 DIAGNOSIS — Z79890 Hormone replacement therapy (postmenopausal): Secondary | ICD-10-CM | POA: Diagnosis not present

## 2022-10-08 DIAGNOSIS — Z1152 Encounter for screening for COVID-19: Secondary | ICD-10-CM | POA: Diagnosis not present

## 2022-10-08 DIAGNOSIS — R404 Transient alteration of awareness: Secondary | ICD-10-CM

## 2022-10-08 DIAGNOSIS — I10 Essential (primary) hypertension: Secondary | ICD-10-CM | POA: Diagnosis present

## 2022-10-08 DIAGNOSIS — E039 Hypothyroidism, unspecified: Secondary | ICD-10-CM | POA: Diagnosis present

## 2022-10-08 DIAGNOSIS — C679 Malignant neoplasm of bladder, unspecified: Secondary | ICD-10-CM | POA: Diagnosis present

## 2022-10-08 DIAGNOSIS — Z825 Family history of asthma and other chronic lower respiratory diseases: Secondary | ICD-10-CM | POA: Diagnosis not present

## 2022-10-08 DIAGNOSIS — Z90710 Acquired absence of both cervix and uterus: Secondary | ICD-10-CM | POA: Diagnosis not present

## 2022-10-08 DIAGNOSIS — Z79899 Other long term (current) drug therapy: Secondary | ICD-10-CM | POA: Diagnosis not present

## 2022-10-08 DIAGNOSIS — E785 Hyperlipidemia, unspecified: Secondary | ICD-10-CM | POA: Diagnosis present

## 2022-10-08 DIAGNOSIS — Z7982 Long term (current) use of aspirin: Secondary | ICD-10-CM | POA: Diagnosis not present

## 2022-10-08 DIAGNOSIS — Z800 Family history of malignant neoplasm of digestive organs: Secondary | ICD-10-CM | POA: Diagnosis not present

## 2022-10-08 DIAGNOSIS — D72829 Elevated white blood cell count, unspecified: Secondary | ICD-10-CM

## 2022-10-08 DIAGNOSIS — Z8619 Personal history of other infectious and parasitic diseases: Secondary | ICD-10-CM | POA: Diagnosis not present

## 2022-10-08 DIAGNOSIS — E860 Dehydration: Secondary | ICD-10-CM | POA: Diagnosis present

## 2022-10-08 DIAGNOSIS — Z8249 Family history of ischemic heart disease and other diseases of the circulatory system: Secondary | ICD-10-CM | POA: Diagnosis not present

## 2022-10-08 DIAGNOSIS — Z515 Encounter for palliative care: Secondary | ICD-10-CM | POA: Diagnosis not present

## 2022-10-08 DIAGNOSIS — F03918 Unspecified dementia, unspecified severity, with other behavioral disturbance: Secondary | ICD-10-CM | POA: Diagnosis not present

## 2022-10-08 DIAGNOSIS — Z66 Do not resuscitate: Secondary | ICD-10-CM | POA: Diagnosis present

## 2022-10-08 DIAGNOSIS — G9341 Metabolic encephalopathy: Secondary | ICD-10-CM | POA: Diagnosis present

## 2022-10-08 LAB — VITAMIN B12: Vitamin B-12: 691 pg/mL (ref 180–914)

## 2022-10-08 LAB — BASIC METABOLIC PANEL
Anion gap: 13 (ref 5–15)
BUN: 21 mg/dL (ref 8–23)
CO2: 24 mmol/L (ref 22–32)
Calcium: 9 mg/dL (ref 8.9–10.3)
Chloride: 93 mmol/L — ABNORMAL LOW (ref 98–111)
Creatinine, Ser: 0.96 mg/dL (ref 0.44–1.00)
GFR, Estimated: 57 mL/min — ABNORMAL LOW (ref >60)
Glucose, Bld: 138 mg/dL — ABNORMAL HIGH (ref 70–99)
Potassium: 3.3 mmol/L — ABNORMAL LOW (ref 3.5–5.1)
Sodium: 130 mmol/L — ABNORMAL LOW (ref 135–145)

## 2022-10-08 LAB — CBC
HCT: 44.8 % (ref 36.0–46.0)
Hemoglobin: 14.7 g/dL (ref 12.0–15.0)
MCH: 29.7 pg (ref 26.0–34.0)
MCHC: 32.8 g/dL (ref 30.0–36.0)
MCV: 90.5 fL (ref 80.0–100.0)
Platelets: 268 10*3/uL (ref 150–400)
RBC: 4.95 MIL/uL (ref 3.87–5.11)
RDW: 13 % (ref 11.5–15.5)
WBC: 14.1 10*3/uL — ABNORMAL HIGH (ref 4.0–10.5)
nRBC: 0 % (ref 0.0–0.2)

## 2022-10-08 MED ORDER — ENOXAPARIN SODIUM 30 MG/0.3ML IJ SOSY
30.0000 mg | PREFILLED_SYRINGE | INTRAMUSCULAR | Status: DC
  Administered 2022-10-08: 30 mg via SUBCUTANEOUS
  Filled 2022-10-08: qty 0.3

## 2022-10-08 NOTE — TOC CM/SW Note (Signed)
Transition of Care [facility]) - Inpatient Brief Assessment   Patient Details  Name: Valerie Davenport MRN: [id number] Date of Birth: 1932/01/03  Transition of Care [facility] CM/SW Contact:    Villa Herb, LCSWA Phone Number: 10/08/2022, 9:53 AM   Clinical Narrative: Transition of Care Department [facility]) has reviewed patient and no TOC needs have been identified at this time. We will continue to monitor patient advancement through interdisciplinary progression rounds. If new patient transition needs arise, please place a TOC consult.   Transition of Care Asessment: Insurance and Status: Insurance coverage has been reviewed Patient has primary care physician: Yes Home environment has been reviewed: from home Prior level of function:: family assistance Prior/Current Home Services: No current home services Social Determinants of Health Reivew: SDOH reviewed no interventions necessary Readmission risk has been reviewed: Yes Transition of care needs: transition of care needs identified, TOC will continue to follow

## 2022-10-08 NOTE — Hospital Course (Signed)
89 y.o. female with medical history significant of dementia, hypertension, pelvic mass.  History is primarily from EMS and son.  The patient has had diminished appetite and has not been eating a lot over the past couple of days.  She appeared reasonably well at 7 AM but progressively became more confused.  She became very agitated needing a couple of different medications in order to keep her calm.  While here, she began to scream and was inconsolable.  There did not appear to be any source of her agitation, and needed Haldol and Ativan in order to calm her.  She is currently fairly sedated and unable to provide history.  Her son states that she has otherwise been well.  No fevers, chills.   She does have dementia and has been declining over the past several weeks.  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

## 2022-10-08 NOTE — Progress Notes (Signed)
PROGRESS NOTE   Valerie Davenport  QQV:[id number] DOB: [birth year] DOA: 10/07/2022 PCP: Lavada Mesi, MD   Chief Complaint  Patient presents with   Fall   Level of care: Telemetry  Brief Admission History:  89 y.o. female with medical history significant of dementia, hypertension, pelvic mass.  History is primarily from EMS and son.  The patient has had diminished appetite and has not been eating a lot over the past couple of days.  She appeared reasonably well at 7 AM but progressively became more confused.  She became very agitated needing a couple of different medications in order to keep her calm.  While here, she began to scream and was inconsolable.  There did not appear to be any source of her agitation, and needed Haldol and Ativan in order to calm her.  She is currently fairly sedated and unable to provide history.  Her son states that she has otherwise been well.  No fevers, chills.   She does have dementia and has been declining over the past several weeks.  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   Assessment and Plan:  Acute metabolic encephalopathy  - family reports abrupt change in her mentation, acutely worsening confusion - CT brain negative for acute findings - pt does have advanced dementia at baseline - delirium precautions - UTI : culture pending - no growth to date - TSH and B12 within normal limits   Advanced Dementia  - given acute decompensation, requesting palliative consult for goals of care  Chronic Leukocytosis  - stable for last several years  Pelvic Mass - highly suspicious for ovarian cancer - pt would be very poor candidate for invasive/aggressive treatments - palliative consultation for goals of care  Hypothyroidism  - TSH stable, resumed home levothyroxine   DVT prophylaxis: enoxaparin Code Status: DNR  Family Communication:  Disposition:    Consultants:  Palliative care   Procedures:   Antimicrobials:    Subjective: Pt somnolent and not answering questions this morning  Objective: Vitals:   10/08/22 0127 10/08/22 0406 10/08/22 1002 10/08/22 1418  BP: (!) 172/98 (!) 160/77 132/88 91/72  Pulse: 92  (!) 106 93  Resp:  16  14  Temp: 99 F (37.2 C) 98.8 F (37.1 C)    TempSrc: Oral     SpO2: 98% 99% 97% 100%  Weight:      Height:        Intake/Output Summary (Last 24 hours) at 10/08/2022 1538 Last data filed at 10/08/2022 0849 Gross per 24 hour  Intake 1267.91 ml  Output --  Net 1267.91 ml   Filed Weights   10/07/22 2141  Weight: 45.6 kg   Examination:  General exam: Appears chronically ill. Somnolent.   Respiratory system: Clear to auscultation. Respiratory effort normal. Cardiovascular system: normal S1 & S2 heard.  Gastrointestinal system: Abdomen is nondistended, soft and nontender. No organomegaly or masses felt. Normal bowel sounds heard. Central nervous system: somnolent. No focal neurological deficits. Extremities: Symmetric 5 x 5 power. Skin: No rashes, lesions or ulcers. Psychiatry: UTD  Data Reviewed: I have personally reviewed following labs and imaging studies  CBC: Recent Labs  Lab 10/07/22 1620 10/08/22 0423  WBC 15.6* 14.1*  NEUTROABS 13.7*  --   HGB 15.0 14.7  HCT 45.0 44.8  MCV 90.0 90.5  PLT 278 268    Basic Metabolic Panel: Recent Labs  Lab 10/07/22 1620 10/08/22 0423  NA 132* 130*  K 3.7 3.3*  CL 95* 93*  CO2 24 24  GLUCOSE 103* 138*  BUN 21 21  CREATININE 0.90 0.96  CALCIUM 9.5 9.0    CBG: No results for input(s): "GLUCAP" in the last 168 hours.  Recent Results (from the past 240 hour(s))  SARS Coronavirus 2 by RT PCR (hospital order, performed in [facility] hospital lab) *cepheid single result test* Anterior Nasal Swab     Status: None   Collection Time: 10/07/22  4:20 PM   Specimen: Anterior Nasal Swab  Result Value Ref Range Status   SARS Coronavirus 2 by RT PCR NEGATIVE NEGATIVE  Final    Comment: (NOTE) SARS-CoV-2 target nucleic acids are NOT DETECTED.  The SARS-CoV-2 RNA is generally detectable in upper and lower respiratory specimens during the acute phase of infection. The lowest concentration of SARS-CoV-2 viral copies this assay can detect is 250 copies / mL. A negative result does not preclude SARS-CoV-2 infection and should not be used as the sole basis for treatment or other patient management decisions.  A negative result may occur with improper specimen collection / handling, submission of specimen other than nasopharyngeal swab, presence of viral mutation(s) within the areas targeted by this assay, and inadequate number of viral copies (<250 copies / mL). A negative result must be combined with clinical observations, patient history, and epidemiological information.  Fact Sheet for Patients:   RoadLapTop.co.za  Fact Sheet for Healthcare Providers: http://kim-miller.com/  This test is not yet approved or  cleared by the [place] FDA and has been authorized for detection and/or diagnosis of SARS-CoV-2 by FDA under an Emergency Use Authorization (EUA).  This EUA will remain in effect (meaning this test can be used) for the duration of the COVID-19 declaration under Section 564(b)(1) of the Act, 21 U.S.C. section 360bbb-3(b)(1), unless the authorization is terminated or revoked sooner.  Performed at [facility], [address] [place], [place] [address]      Radiology Studies: CT Hip Right Wo Contrast  Result Date: 10/07/2022 CLINICAL DATA:  Trauma to the right hip a pain. Concern for fracture. EXAM: CT OF THE RIGHT HIP WITHOUT CONTRAST TECHNIQUE: Multidetector CT imaging of the right hip was performed according to the standard protocol. Multiplanar CT image reconstructions were also generated. RADIATION DOSE REDUCTION: This exam was performed according to the departmental dose-optimization program  which includes automated exposure control, adjustment of the mA and/or kV according to patient size and/or use of iterative reconstruction technique. COMPARISON:  Right hip radiograph dated 10/07/2022. FINDINGS: Bones/Joint/Cartilage Subacute appearing fractures of the right superior and inferior pubic rami. No definite acute fracture. Evaluation however is limited due to osteopenia. No dislocation. There is moderate arthritic changes of the right hip. No joint effusion. Ligaments Suboptimally assessed by CT. Muscles and Tendons No acute findings.  No intramuscular fluid collection. Soft tissues No acute findings.  A pessary is partially visualized in the pelvis. IMPRESSION: 1. No acute fracture or dislocation. 2. Subacute appearing fractures of the right superior and inferior pubic rami. 3. Moderate arthritic changes of the right hip. Electronically Signed   By: Elgie Collard M.D.   On: 10/07/2022 22:09   DG Pelvis Portable  Result Date: 10/07/2022 CLINICAL DATA:  Trauma combative altered EXAM: PORTABLE PELVIS 1-2 VIEWS COMPARISON:  03/28/2022 FINDINGS: Limited by technique and positioning. Screw fixation of the proximal left femur. SI joints are non widened. Pubic symphysis appears intact. Old fracture left inferior pubic ramus. Possible acute right  superior pubic ramus fracture. Potential nondisplaced right femoral neck fracture. IMPRESSION: Limited by technique and positioning. Possible acute right superior pubic ramus fracture. Potential nondisplaced right femoral neck fracture. CT recommended for further assessment Electronically Signed   By: Jasmine Pang M.D.   On: 10/07/2022 19:58   CT HEAD WO CONTRAST  Result Date: 10/07/2022 CLINICAL DATA:  Head trauma, moderate-severe; Polytrauma, blunt. Altered mental status. Recent fall. EXAM: CT HEAD WITHOUT CONTRAST CT CERVICAL SPINE WITHOUT CONTRAST TECHNIQUE: Multidetector CT imaging of the head and cervical spine was performed following the standard  protocol without intravenous contrast. Multiplanar CT image reconstructions of the cervical spine were also generated. RADIATION DOSE REDUCTION: This exam was performed according to the departmental dose-optimization program which includes automated exposure control, adjustment of the mA and/or kV according to patient size and/or use of iterative reconstruction technique. COMPARISON:  CT head and cervical spine 08/30/2022 FINDINGS: CT HEAD FINDINGS The study is severely motion degraded despite attempts at repeat imaging and utilization of immobilization devices. Brain: No acute large territory infarct, or gross intracranial hemorrhage, midline shift, or sizeable extra-axial fluid collection is identified. The degree of motion severely reduces sensitivity for detection of small hemorrhages or infarcts. Cerebral atrophy is unchanged. Chronic cerebral white matter disease is not evaluated in detail. Vascular: Calcified atherosclerosis at the skull base. Skull: No displaced skull fracture is identified within limitations of motion artifact. Sinuses/Orbits: The visualized paranasal sinuses and mastoid air cells are clear. Bilateral cataract extraction. Other: None. CT CERVICAL SPINE FINDINGS The study is moderately motion degraded. Alignment: Chronic reversal of the normal cervical lordosis with chronic grade 1 anterolisthesis of C2 on C3 and trace retrolisthesis of C5 on C6. Skull base and vertebrae: No acute fracture is identified within limitations of motion artifact. Advanced C1-2 arthropathy is similar to the prior study, with prominence ligamentous thickening and soft tissue calcification about the dens and with erosions again seen in the dens. Soft tissues and spinal canal: No evidence of significant prevertebral fluid or swelling or gross spinal canal hematoma within limitations of motion artifact. Disc levels: Similar appearance of moderate multilevel disc degeneration and and asymmetrically advanced multilevel  right-sided facet arthrosis. Upper chest: No acute abnormality. Other: Prominent atherosclerotic calcification at the carotid bifurcations. IMPRESSION: 1. Severely motion degraded head CT. No gross acute intracranial abnormality. 2. Moderately motion degraded cervical spine CT. No acute fracture identified. Electronically Signed   By: Sebastian Ache M.D.   On: 10/07/2022 19:55   CT CERVICAL SPINE WO CONTRAST  Result Date: 10/07/2022 CLINICAL DATA:  Head trauma, moderate-severe; Polytrauma, blunt. Altered mental status. Recent fall. EXAM: CT HEAD WITHOUT CONTRAST CT CERVICAL SPINE WITHOUT CONTRAST TECHNIQUE: Multidetector CT imaging of the head and cervical spine was performed following the standard protocol without intravenous contrast. Multiplanar CT image reconstructions of the cervical spine were also generated. RADIATION DOSE REDUCTION: This exam was performed according to the departmental dose-optimization program which includes automated exposure control, adjustment of the mA and/or kV according to patient size and/or use of iterative reconstruction technique. COMPARISON:  CT head and cervical spine 08/30/2022 FINDINGS: CT HEAD FINDINGS The study is severely motion degraded despite attempts at repeat imaging and utilization of immobilization devices. Brain: No acute large territory infarct, or gross intracranial hemorrhage, midline shift, or sizeable extra-axial fluid collection is identified. The degree of motion severely reduces sensitivity for detection of small hemorrhages or infarcts. Cerebral atrophy is unchanged. Chronic cerebral white matter disease is not evaluated in detail. Vascular: Calcified atherosclerosis at  the skull base. Skull: No displaced skull fracture is identified within limitations of motion artifact. Sinuses/Orbits: The visualized paranasal sinuses and mastoid air cells are clear. Bilateral cataract extraction. Other: None. CT CERVICAL SPINE FINDINGS The study is moderately motion  degraded. Alignment: Chronic reversal of the normal cervical lordosis with chronic grade 1 anterolisthesis of C2 on C3 and trace retrolisthesis of C5 on C6. Skull base and vertebrae: No acute fracture is identified within limitations of motion artifact. Advanced C1-2 arthropathy is similar to the prior study, with prominence ligamentous thickening and soft tissue calcification about the dens and with erosions again seen in the dens. Soft tissues and spinal canal: No evidence of significant prevertebral fluid or swelling or gross spinal canal hematoma within limitations of motion artifact. Disc levels: Similar appearance of moderate multilevel disc degeneration and and asymmetrically advanced multilevel right-sided facet arthrosis. Upper chest: No acute abnormality. Other: Prominent atherosclerotic calcification at the carotid bifurcations. IMPRESSION: 1. Severely motion degraded head CT. No gross acute intracranial abnormality. 2. Moderately motion degraded cervical spine CT. No acute fracture identified. Electronically Signed   By: Sebastian Ache M.D.   On: 10/07/2022 19:55   DG Chest Port 1 View  Result Date: 10/07/2022 CLINICAL DATA:  Chest trauma.  Altered mental status and fall. EXAM: PORTABLE CHEST 1 VIEW COMPARISON:  Chest radiograph dated 08/29/2022. FINDINGS: No focal consolidation, pleural effusion, pneumothorax. Minimal left lung base atelectasis/scarring. The cardiac silhouette is within normal limits. Osteopenia with degenerative changes of the spine. No acute osseous pathology. IMPRESSION: No active disease. Electronically Signed   By: Elgie Collard M.D.   On: 10/07/2022 19:53    Scheduled Meds:  enoxaparin (LOVENOX) injection  30 mg Subcutaneous Q24H   levothyroxine  50 mcg Oral QAC breakfast   Continuous Infusions:  lactated ringers 75 mL/hr at 10/08/22 0849     LOS: 0 days   Time spent: 35 mins  Ivyana Locey Laural Benes, MD How to contact the [facility] Attending or Consulting provider 7A - 7P  or covering provider during after hours 7P -7A, for this patient?  Check the care team in [facility] and look for a) attending/consulting [facility] provider listed and b) the [facility] team listed Log into [email or web address] and use [place]'s universal password to access. If you do not have the password, please contact the hospital operator. Locate the [facility] provider you are looking for under [facility] and page to a number that you can be directly reached. If you still have difficulty reaching the provider, please page the [facility] (Director on Call) for the Hospitalists listed on amion for assistance.  10/08/2022, 3:38 PM

## 2022-10-08 NOTE — Telephone Encounter (Signed)
Valerie Davenport regarding Valerie Davenport's appointment with Valerie Davenport tomorrow.  He asked if we can cancel the appointment since Valerie Davenport is in the hospital and not doing well.  He will call back to reschedule once she is doing better.  He also mentioned that she may go to rehab after her hospitalization so it might be a while before they reschedule.

## 2022-10-08 NOTE — Progress Notes (Signed)
Patient arrived to the floor lethargic.  PRN medications were given in ed due to agitation and attempting to get out of bed unasssited.  Patient lives at home with family, and son states she walks around the home frequently and is able to speak.  Patient did not speak tonight, nor open eyes.  However, when patient has bouts of incontinence, patient does maneuver herself in bed and offers resistance when attempting to move patient back in bed or when being cleaned.  Patient has not spoken tonight.  She does make sounds when being moved, as if attempting to vocalize.  Son has been by bedside all night.  No prn medications  given to patient since admit to 300.

## 2022-10-08 NOTE — Progress Notes (Signed)
Palliative-   Consult received- chart reviewed.   Plan to meet with patient's son tomorrow morning at 10am.   Ocie Bob, AGNP-C Palliative Medicine

## 2022-10-09 ENCOUNTER — Inpatient Hospital Stay: Payer: Medicare HMO | Admitting: Gynecologic Oncology

## 2022-10-09 DIAGNOSIS — Z515 Encounter for palliative care: Secondary | ICD-10-CM

## 2022-10-09 DIAGNOSIS — G9341 Metabolic encephalopathy: Secondary | ICD-10-CM

## 2022-10-09 DIAGNOSIS — R1900 Intra-abdominal and pelvic swelling, mass and lump, unspecified site: Secondary | ICD-10-CM

## 2022-10-09 DIAGNOSIS — F03918 Unspecified dementia, unspecified severity, with other behavioral disturbance: Secondary | ICD-10-CM | POA: Diagnosis not present

## 2022-10-09 DIAGNOSIS — Z7189 Other specified counseling: Secondary | ICD-10-CM

## 2022-10-09 DIAGNOSIS — I10 Essential (primary) hypertension: Secondary | ICD-10-CM | POA: Diagnosis not present

## 2022-10-09 DIAGNOSIS — F03B11 Unspecified dementia, moderate, with agitation: Secondary | ICD-10-CM

## 2022-10-09 LAB — BASIC METABOLIC PANEL
Anion gap: 11 (ref 5–15)
BUN: 21 mg/dL (ref 8–23)
CO2: 24 mmol/L (ref 22–32)
Calcium: 9 mg/dL (ref 8.9–10.3)
Chloride: 94 mmol/L — ABNORMAL LOW (ref 98–111)
Creatinine, Ser: 0.78 mg/dL (ref 0.44–1.00)
GFR, Estimated: >60 mL/min (ref >60)
Glucose, Bld: 90 mg/dL (ref 70–99)
Potassium: 3.2 mmol/L — ABNORMAL LOW (ref 3.5–5.1)
Sodium: 129 mmol/L — ABNORMAL LOW (ref 135–145)

## 2022-10-09 LAB — MAGNESIUM: Magnesium: 1.4 mg/dL — ABNORMAL LOW (ref 1.7–2.4)

## 2022-10-09 MED ORDER — OLANZAPINE 5 MG PO TBDP
2.5000 mg | ORAL_TABLET | Freq: Every day | ORAL | Status: DC
  Filled 2022-10-09: qty 0.5

## 2022-10-09 MED ORDER — OLANZAPINE 5 MG PO TBDP: 2.5000 mg | ORAL_TABLET | Freq: Every evening | ORAL | 1 refills | Status: DC

## 2022-10-09 MED ORDER — SENNA 8.6 MG PO TABS: 1.0000 | ORAL_TABLET | Freq: Every day | ORAL | 0 refills | Status: DC

## 2022-10-09 MED ORDER — ACETAMINOPHEN 325 MG PO TABS
650.0000 mg | ORAL_TABLET | Freq: Three times a day (TID) | ORAL | Status: DC
  Administered 2022-10-09 (×2): 650 mg via ORAL
  Filled 2022-10-09 (×2): qty 2

## 2022-10-09 MED ORDER — MORPHINE SULFATE (CONCENTRATE) 10 MG/0.5ML PO SOLN: 5.0000 mg | ORAL | Status: DC | PRN

## 2022-10-09 MED ORDER — MORPHINE SULFATE (CONCENTRATE) 10 MG/0.5ML PO SOLN: 5.0000 mg | ORAL | 0 refills | Status: DC | PRN

## 2022-10-09 MED ORDER — SENNA 8.6 MG PO TABS: 1.0000 | ORAL_TABLET | Freq: Every day | ORAL | Status: DC

## 2022-10-09 NOTE — Consult Note (Signed)
Consultation Note Date: 10/09/2022   Patient Name: Valerie Davenport  DOB: 11-Feb-1931  MRN: [id number]  Age / Sex: 89 y.o., female  PCP: Lavada Mesi, MD Referring Physician: Cleora Fleet, MD  Reason for Consultation:  GOC, progressive decline  HPI/Patient Profile: 89 y.o. female  with past medical history of dementia, HTN, pelvic mass highly suspicious for malignancy admitted on 10/07/2022 with decreased po intake and altered mental status. Palliative consulted for GOC.    Primary Decision Maker NEXT OF KIN - son- Joni Hovell and granddaughter Rico Sheehan  Discussion: Chart reviewed including labs, progress notes, imaging from this and previous encounters.  On eval patient awake and alert. Minimally verbal.  At baseline she is able to ambulate, recognizes family members intermittently, toilets herself.  She has sundowning episodes and intermittent agitation.  Her graddaughter stays with her at night- she stays awake most of the night, roaming the house.  She has had decreased po intake and significant loss of weight.  Family does not believe she would tolerate any treatment for her mass. She was supposed to have appointment with Dr. Pricilla Holm today. We discussed forgoing further workup for mass if it would not change her treatment plan.  I am concerned some of her agitation may be related to pelvic pain that she cannot express due to her dementia.  Given her ongoing decline, likely malignant pelvic mass- we discussed hospice philosophy of care and services.  Family would like referral for hospice- they request [facility] as they have received services from them in the past.      SUMMARY OF RECOMMENDATIONS  -Comfort focused care -Start tylenol 650mg  po TID -Morphine concentrated liquid 5mg  q2hrs prn for pain or agitation -Olanzapine 2.5mg  ODT every evening for sundowning -Given pelvic mass and  initiation of opioids she is high risk for constipation- will start Senna 1 po at bedtime  -Appreciate TOC coordinating with hospice   Code Status/Advance Care Planning: DNR   Prognosis:   < 6 months  Discharge Planning: Home with Hospice  Primary Diagnoses: Present on Admission:  AMS (altered mental status)  Acute metabolic encephalopathy  Essential hypertension  Hypothyroid  Leukocytosis   Review of Systems  Unable to perform ROS: Dementia    Physical Exam Vitals and nursing note reviewed.  Constitutional:      Comments: frail  Abdominal:     General: Abdomen is flat.     Tenderness: There is abdominal tenderness.  Musculoskeletal:     Comments: Diffuse muscle wasting  Neurological:     Mental Status: She is alert.     Comments: Minimally verbal, unable to assess orientation  Psychiatric:        Mood and Affect: Mood normal.     Vital Signs: BP (!) 158/101 (BP Location: Right Arm)   Pulse 96   Temp 98.7 F (37.1 C) (Oral)   Resp 14   Ht 5\' 4"  (1.626 m)   Wt 45.6 kg   SpO2 97%   BMI 17.26 kg/m  Pain Scale:  0-10   Pain Score: 0-No pain   SpO2: SpO2: 97 % O2 Device:SpO2: 97 % O2 Flow Rate: .O2 Flow Rate (L/min): 0 L/min  IO: Intake/output summary:  Intake/Output Summary (Last 24 hours) at 10/09/2022 1102 Last data filed at 10/08/2022 1700 Gross per 24 hour  Intake 609.48 ml  Output --  Net 609.48 ml    LBM: Last BM Date : 10/08/22 Baseline Weight: Weight: 45.6 kg Most recent weight: Weight: 45.6 kg       Thank you for this consult. Palliative medicine will continue to follow and assist as needed.   Greater than 50%  of this time was spent counseling and coordinating care related to the above assessment and plan.  Signed by: Ocie Bob, AGNP-C Palliative Medicine    Please contact Palliative Medicine Team phone at [phone] for questions and concerns.  For individual provider: See Loretha Stapler

## 2022-10-09 NOTE — Discharge Instructions (Signed)
CALL HOSPICE NURSE FIRST BEFORE CALLING 911 OR GOING TO ER

## 2022-10-09 NOTE — Progress Notes (Signed)
Palliative brief note- full consult to follow-   Patient with moderate dementia- able to ambulate, toilet herself at baseline. Has pelvic mass that is very likely malignant.  Met with patient's son and granddaughter.  She has significant sundowning- often walks around her house at night keeping everyone awake. Gets agitated easily. PO intake has decreased and she has lost a significant amount of weight.  Family feels she would not likely tolerate any treatment for pelvic mass.  I have concerns some of her agitation may be related to pain from pelvic mass that she is unable to express.  Plan is to focus on patient's symptoms. Avoid further workup for pelvic mass.  Would like support from hospice at home.   Start: - tylenol 650mg  TID po -morphine concentrated liquid 5mg  q2hr prn -olazapine 2.5mg  ODT every evening  Appreciate TOC coordinating hospice at home.   Ocie Bob, AGNP-C Palliative Medicine  No charge note

## 2022-10-09 NOTE — Care Management Important Message (Signed)
Important Message  Patient Details  Name: Valerie Davenport MRN: [id number] Date of Birth: 05-Nov-1931   Medicare Important Message Given:  N/A - LOS <3 / Initial given by admissions     Corey Harold 10/09/2022, 3:15 PM

## 2022-10-09 NOTE — Discharge Summary (Signed)
Physician Discharge Summary  Valerie Davenport:[id number] DOB: December 16, 1931 DOA: 10/07/2022  PCP: Lavada Mesi, MD  Admit date: 10/07/2022 Discharge date: 10/09/2022  Admitted From:  Home  Disposition: Home with Hospice Care (full comfort)  Recommendations for Outpatient Follow-up:  Symptom management per hospice protocols  Home Health:  Hospice   Discharge Condition: Hospice   CODE STATUS: DNR  DIET: Full Comfort Care    Brief Hospitalization Summary: Please see all hospital notes, images, labs for full details of the hospitalization. Admission provider HPI:  89 y.o. female with medical history significant of dementia, hypertension, pelvic mass.  History is primarily from EMS and son.  The patient has had diminished appetite and has not been eating a lot over the past couple of days.  She appeared reasonably well at 7 AM but progressively became more confused.  She became very agitated needing a couple of different medications in order to keep her calm.  While here, she began to scream and was inconsolable.  There did not appear to be any source of her agitation, and needed Haldol and Ativan in order to calm her.  She is currently fairly sedated and unable to provide history.  Her son states that she has otherwise been well.  No fevers, chills.   She does have dementia and has been declining over the past several weeks.  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  Hospital Course  This patient was admitted for worsening encephalopathy and for workup for acute metabolic encephalopathy and workup has been unremarkable with the exception of mild dehydration and hypokalemia.  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  A palliative medicine consultation was requested for discussion of goals of care.  The patient's family met with palliative  provider and goals of care discussed and after those discussions family decided to go with home hospice full comfort care.  They requested [facility] who has met with family and evaluated patient and made arrangements for home equipment to be delivered.  Comfort care orders initiated by palliative team.  Pt is being discharged to hospice care with symptom management per hospice protocols recommended.    Discharge Diagnoses:  Principal Problem:   AMS (altered mental status) Active Problems:   Essential hypertension   Hypothyroid   Leukocytosis   Acute metabolic encephalopathy   Dementia with behavioral disturbance [facility])   Hospice care patient   Discharge Instructions:  Allergies as of 10/09/2022   No Known Allergies      Medication List     STOP taking these medications    aspirin EC 81 MG tablet   K2-D3 MAX PO   traZODone 50 MG tablet Commonly known as: DESYREL   VITAMIN B-12 PO       TAKE these medications    acetaminophen 325 MG tablet Commonly known as: TYLENOL Take 2 tablets (650 mg total) by mouth in the morning, at noon, and at bedtime.   levothyroxine 50 MCG tablet Commonly known as: SYNTHROID TAKE 1 TABLET (50 MCG TOTAL) BY MOUTH DAILY BEFORE BREAKFAST.   morphine CONCENTRATE 10 MG/0.5ML Soln concentrated solution Place 0.25 mLs (5 mg total) under the tongue every 2 (two) hours as needed for moderate pain, severe pain, anxiety or shortness of breath.   OLANZapine zydis 5 MG disintegrating tablet Commonly known as: ZYPREXA Take 0.5 tablets (2.5  mg total) by mouth every evening.   senna 8.6 MG Tabs tablet Commonly known as: SENOKOT Take 1 tablet (8.6 mg total) by mouth at bedtime.        No Known Allergies Allergies as of 10/09/2022   No Known Allergies      Medication List     STOP taking these medications    aspirin EC 81 MG tablet   K2-D3 MAX PO   traZODone 50 MG tablet Commonly known as: DESYREL   VITAMIN B-12 PO        TAKE these medications    acetaminophen 325 MG tablet Commonly known as: TYLENOL Take 2 tablets (650 mg total) by mouth in the morning, at noon, and at bedtime.   levothyroxine 50 MCG tablet Commonly known as: SYNTHROID TAKE 1 TABLET (50 MCG TOTAL) BY MOUTH DAILY BEFORE BREAKFAST.   morphine CONCENTRATE 10 MG/0.5ML Soln concentrated solution Place 0.25 mLs (5 mg total) under the tongue every 2 (two) hours as needed for moderate pain, severe pain, anxiety or shortness of breath.   OLANZapine zydis 5 MG disintegrating tablet Commonly known as: ZYPREXA Take 0.5 tablets (2.5 mg total) by mouth every evening.   senna 8.6 MG Tabs tablet Commonly known as: SENOKOT Take 1 tablet (8.6 mg total) by mouth at bedtime.        Procedures/Studies: CT Hip Right Wo Contrast  Result Date: 10/07/2022 CLINICAL DATA:  Trauma to the right hip a pain. Concern for fracture. EXAM: CT OF THE RIGHT HIP WITHOUT CONTRAST TECHNIQUE: Multidetector CT imaging of the right hip was performed according to the standard protocol. Multiplanar CT image reconstructions were also generated. RADIATION DOSE REDUCTION: This exam was performed according to the departmental dose-optimization program which includes automated exposure control, adjustment of the mA and/or kV according to patient size and/or use of iterative reconstruction technique. COMPARISON:  Right hip radiograph dated 10/07/2022. FINDINGS: Bones/Joint/Cartilage Subacute appearing fractures of the right superior and inferior pubic rami. No definite acute fracture. Evaluation however is limited due to osteopenia. No dislocation. There is moderate arthritic changes of the right hip. No joint effusion. Ligaments Suboptimally assessed by CT. Muscles and Tendons No acute findings.  No intramuscular fluid collection. Soft tissues No acute findings.  A pessary is partially visualized in the pelvis. IMPRESSION: 1. No acute fracture or dislocation. 2. Subacute appearing  fractures of the right superior and inferior pubic rami. 3. Moderate arthritic changes of the right hip. Electronically Signed   By: Elgie Collard M.D.   On: 10/07/2022 22:09   DG Pelvis Portable  Result Date: 10/07/2022 CLINICAL DATA:  Trauma combative altered EXAM: PORTABLE PELVIS 1-2 VIEWS COMPARISON:  03/28/2022 FINDINGS: Limited by technique and positioning. Screw fixation of the proximal left femur. SI joints are non widened. Pubic symphysis appears intact. Old fracture left inferior pubic ramus. Possible acute right superior pubic ramus fracture. Potential nondisplaced right femoral neck fracture. IMPRESSION: Limited by technique and positioning. Possible acute right superior pubic ramus fracture. Potential nondisplaced right femoral neck fracture. CT recommended for further assessment Electronically Signed   By: Jasmine Pang M.D.   On: 10/07/2022 19:58   CT HEAD WO CONTRAST  Result Date: 10/07/2022 CLINICAL DATA:  Head trauma, moderate-severe; Polytrauma, blunt. Altered mental status. Recent fall. EXAM: CT HEAD WITHOUT CONTRAST CT CERVICAL SPINE WITHOUT CONTRAST TECHNIQUE: Multidetector CT imaging of the head and cervical spine was performed following the standard protocol without intravenous contrast. Multiplanar CT image reconstructions of the cervical spine were also generated.  RADIATION DOSE REDUCTION: This exam was performed according to the departmental dose-optimization program which includes automated exposure control, adjustment of the mA and/or kV according to patient size and/or use of iterative reconstruction technique. COMPARISON:  CT head and cervical spine 08/30/2022 FINDINGS: CT HEAD FINDINGS The study is severely motion degraded despite attempts at repeat imaging and utilization of immobilization devices. Brain: No acute large territory infarct, or gross intracranial hemorrhage, midline shift, or sizeable extra-axial fluid collection is identified. The degree of motion severely  reduces sensitivity for detection of small hemorrhages or infarcts. Cerebral atrophy is unchanged. Chronic cerebral white matter disease is not evaluated in detail. Vascular: Calcified atherosclerosis at the skull base. Skull: No displaced skull fracture is identified within limitations of motion artifact. Sinuses/Orbits: The visualized paranasal sinuses and mastoid air cells are clear. Bilateral cataract extraction. Other: None. CT CERVICAL SPINE FINDINGS The study is moderately motion degraded. Alignment: Chronic reversal of the normal cervical lordosis with chronic grade 1 anterolisthesis of C2 on C3 and trace retrolisthesis of C5 on C6. Skull base and vertebrae: No acute fracture is identified within limitations of motion artifact. Advanced C1-2 arthropathy is similar to the prior study, with prominence ligamentous thickening and soft tissue calcification about the dens and with erosions again seen in the dens. Soft tissues and spinal canal: No evidence of significant prevertebral fluid or swelling or gross spinal canal hematoma within limitations of motion artifact. Disc levels: Similar appearance of moderate multilevel disc degeneration and and asymmetrically advanced multilevel right-sided facet arthrosis. Upper chest: No acute abnormality. Other: Prominent atherosclerotic calcification at the carotid bifurcations. IMPRESSION: 1. Severely motion degraded head CT. No gross acute intracranial abnormality. 2. Moderately motion degraded cervical spine CT. No acute fracture identified. Electronically Signed   By: Sebastian Ache M.D.   On: 10/07/2022 19:55   CT CERVICAL SPINE WO CONTRAST  Result Date: 10/07/2022 CLINICAL DATA:  Head trauma, moderate-severe; Polytrauma, blunt. Altered mental status. Recent fall. EXAM: CT HEAD WITHOUT CONTRAST CT CERVICAL SPINE WITHOUT CONTRAST TECHNIQUE: Multidetector CT imaging of the head and cervical spine was performed following the standard protocol without intravenous  contrast. Multiplanar CT image reconstructions of the cervical spine were also generated. RADIATION DOSE REDUCTION: This exam was performed according to the departmental dose-optimization program which includes automated exposure control, adjustment of the mA and/or kV according to patient size and/or use of iterative reconstruction technique. COMPARISON:  CT head and cervical spine 08/30/2022 FINDINGS: CT HEAD FINDINGS The study is severely motion degraded despite attempts at repeat imaging and utilization of immobilization devices. Brain: No acute large territory infarct, or gross intracranial hemorrhage, midline shift, or sizeable extra-axial fluid collection is identified. The degree of motion severely reduces sensitivity for detection of small hemorrhages or infarcts. Cerebral atrophy is unchanged. Chronic cerebral white matter disease is not evaluated in detail. Vascular: Calcified atherosclerosis at the skull base. Skull: No displaced skull fracture is identified within limitations of motion artifact. Sinuses/Orbits: The visualized paranasal sinuses and mastoid air cells are clear. Bilateral cataract extraction. Other: None. CT CERVICAL SPINE FINDINGS The study is moderately motion degraded. Alignment: Chronic reversal of the normal cervical lordosis with chronic grade 1 anterolisthesis of C2 on C3 and trace retrolisthesis of C5 on C6. Skull base and vertebrae: No acute fracture is identified within limitations of motion artifact. Advanced C1-2 arthropathy is similar to the prior study, with prominence ligamentous thickening and soft tissue calcification about the dens and with erosions again seen in the dens. Soft tissues and spinal  canal: No evidence of significant prevertebral fluid or swelling or gross spinal canal hematoma within limitations of motion artifact. Disc levels: Similar appearance of moderate multilevel disc degeneration and and asymmetrically advanced multilevel right-sided facet arthrosis.  Upper chest: No acute abnormality. Other: Prominent atherosclerotic calcification at the carotid bifurcations. IMPRESSION: 1. Severely motion degraded head CT. No gross acute intracranial abnormality. 2. Moderately motion degraded cervical spine CT. No acute fracture identified. Electronically Signed   By: Sebastian Ache M.D.   On: 10/07/2022 19:55   DG Chest Port 1 View  Result Date: 10/07/2022 CLINICAL DATA:  Chest trauma.  Altered mental status and fall. EXAM: PORTABLE CHEST 1 VIEW COMPARISON:  Chest radiograph dated 08/29/2022. FINDINGS: No focal consolidation, pleural effusion, pneumothorax. Minimal left lung base atelectasis/scarring. The cardiac silhouette is within normal limits. Osteopenia with degenerative changes of the spine. No acute osseous pathology. IMPRESSION: No active disease. Electronically Signed   By: Elgie Collard M.D.   On: 10/07/2022 19:53     Subjective: Pt more alert, no specific complaints but remains very confused/disoriented.   Discharge Exam: Vitals:   10/09/22 0300 10/09/22 1242  BP: (!) 158/101 (!) 142/98  Pulse: 96 87  Resp:    Temp: 98.7 F (37.1 C) (!) 97.5 F (36.4 C)  SpO2: 97% 97%   Vitals:   10/08/22 1418 10/08/22 2102 10/09/22 0300 10/09/22 1242  BP: 91/72 (!) 165/91 (!) 158/101 (!) 142/98  Pulse: 93 79 96 87  Resp: 14     Temp:  98.2 F (36.8 C) 98.7 F (37.1 C) (!) 97.5 F (36.4 C)  TempSrc:  Oral Oral Oral  SpO2: 100% 97% 97% 97%  Weight:      Height:       General: Pt is alert, awake, not in acute distress Cardiovascular: normal S1/S2 +, no rubs, no gallops Respiratory: CTA bilaterally, no wheezing, no rhonchi Abdominal: Soft, NT, ND, bowel sounds + Extremities: no edema, no cyanosis   The results of significant diagnostics from this hospitalization (including imaging, microbiology, ancillary and laboratory) are listed below for reference.     Microbiology: Recent Results (from the past 240 hour(s))  SARS Coronavirus 2 by RT  PCR (hospital order, performed in [facility] hospital lab) *cepheid single result test* Anterior Nasal Swab     Status: None   Collection Time: 10/07/22  4:20 PM   Specimen: Anterior Nasal Swab  Result Value Ref Range Status   SARS Coronavirus 2 by RT PCR NEGATIVE NEGATIVE Final    Comment: (NOTE) SARS-CoV-2 target nucleic acids are NOT DETECTED.  The SARS-CoV-2 RNA is generally detectable in upper and lower respiratory specimens during the acute phase of infection. The lowest concentration of SARS-CoV-2 viral copies this assay can detect is 250 copies / mL. A negative result does not preclude SARS-CoV-2 infection and should not be used as the sole basis for treatment or other patient management decisions.  A negative result may occur with improper specimen collection / handling, submission of specimen other than nasopharyngeal swab, presence of viral mutation(s) within the areas targeted by this assay, and inadequate number of viral copies (<250 copies / mL). A negative result must be combined with clinical observations, patient history, and epidemiological information.  Fact Sheet for Patients:   RoadLapTop.co.za  Fact Sheet for Healthcare Providers: http://kim-miller.com/  This test is not yet approved or  cleared by the [place] FDA and has been authorized for detection and/or diagnosis of SARS-CoV-2 by FDA under an Emergency Use Authorization (  EUA).  This EUA will remain in effect (meaning this test can be used) for the duration of the COVID-19 declaration under Section 564(b)(1) of the Act, 21 U.S.C. section 360bbb-3(b)(1), unless the authorization is terminated or revoked sooner.  Performed at [facility], [address] [place], [place] [address]      Labs: BNP (last 3 results) No results for input(s): "BNP" in the last 8760 hours. Basic Metabolic Panel: Recent Labs  Lab 10/07/22 1620 10/08/22 0423 10/09/22 0801   NA 132* 130* 129*  K 3.7 3.3* 3.2*  CL 95* 93* 94*  CO2 24 24 24   GLUCOSE 103* 138* 90  BUN 21 21 21   CREATININE 0.90 0.96 0.78  CALCIUM 9.5 9.0 9.0  MG  --   --  1.4*   Liver Function Tests: Recent Labs  Lab 10/07/22 1620  AST 21  ALT 21  ALKPHOS 90  BILITOT 1.2  PROT 7.6  ALBUMIN 4.2   No results for input(s): "LIPASE", "AMYLASE" in the last 168 hours. No results for input(s): "AMMONIA" in the last 168 hours. CBC: Recent Labs  Lab 10/07/22 1620 10/08/22 0423  WBC 15.6* 14.1*  NEUTROABS 13.7*  --   HGB 15.0 14.7  HCT 45.0 44.8  MCV 90.0 90.5  PLT 278 268   Cardiac Enzymes: No results for input(s): "CKTOTAL", "CKMB", "CKMBINDEX", "TROPONINI" in the last 168 hours. BNP: Invalid input(s): "POCBNP" CBG: No results for input(s): "GLUCAP" in the last 168 hours. D-Dimer No results for input(s): "DDIMER" in the last 72 hours. Hgb A1c No results for input(s): "HGBA1C" in the last 72 hours. Lipid Profile No results for input(s): "CHOL", "HDL", "LDLCALC", "TRIG", "CHOLHDL", "LDLDIRECT" in the last 72 hours. Thyroid function studies Recent Labs    10/07/22 1620  TSH 1.269   Anemia work up Recent Labs    10/08/22 0423  VITAMINB12 691   Urinalysis    Component Value Date/Time   COLORURINE YELLOW 10/07/2022 1620   APPEARANCEUR CLEAR 10/07/2022 1620   LABSPEC 1.012 10/07/2022 1620   PHURINE 7.0 10/07/2022 1620   GLUCOSEU NEGATIVE 10/07/2022 1620   HGBUR NEGATIVE 10/07/2022 1620   BILIRUBINUR NEGATIVE 10/07/2022 1620   KETONESUR 5 (A) 10/07/2022 1620   PROTEINUR 30 (A) 10/07/2022 1620   UROBILINOGEN 1.0 02/18/2012 1307   NITRITE NEGATIVE 10/07/2022 1620   LEUKOCYTESUR NEGATIVE 10/07/2022 1620   Sepsis Labs Recent Labs  Lab 10/07/22 1620 10/08/22 0423  WBC 15.6* 14.1*   Microbiology Recent Results (from the past 240 hour(s))  SARS Coronavirus 2 by RT PCR (hospital order, performed in [facility] hospital lab) *cepheid single result test* Anterior  Nasal Swab     Status: None   Collection Time: 10/07/22  4:20 PM   Specimen: Anterior Nasal Swab  Result Value Ref Range Status   SARS Coronavirus 2 by RT PCR NEGATIVE NEGATIVE Final    Comment: (NOTE) SARS-CoV-2 target nucleic acids are NOT DETECTED.  The SARS-CoV-2 RNA is generally detectable in upper and lower respiratory specimens during the acute phase of infection. The lowest concentration of SARS-CoV-2 viral copies this assay can detect is 250 copies / mL. A negative result does not preclude SARS-CoV-2 infection and should not be used as the sole basis for treatment or other patient management decisions.  A negative result may occur with improper specimen collection / handling, submission of specimen other than nasopharyngeal swab, presence of viral mutation(s) within the areas targeted by this assay, and inadequate number of viral copies (<250 copies /  mL). A negative result must be combined with clinical observations, patient history, and epidemiological information.  Fact Sheet for Patients:   RoadLapTop.co.za  Fact Sheet for Healthcare Providers: http://kim-miller.com/  This test is not yet approved or  cleared by the [place] FDA and has been authorized for detection and/or diagnosis of SARS-CoV-2 by FDA under an Emergency Use Authorization (EUA).  This EUA will remain in effect (meaning this test can be used) for the duration of the COVID-19 declaration under Section 564(b)(1) of the Act, 21 U.S.C. section 360bbb-3(b)(1), unless the authorization is terminated or revoked sooner.  Performed at [facility], [address], [place], [place] [address]    Time coordinating discharge: 42 mins   SIGNED:  Standley Dakins, MD  [facility] 10/09/2022, 3:14 PM How to contact the [facility] Attending or Consulting provider 7A - 7P or covering provider during after hours 7P -7A, for this patient?  Check the care team in [facility]  and look for a) attending/consulting [facility] provider listed and b) the [facility] team listed Log into [email or web address] and use [place]'s universal password to access. If you do not have the password, please contact the hospital operator. Locate the [facility] provider you are looking for under [facility] and page to a number that you can be directly reached. If you still have difficulty reaching the provider, please page the [facility] (Director on Call) for the Hospitalists listed on amion for assistance.

## 2022-10-09 NOTE — TOC Initial Note (Addendum)
Transition of Care [facility]) - Initial/Assessment Note    Patient Details  Name: Valerie Davenport MRN: [id number] Date of Birth: Mar 12, 1931  Transition of Care [facility]) CM/SW Contact:    Elliot Gault, LCSW Phone Number: 10/09/2022, 10:39 AM  Clinical Narrative:                  Jayme Cloud consult requesting referral to [facility] for care at home at the request of pt family.  Referral made to [facility] who states she will review clinical and connect with pt's family.   Will follow up once return call from hospice received.  1213: Keka at [facility] states that pt is accepted for hospice care at home and they are arranging for DME to be delivered later today.   Spoke with pt's son who states that pt's granddaughter will transport pt home once DME in place. Updated RN and MD.  Expected Discharge Plan: Home w Hospice Care Barriers to Discharge: Continued Medical Work up   Patient Goals and CMS Choice Patient states their goals for this hospitalization and ongoing recovery are:: hospice CMS Medicare.gov Compare Post Acute Care list provided to:: Patient Represenative (must comment) Choice offered to / list presented to : Adult Children      Expected Discharge Plan and Services In-house Referral: Clinical Social Work   Post Acute Care Choice: Hospice Living arrangements for the past 2 months: Single Family Home                                      Prior Living Arrangements/Services Living arrangements for the past 2 months: Single Family Home Lives with:: Adult Children Patient language and need for interpreter reviewed:: Yes Do you feel safe going back to the place where you live?: Yes      Need for Family Participation in Patient Care: Yes (Comment) Care giver support system in place?: Yes (comment)   Criminal Activity/Legal Involvement Pertinent to Current Situation/Hospitalization: No - Comment as needed  Activities of Daily Living Home Assistive  Devices/Equipment: Walker (specify type), Grab bars around toilet, Grab bars in shower, Shower chair with back ADL Screening (condition at time of admission) Patient's cognitive ability adequate to safely complete daily activities?: No Is the patient deaf or have difficulty hearing?: No Does the patient have difficulty seeing, even when wearing glasses/contacts?: No Does the patient have difficulty concentrating, remembering, or making decisions?: No Patient able to express need for assistance with ADLs?: Yes Does the patient have difficulty dressing or bathing?: Yes Independently performs ADLs?: No Communication: Independent Dressing (OT): Independent Grooming: Independent Feeding: Independent Bathing: Needs assistance (son reports grandaughter come sin to help with bathing a few times a week) Is this a change from baseline?: Pre-admission baseline Toileting: Independent In/Out Bed: Independent Walks in Home: Independent with device (comment) (uses walker per son) Does the patient have difficulty walking or climbing stairs?: Yes Weakness of Legs: Both Weakness of Arms/Hands: None  Permission Sought/Granted Permission sought to share information with : Oceanographer granted to share information with : Yes, Verbal Permission Granted     Permission granted to share info w AGENCY: hospice        Emotional Assessment       Orientation: : Oriented to Self Alcohol / Substance Use: Not Applicable Psych Involvement: No (comment)  Admission diagnosis:  AMS (altered mental status) [R41.82] Acute metabolic encephalopathy [G93.41] Patient Active  Problem List   Diagnosis Date Noted   AMS (altered mental status) 10/07/2022   Dementia with behavioral disturbance (HCC) 10/07/2022   Hip fracture (HCC) 04/28/2021   Leukocytosis 04/28/2021   Fall at home 04/28/2021   Acute metabolic encephalopathy 04/28/2021   Hypomagnesemia 04/28/2021   Hypothyroid  06/20/2019   Headache 03/12/2019   TIA (transient ischemic attack) 03/11/2019   Essential hypertension 11/10/2018   HNP (herniated nucleus pulposus), lumbar 02/20/2012    Class: Diagnosis of   PCP:  Lavada Mesi, MD Pharmacy:   [facility] [phone] - [place], [place] - [address] AT [facility] OF [address] [place] [address] Phone: [phone] Fax: [phone]  [facility] - [place], [place] - [address] [place] [address] Phone: [phone] Fax: [phone]  [facility] - [place], [place] - [address] [place] [address] Phone: [phone] Fax: [phone]     Social Determinants of Health (SDOH) Social History: SDOH Screenings   Food Insecurity: No Food Insecurity (10/07/2022)  Housing: Low Risk  (10/07/2022)  Transportation Needs: No Transportation Needs (10/07/2022)  Utilities: Not At Risk (10/07/2022)  Depression (PHQ2-9): Low Risk  (12/13/2018)  Recent Concern: Depression (PHQ2-9) - Medium Risk (11/20/2018)  Tobacco Use: Low Risk  (10/07/2022)   SDOH Interventions:     Readmission Risk Interventions     No data to display

## 2022-10-10 ENCOUNTER — Other Ambulatory Visit: Payer: Self-pay | Admitting: Family Medicine

## 2022-10-10 MED ORDER — MORPHINE SULFATE (CONCENTRATE) 10 MG/0.5ML PO SOLN: 5.0000 mg | ORAL | 0 refills | Status: DC | PRN

## 2022-10-10 NOTE — Progress Notes (Signed)
Pharmacy called for this patient in regard to rx: Morphine Sulfate (MORPHINE CONCENTRATE) 10 MG/0.5ML SOLN concentrated solution [[id number]], stated that they do not have this amount in stock, they have the 30 ml if wanting to switch to that then they would need a new prescription sent in. Pharmacy call back #[phone]   New Rx sent to [facility] for 30 ml supply of MS concentrate 10 mg/0.5 mL  C. Laural Benes, MD How to contact the [facility] Attending or Consulting provider 7A - 7P or covering provider during after hours 7P -7A, for this patient?  Check the care team in [facility] and look for a) attending/consulting TRH provider listed and b) the [facility] team listed Log into [email or web address] and use [facility]'s universal password to access. If you do not have the password, please contact the hospital operator. Locate the [facility] provider you are looking for under [facility] and page to a number that you can be directly reached. If you still have difficulty reaching the provider, please page the [facility] (Director on Call) for the Hospitalists listed on amion for assistance.

## 2022-10-28 DEATH — deceased

## 2023-04-14 IMAGING — MG DIGITAL DIAGNOSTIC BILAT W/ TOMO W/ CAD
6 of 12 series · 6 of 36 positions shown · non-contrast
Comparison: Previous exam(s).

CLINICAL DATA: 89-year-old presenting with an inverted, discolored
LEFT nipple. The patient is unsure as to how long the nipple
inversion and discoloration has been present. Annual evaluation,
RIGHT breast.

EXAM:
DIGITAL DIAGNOSTIC BILATERAL MAMMOGRAM WITH TOMOSYNTHESIS AND CAD
TECHNIQUE: Bilateral digital diagnostic mammography and breast tomosynthesis
was performed. The images were evaluated with computer-aided
detection.

[R CC synth-2D (1 of 2)]
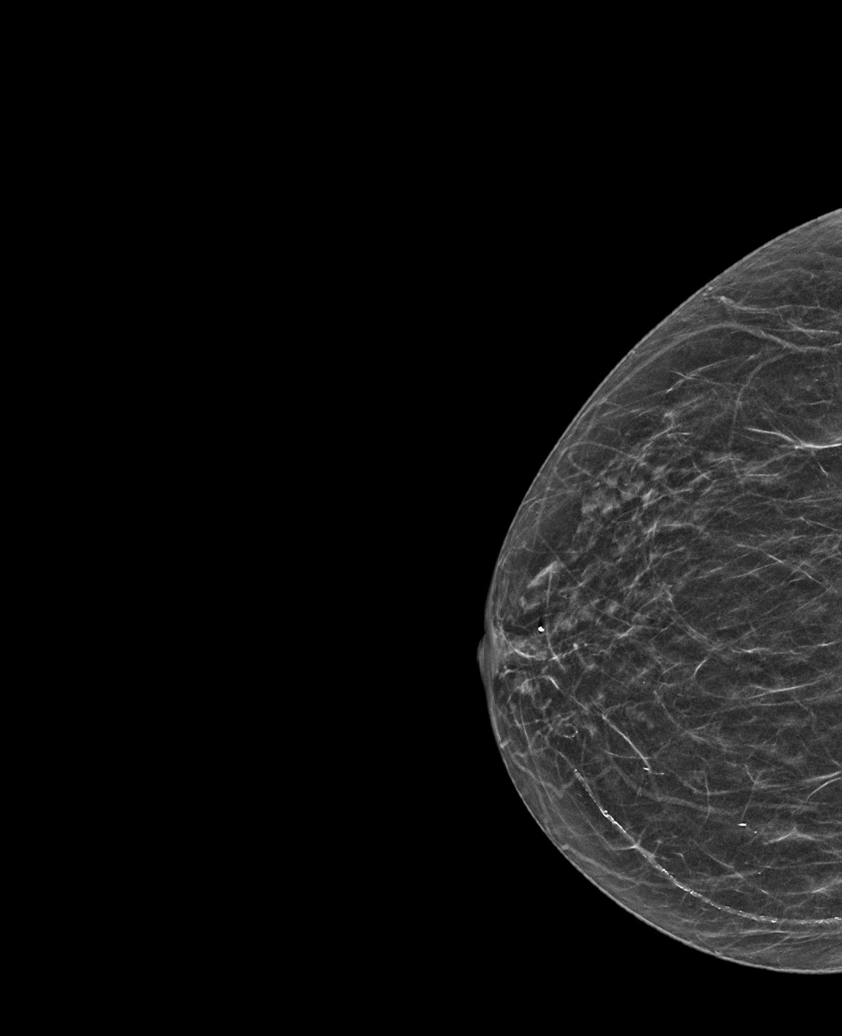

[L CC synth-2D (1 of 2)]
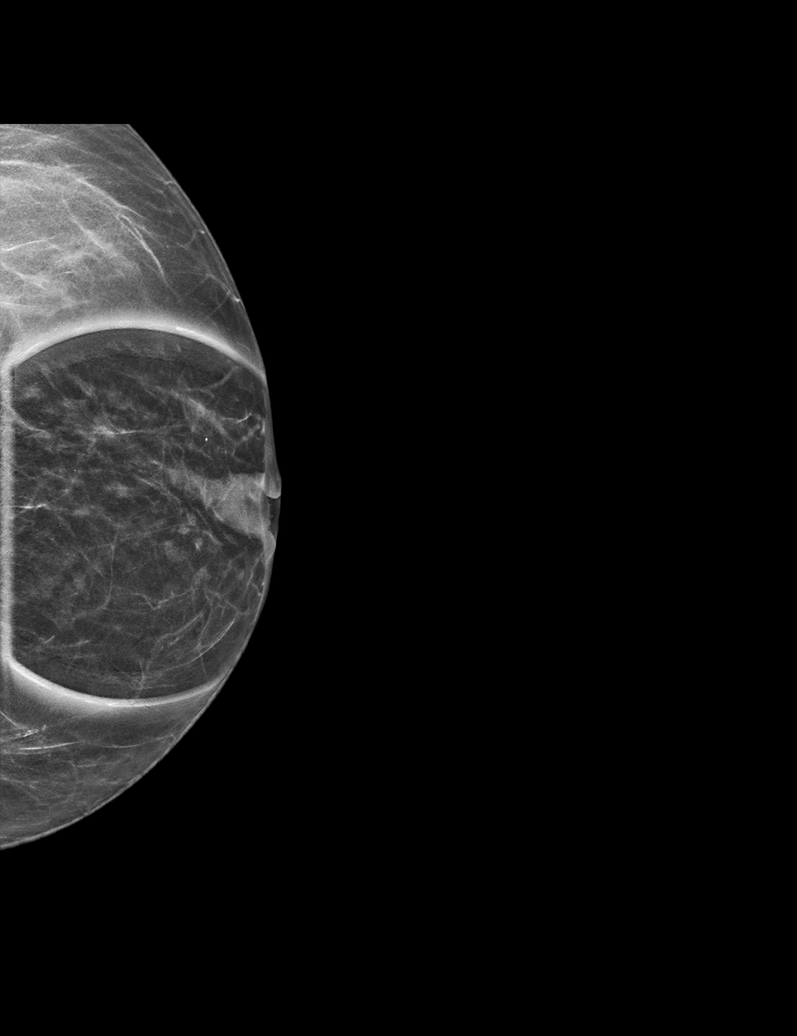

[L CC synth-2D (2 of 2)]
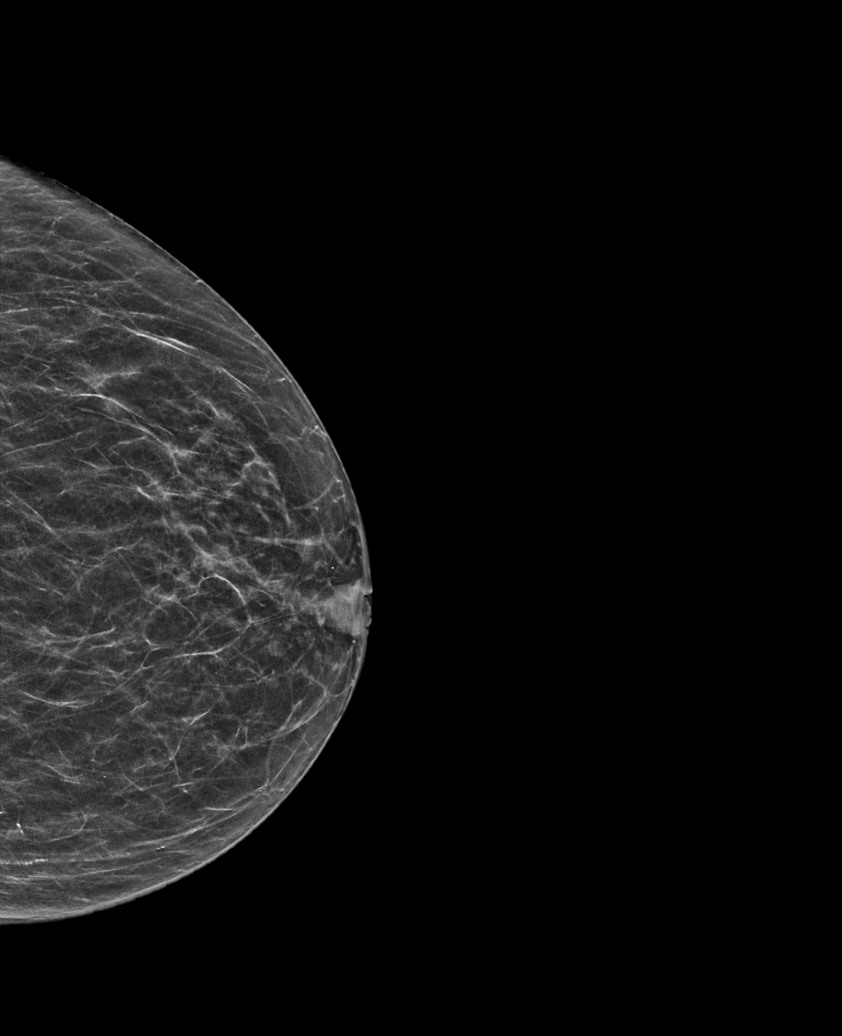

[R MLO synth-2D]
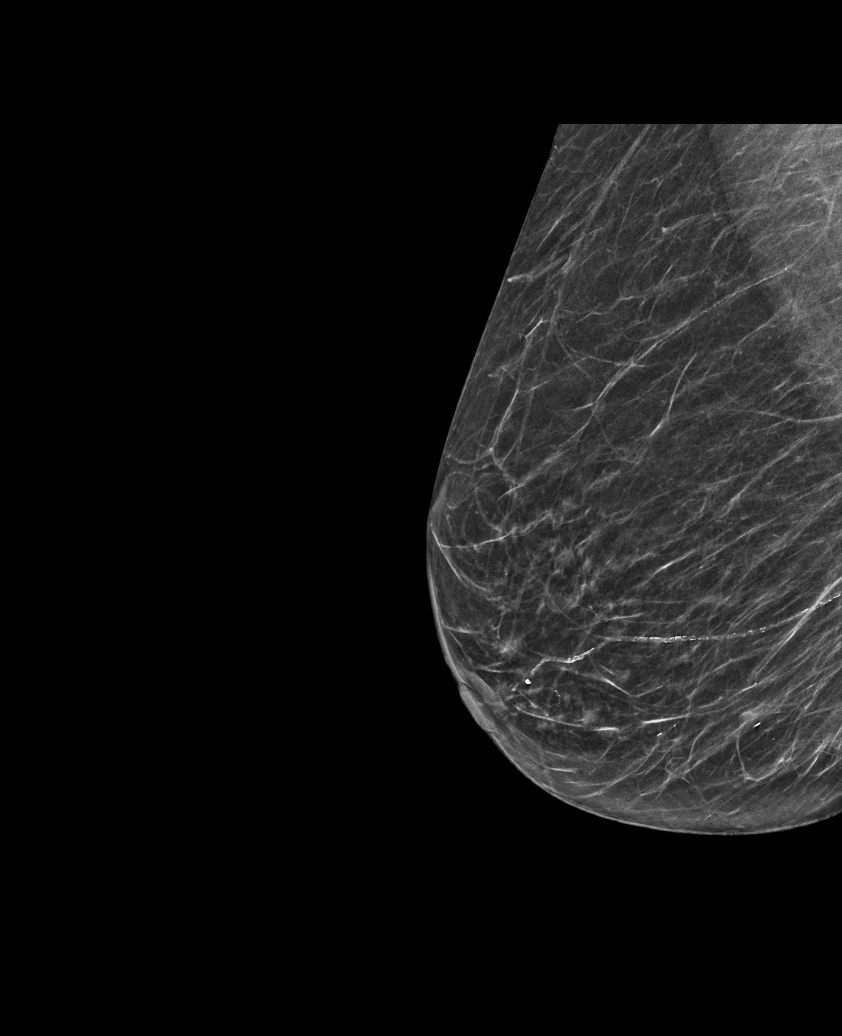

[R CC synth-2D (2 of 2)]
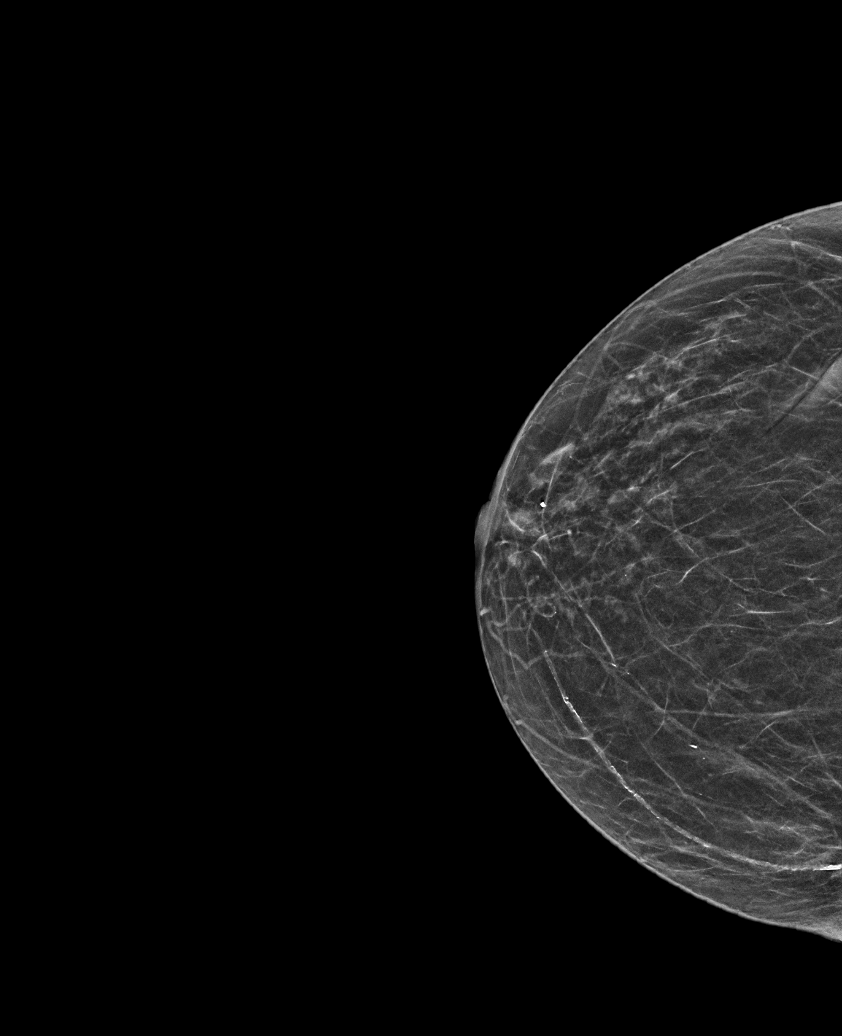

[L MLO synth-2D]
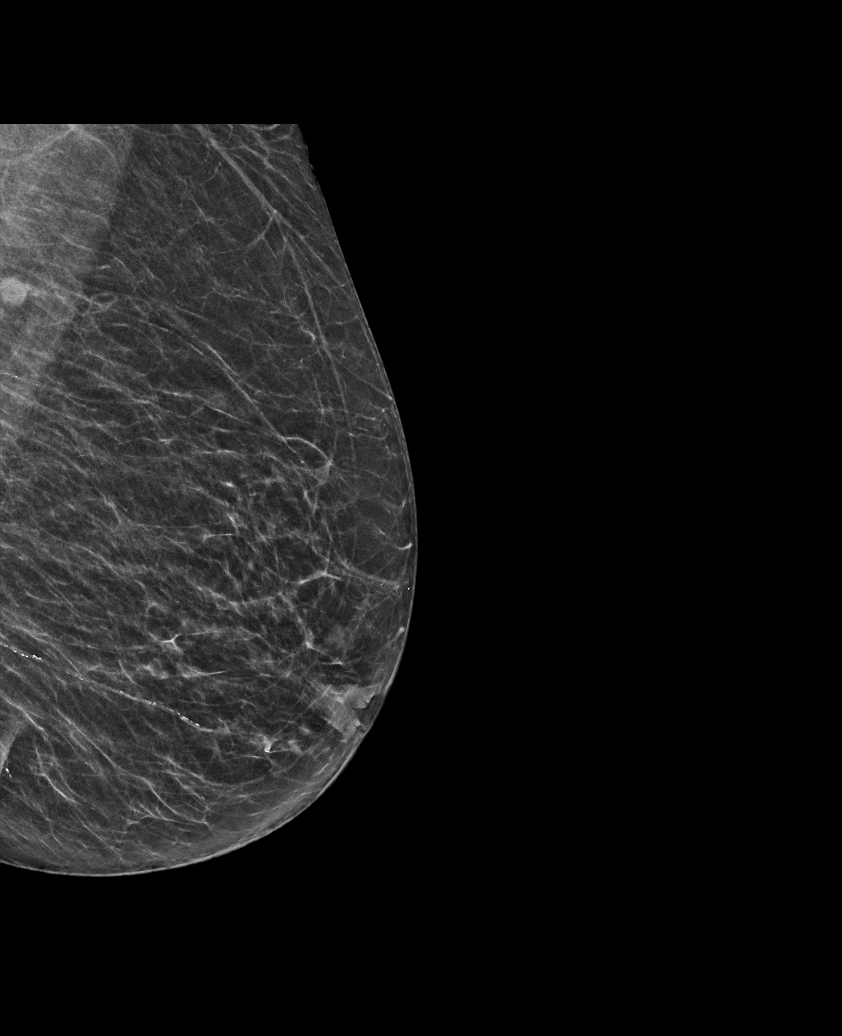

[6 of 36 positions shown; findings below may reference images not displayed]

ACR Breast Density Category b: There are scattered areas of
fibroglandular density.
FINDINGS: Full field CC and MLO views of both breasts and a spot compression
view of the subareolar LEFT breast were obtained.

RIGHT: No findings suspicious for malignancy.

LEFT: The nipple has been inverted on prior mammograms dating back
to 0294, so this is not an acute finding. No findings suspicious for
malignancy.

On correlative physical examination, I was able to easily Ndilie the
LEFT nipple. The discoloration on the nipple is related to
inspissated debris, as I was able to remove the dark colored debris
and the associated thick, pasty material. I counseled the patient to
be sure to wash the nipple with soap and water when she bathes
and/or showers.
IMPRESSION: No mammographic evidence of malignancy involving either breast.

Please see above discussion regarding the correlative physical
examination.

RECOMMENDATION:
Screening mammogram in one year as long as the patient has a life
expectancy of 10+ years.(Code:[id number])

I have discussed the findings and recommendations with the patient.
If applicable, a reminder letter will be sent to the patient
regarding the next appointment.

BI-RADS CATEGORY  1: Negative.
